# Patient Record
Sex: Female | Born: 1985 | Race: Black or African American | Hispanic: No | Marital: Single | Smoking: Current every day smoker
Health system: Southern US, Community
[De-identification: ages and names within clinical notes are randomized; demographics above are authoritative.]

## PROBLEM LIST (undated history)

## (undated) ENCOUNTER — Ambulatory Visit (HOSPITAL_COMMUNITY): Discharge status: Absent without leave | Payer: Medicaid Other

## (undated) ENCOUNTER — Emergency Department (HOSPITAL_COMMUNITY): Admission: EM | Payer: Medicaid Other | Source: Home / Self Care

## (undated) DIAGNOSIS — F32A Depression, unspecified: Secondary | ICD-10-CM

## (undated) DIAGNOSIS — C189 Malignant neoplasm of colon, unspecified: Secondary | ICD-10-CM

## (undated) DIAGNOSIS — K8020 Calculus of gallbladder without cholecystitis without obstruction: Secondary | ICD-10-CM

## (undated) DIAGNOSIS — Z1509 Genetic susceptibility to other malignant neoplasm: Secondary | ICD-10-CM

## (undated) DIAGNOSIS — J40 Bronchitis, not specified as acute or chronic: Secondary | ICD-10-CM

## (undated) DIAGNOSIS — Z800 Family history of malignant neoplasm of digestive organs: Secondary | ICD-10-CM

## (undated) DIAGNOSIS — Z801 Family history of malignant neoplasm of trachea, bronchus and lung: Secondary | ICD-10-CM

## (undated) DIAGNOSIS — F319 Bipolar disorder, unspecified: Secondary | ICD-10-CM

## (undated) DIAGNOSIS — I10 Essential (primary) hypertension: Secondary | ICD-10-CM

## (undated) DIAGNOSIS — F329 Major depressive disorder, single episode, unspecified: Secondary | ICD-10-CM

## (undated) DIAGNOSIS — Z808 Family history of malignant neoplasm of other organs or systems: Secondary | ICD-10-CM

## (undated) DIAGNOSIS — F1290 Cannabis use, unspecified, uncomplicated: Secondary | ICD-10-CM

## (undated) DIAGNOSIS — Z720 Tobacco use: Secondary | ICD-10-CM

## (undated) DIAGNOSIS — Z807 Family history of other malignant neoplasms of lymphoid, hematopoietic and related tissues: Secondary | ICD-10-CM

## (undated) HISTORY — PX: DENTAL SURGERY: SHX609

## (undated) HISTORY — DX: Genetic susceptibility to other malignant neoplasm: Z15.09

## (undated) HISTORY — DX: Family history of malignant neoplasm of digestive organs: Z80.0

## (undated) HISTORY — DX: Family history of malignant neoplasm of trachea, bronchus and lung: Z80.1

## (undated) HISTORY — DX: Family history of malignant neoplasm of other organs or systems: Z80.8

## (undated) HISTORY — DX: Family history of other malignant neoplasms of lymphoid, hematopoietic and related tissues: Z80.7

## (undated) HISTORY — DX: Malignant neoplasm of colon, unspecified: C18.9

## (undated) HISTORY — PX: DILATION AND CURETTAGE OF UTERUS: SHX78

---
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
## 2003-07-01 ENCOUNTER — Emergency Department (HOSPITAL_COMMUNITY): Admission: EM | Admit: 2003-07-01 | Discharge: 2003-07-02 | Payer: Self-pay | Admitting: Emergency Medicine

## 2003-07-07 ENCOUNTER — Encounter: Admission: RE | Admit: 2003-07-07 | Discharge: 2003-07-07 | Payer: Self-pay | Admitting: Internal Medicine

## 2003-10-07 ENCOUNTER — Emergency Department (HOSPITAL_COMMUNITY): Admission: EM | Admit: 2003-10-07 | Discharge: 2003-10-07 | Payer: Self-pay | Admitting: Emergency Medicine

## 2004-01-11 ENCOUNTER — Inpatient Hospital Stay (HOSPITAL_COMMUNITY): Admission: AD | Admit: 2004-01-11 | Discharge: 2004-01-11 | Payer: Self-pay | Admitting: *Deleted

## 2004-10-31 ENCOUNTER — Inpatient Hospital Stay (HOSPITAL_COMMUNITY): Admission: AD | Admit: 2004-10-31 | Discharge: 2004-10-31 | Payer: Self-pay | Admitting: *Deleted

## 2005-12-19 ENCOUNTER — Inpatient Hospital Stay (HOSPITAL_COMMUNITY): Admission: AD | Admit: 2005-12-19 | Discharge: 2005-12-19 | Payer: Self-pay | Admitting: Obstetrics and Gynecology

## 2005-12-26 ENCOUNTER — Inpatient Hospital Stay (HOSPITAL_COMMUNITY): Admission: AD | Admit: 2005-12-26 | Discharge: 2005-12-26 | Payer: Self-pay | Admitting: Obstetrics and Gynecology

## 2006-05-22 ENCOUNTER — Inpatient Hospital Stay (HOSPITAL_COMMUNITY): Admission: AD | Admit: 2006-05-22 | Discharge: 2006-05-22 | Payer: Self-pay | Admitting: Gynecology

## 2006-08-05 ENCOUNTER — Emergency Department (HOSPITAL_COMMUNITY): Admission: EM | Admit: 2006-08-05 | Discharge: 2006-08-05 | Payer: Self-pay | Admitting: Emergency Medicine

## 2007-06-03 ENCOUNTER — Emergency Department (HOSPITAL_COMMUNITY): Admission: EM | Admit: 2007-06-03 | Discharge: 2007-06-03 | Payer: Self-pay | Admitting: Emergency Medicine

## 2007-11-11 ENCOUNTER — Inpatient Hospital Stay (HOSPITAL_COMMUNITY): Admission: AD | Admit: 2007-11-11 | Discharge: 2007-11-11 | Payer: Self-pay | Admitting: Obstetrics & Gynecology

## 2008-06-04 ENCOUNTER — Emergency Department (HOSPITAL_COMMUNITY): Admission: EM | Admit: 2008-06-04 | Discharge: 2008-06-05 | Payer: Self-pay | Admitting: Emergency Medicine

## 2009-02-16 ENCOUNTER — Inpatient Hospital Stay (HOSPITAL_COMMUNITY): Admission: AD | Admit: 2009-02-16 | Discharge: 2009-02-16 | Payer: Self-pay | Admitting: Obstetrics & Gynecology

## 2009-02-18 ENCOUNTER — Inpatient Hospital Stay (HOSPITAL_COMMUNITY): Admission: AD | Admit: 2009-02-18 | Discharge: 2009-02-18 | Payer: Self-pay | Admitting: Family Medicine

## 2009-05-31 ENCOUNTER — Ambulatory Visit (HOSPITAL_COMMUNITY): Admission: RE | Admit: 2009-05-31 | Discharge: 2009-05-31 | Payer: Self-pay | Admitting: Obstetrics

## 2009-08-11 ENCOUNTER — Ambulatory Visit (HOSPITAL_COMMUNITY): Admission: RE | Admit: 2009-08-11 | Discharge: 2009-08-11 | Payer: Self-pay | Admitting: Obstetrics

## 2009-10-07 ENCOUNTER — Inpatient Hospital Stay (HOSPITAL_COMMUNITY): Admission: AD | Admit: 2009-10-07 | Discharge: 2009-10-07 | Payer: Self-pay | Admitting: Obstetrics & Gynecology

## 2009-10-13 ENCOUNTER — Inpatient Hospital Stay (HOSPITAL_COMMUNITY): Admission: AD | Admit: 2009-10-13 | Discharge: 2009-10-15 | Payer: Self-pay | Admitting: Obstetrics & Gynecology

## 2009-10-13 ENCOUNTER — Inpatient Hospital Stay (HOSPITAL_COMMUNITY): Admission: AD | Admit: 2009-10-13 | Discharge: 2009-10-13 | Payer: Self-pay | Admitting: Obstetrics

## 2010-07-10 ENCOUNTER — Emergency Department (HOSPITAL_COMMUNITY): Admission: EM | Admit: 2010-07-10 | Discharge: 2010-07-10 | Payer: Self-pay | Admitting: Family Medicine

## 2011-03-12 ENCOUNTER — Inpatient Hospital Stay (HOSPITAL_COMMUNITY): Admission: RE | Admit: 2011-03-12 | Payer: Medicaid Other | Source: Ambulatory Visit

## 2011-03-12 ENCOUNTER — Inpatient Hospital Stay (INDEPENDENT_AMBULATORY_CARE_PROVIDER_SITE_OTHER)
Admission: RE | Admit: 2011-03-12 | Discharge: 2011-03-12 | Disposition: A | Discharge status: Home / Self Care | Payer: Self-pay | Source: Ambulatory Visit | Attending: Emergency Medicine | Admitting: Emergency Medicine

## 2011-03-12 DIAGNOSIS — N390 Urinary tract infection, site not specified: Secondary | ICD-10-CM

## 2011-03-12 LAB — POCT URINALYSIS DIP (DEVICE)
Bilirubin Urine: NEGATIVE
Ketones, ur: NEGATIVE mg/dL
Specific Gravity, Urine: 1.025 (ref 1.005–1.030)
pH: 6 (ref 5.0–8.0)

## 2011-03-14 LAB — URINE CULTURE
Colony Count: >=100000
Culture  Setup Time: 201203262110

## 2011-03-22 LAB — CBC
HCT: 26 % — ABNORMAL LOW (ref 36.0–46.0)
HCT: 29.9 % — ABNORMAL LOW (ref 36.0–46.0)
Hemoglobin: 10.1 g/dL — ABNORMAL LOW (ref 12.0–15.0)
MCHC: 33.1 g/dL (ref 30.0–36.0)
MCHC: 33.8 g/dL (ref 30.0–36.0)
MCV: 84.9 fL (ref 78.0–100.0)
Platelets: 270 10*3/uL (ref 150–400)
Platelets: 389 10*3/uL (ref 150–400)
RBC: 3.9 MIL/uL (ref 3.87–5.11)
RDW: 12.7 % (ref 11.5–15.5)
RDW: 13.1 % (ref 11.5–15.5)
WBC: 12.5 10*3/uL — ABNORMAL HIGH (ref 4.0–10.5)

## 2011-03-22 LAB — URINALYSIS, ROUTINE W REFLEX MICROSCOPIC
Bilirubin Urine: NEGATIVE
Nitrite: NEGATIVE
Protein, ur: NEGATIVE mg/dL
Specific Gravity, Urine: 1.015 (ref 1.005–1.030)
Urobilinogen, UA: 0.2 mg/dL (ref 0.0–1.0)

## 2011-03-22 LAB — COMPREHENSIVE METABOLIC PANEL
Alkaline Phosphatase: 139 U/L — ABNORMAL HIGH (ref 39–117)
BUN: 2 mg/dL — ABNORMAL LOW (ref 6–23)
CO2: 20 mEq/L (ref 19–32)
Calcium: 8.9 mg/dL (ref 8.4–10.5)
GFR calc non Af Amer: >60 mL/min (ref >60)
Glucose, Bld: 81 mg/dL (ref 70–99)
Total Protein: 6.6 g/dL (ref 6.0–8.3)

## 2011-03-22 LAB — RPR: RPR Ser Ql: NONREACTIVE

## 2011-03-22 LAB — LACTATE DEHYDROGENASE: LDH: 86 U/L — ABNORMAL LOW (ref 94–250)

## 2011-03-22 LAB — URIC ACID: Uric Acid, Serum: 4.5 mg/dL (ref 2.4–7.0)

## 2011-03-29 LAB — URINALYSIS, ROUTINE W REFLEX MICROSCOPIC
Nitrite: NEGATIVE
Protein, ur: NEGATIVE mg/dL
Specific Gravity, Urine: >1.03 — ABNORMAL HIGH (ref 1.005–1.030)
Urobilinogen, UA: 0.2 mg/dL (ref 0.0–1.0)

## 2011-03-29 LAB — CBC
HCT: 39.7 % (ref 36.0–46.0)
Hemoglobin: 13.2 g/dL (ref 12.0–15.0)
MCHC: 33.2 g/dL (ref 30.0–36.0)
RBC: 4.55 MIL/uL (ref 3.87–5.11)
RDW: 12.6 % (ref 11.5–15.5)

## 2011-03-29 LAB — GC/CHLAMYDIA PROBE AMP, GENITAL
Chlamydia, DNA Probe: NEGATIVE
GC Probe Amp, Genital: NEGATIVE

## 2011-03-29 LAB — WET PREP, GENITAL: Clue Cells Wet Prep HPF POC: NONE SEEN

## 2011-03-29 LAB — POCT PREGNANCY, URINE: Preg Test, Ur: POSITIVE

## 2011-03-29 LAB — ABO/RH: ABO/RH(D): O POS

## 2011-08-06 ENCOUNTER — Inpatient Hospital Stay (HOSPITAL_COMMUNITY)
Admission: AD | Admit: 2011-08-06 | Discharge: 2011-08-06 | Disposition: A | Discharge status: Home / Self Care | Payer: Self-pay | Source: Ambulatory Visit | Attending: Obstetrics | Admitting: Obstetrics

## 2011-09-13 LAB — POCT PREGNANCY, URINE
Operator id: 272551
Preg Test, Ur: NEGATIVE

## 2011-09-25 LAB — URINALYSIS, ROUTINE W REFLEX MICROSCOPIC
Glucose, UA: NEGATIVE
Hgb urine dipstick: NEGATIVE
Protein, ur: NEGATIVE

## 2011-09-25 LAB — URINE MICROSCOPIC-ADD ON

## 2011-09-25 LAB — POCT PREGNANCY, URINE: Preg Test, Ur: NEGATIVE

## 2011-09-25 LAB — WET PREP, GENITAL

## 2011-09-25 LAB — GC/CHLAMYDIA PROBE AMP, GENITAL: Chlamydia, DNA Probe: NEGATIVE

## 2011-10-03 LAB — STREP A DNA PROBE: Group A Strep Probe: NEGATIVE

## 2011-10-03 LAB — RAPID STREP SCREEN (MED CTR MEBANE ONLY): Streptococcus, Group A Screen (Direct): NEGATIVE

## 2012-11-19 ENCOUNTER — Emergency Department (HOSPITAL_COMMUNITY)
Admission: EM | Admit: 2012-11-19 | Discharge: 2012-11-19 | Disposition: A | Discharge status: Home / Self Care | Payer: Self-pay | Attending: Emergency Medicine | Admitting: Emergency Medicine

## 2012-11-19 ENCOUNTER — Encounter (HOSPITAL_COMMUNITY): Payer: Self-pay

## 2012-11-19 DIAGNOSIS — R498 Other voice and resonance disorders: Secondary | ICD-10-CM | POA: Insufficient documentation

## 2012-11-19 DIAGNOSIS — F17200 Nicotine dependence, unspecified, uncomplicated: Secondary | ICD-10-CM | POA: Insufficient documentation

## 2012-11-19 DIAGNOSIS — H9209 Otalgia, unspecified ear: Secondary | ICD-10-CM | POA: Insufficient documentation

## 2012-11-19 DIAGNOSIS — J029 Acute pharyngitis, unspecified: Secondary | ICD-10-CM | POA: Insufficient documentation

## 2012-11-19 DIAGNOSIS — B9789 Other viral agents as the cause of diseases classified elsewhere: Secondary | ICD-10-CM

## 2012-11-19 DIAGNOSIS — R51 Headache: Secondary | ICD-10-CM | POA: Insufficient documentation

## 2012-11-19 MED ORDER — HYDROCODONE-ACETAMINOPHEN 7.5-500 MG/15ML PO SOLN: 15.0000 mL | Freq: Four times a day (QID) | ORAL | Status: DC | PRN

## 2012-11-19 NOTE — ED Provider Notes (Signed)
Medical screening examination/treatment/procedure(s) were performed by non-physician practitioner and as supervising physician I was immediately available for consultation/collaboration.  Brayden Brodhead, MD 11/19/12 1707 

## 2012-11-19 NOTE — ED Provider Notes (Signed)
History     CSN: [id number]  Arrival date & time 11/19/12  4098   First MD Initiated Contact with Patient 11/19/12 [phone]      Chief Complaint  Patient presents with  . Otalgia  . Sore Throat    (Consider location/radiation/quality/duration/timing/severity/associated sxs/prior treatment) HPI  26 year old female presents complaining of sore throat.  Patient describes gradual onset of sore throat for the past 2 weeks worse in the past week pain is to both side of throat radiates up to her ears describe pain as a achy throbbing sensation with swallowing.  Pain mildly improved with over-the-counter pain medication and with gargle salt water.  As fever, chills, runny nose, sneezing, cough, chest pain or shortness of breath, abdominal pain or rash.  No dental pain but endorse gum pain.    History reviewed. No pertinent past medical history.  History reviewed. No pertinent past surgical history.  Family History  Problem Relation Age of Onset  . Hypertension Mother     History  Substance Use Topics  . Smoking status: Current Every Day Smoker  . Smokeless tobacco: Never Used  . Alcohol Use: Yes     Comment: weekends only    OB History    Grav Para Term Preterm Abortions TAB SAB Ect Mult Living                  Review of Systems  Constitutional: Negative for fever, chills and appetite change.  HENT: Positive for ear pain, sore throat, trouble swallowing and voice change. Negative for hearing loss, facial swelling, sneezing, drooling, neck pain, neck stiffness, dental problem, sinus pressure, tinnitus and ear discharge.   Respiratory: Negative for chest tightness and shortness of breath.   Cardiovascular: Negative for chest pain.  Gastrointestinal: Negative for abdominal pain.  Skin: Negative for rash.  Neurological: Positive for headaches. Negative for light-headedness.    Allergies  Penicillins  Home Medications   Current Outpatient Rx  Name  Route  Sig  Dispense   Refill  . GABAPENTIN 100 MG PO CAPS   Oral   Take 200 mg by mouth daily.         . QUETIAPINE FUMARATE 400 MG PO TABS   Oral   Take 400 mg by mouth at bedtime.           BP 128/72  Pulse 88  Temp 98.1 F (36.7 C) (Oral)  Resp 18  SpO2 98%  LMP 10/29/2012  Physical Exam  Nursing note and vitals reviewed. Constitutional: She is oriented to person, place, and time. She appears well-developed and well-nourished. No distress.  HENT:  Head: Normocephalic and atraumatic.  Right Ear: External ear normal.  Left Ear: External ear normal.  Nose: Nose normal.  Mouth/Throat: Oropharynx is clear and moist.       tonsilar enlargement with exudates bilaterally.  Uvula midline, no evidence of PTA, ludwigs angina, or deep tissue infection.  No dental pain and gum tenderness.  Voice mildly hoarsed.   Eyes: Conjunctivae normal and EOM are normal. Pupils are equal, round, and reactive to light.  Neck: Normal range of motion. Neck supple.  Cardiovascular: Normal rate and regular rhythm.   Pulmonary/Chest: Effort normal and breath sounds normal. No stridor. No respiratory distress. She has no wheezes. She exhibits no tenderness.  Abdominal: Soft. There is no tenderness.  Musculoskeletal: Normal range of motion.  Lymphadenopathy:    She has no cervical adenopathy.  Neurological: She is alert and oriented to person, place, and time.  Skin: Skin is warm. No rash noted.  Psychiatric: She has a normal mood and affect.    ED Course  Procedures (including critical care time)  Labs Reviewed - No data to display No results found.   No diagnosis found.  Results for orders placed during the hospital encounter of 11/19/12  RAPID STREP SCREEN      Component Value Range   Streptococcus, Group A Screen (Direct) NEGATIVE  NEGATIVE   No results found.  1. Sore throat  MDM  2 weeks onset of sore throats and ear pain.  Throat mildly erythematous with bilateral tonsilar exudates.  No airway  compromise.  Will check rapid strep test.    10:08 AM Strep neg.  Will prescribe Lortab and encourage salt water gargle.  ENT referral given as needed.  Pt otherwise stable for discharge.  BP 128/72  Pulse 88  Temp 98.1 F (36.7 C) (Oral)  Resp 18  SpO2 98%  LMP 10/29/2012  I have reviewed nursing notes and vital signs.  I reviewed available ER/hospitalization records thought the EMR       Fayrene Helper, [place] 11/19/12 1009

## 2012-11-19 NOTE — ED Notes (Signed)
Patient reports that she has redness and c/o sore throat, earache bilaterally. Patient also c/o pain to her gums. Patient also reports that she went to an Urgent Care and was told to consult with a dentist a month ago.

## 2012-11-26 ENCOUNTER — Encounter (HOSPITAL_COMMUNITY): Payer: Self-pay | Admitting: *Deleted

## 2012-11-26 ENCOUNTER — Emergency Department (HOSPITAL_COMMUNITY)
Admission: EM | Admit: 2012-11-26 | Discharge: 2012-11-26 | Disposition: A | Discharge status: Home / Self Care | Payer: Self-pay | Attending: Emergency Medicine | Admitting: Emergency Medicine

## 2012-11-26 DIAGNOSIS — R51 Headache: Secondary | ICD-10-CM | POA: Insufficient documentation

## 2012-11-26 DIAGNOSIS — K089 Disorder of teeth and supporting structures, unspecified: Secondary | ICD-10-CM | POA: Insufficient documentation

## 2012-11-26 DIAGNOSIS — F17200 Nicotine dependence, unspecified, uncomplicated: Secondary | ICD-10-CM | POA: Insufficient documentation

## 2012-11-26 DIAGNOSIS — K0889 Other specified disorders of teeth and supporting structures: Secondary | ICD-10-CM

## 2012-11-26 MED ORDER — HYDROCODONE-ACETAMINOPHEN 5-325 MG PO TABS: 1.0000 | ORAL_TABLET | Freq: Four times a day (QID) | ORAL | Status: DC | PRN

## 2012-11-26 NOTE — ED Notes (Signed)
Pt was just told that she has to have all 4 wisdom tooth pulled but cannot afford it and has been having a headache for one month, but rest of body feels fine

## 2012-11-26 NOTE — ED Provider Notes (Signed)
History     CSN: [id number]  Arrival date & time 11/26/12  3875   First MD Initiated Contact with Patient 11/26/12 0848      Chief Complaint  Patient presents with  . Headache  . Dental Pain    (Consider location/radiation/quality/duration/timing/severity/associated sxs/prior treatment) HPI Comments: This is a 26 year old female, who presents emergency department with chief complaint of dental pain. Patient states that she was recently seen by a dentist and was told that she would need to have her wisdom teeth extracted. Patient states that she cannot afford to have this done, and that it has been causing her mouth pain and headache for one month. She states that the pain is moderate to severe, and is worse when she tries to sleep at night. She has tried taking pain medicine to alleviate her symptoms, with some relief. Nothing makes her symptoms worse or better.  The history is provided by the patient. No language interpreter was used.    History reviewed. No pertinent past medical history.  History reviewed. No pertinent past surgical history.  Family History  Problem Relation Age of Onset  . Hypertension Mother     History  Substance Use Topics  . Smoking status: Current Every Day Smoker  . Smokeless tobacco: Never Used  . Alcohol Use: Yes     Comment: weekends only    OB History    Grav Para Term Preterm Abortions TAB SAB Ect Mult Living                  Review of Systems  All other systems reviewed and are negative.    Allergies  Penicillins  Home Medications   Current Outpatient Rx  Name  Route  Sig  Dispense  Refill  . GABAPENTIN 100 MG PO CAPS   Oral   Take 200 mg by mouth daily.         Marland Kitchen HYDROCODONE-ACETAMINOPHEN 7.5-500 MG/15ML PO SOLN   Oral   Take 15 mLs by mouth every 6 (six) hours as needed for pain.   120 mL   0   . QUETIAPINE FUMARATE 400 MG PO TABS   Oral   Take 400 mg by mouth at bedtime.           BP 147/89  Pulse 85   Temp 98.2 F (36.8 C) (Oral)  Resp 20  SpO2 99%  LMP 10/29/2012  Physical Exam  Nursing note and vitals reviewed. Constitutional: She is oriented to person, place, and time. She appears well-developed and well-nourished.  HENT:  Head: Normocephalic and atraumatic.       Rear molars, are impacting against the neighboring molars, no signs of infection, no tonsillar or peritonsillar abscesses. No signs of Ludwig angina. Airway is intact.  Eyes: Conjunctivae normal and EOM are normal. Pupils are equal, round, and reactive to light.  Neck: Normal range of motion. Neck supple.  Cardiovascular: Normal rate and regular rhythm.  Exam reveals no gallop and no friction rub.   No murmur heard. Pulmonary/Chest: Effort normal and breath sounds normal. No respiratory distress. She has no wheezes. She has no rales. She exhibits no tenderness.  Abdominal: Soft. Bowel sounds are normal. She exhibits no distension and no mass. There is no tenderness. There is no rebound and no guarding.  Musculoskeletal: Normal range of motion. She exhibits no edema and no tenderness.  Neurological: She is alert and oriented to person, place, and time.  Skin: Skin is warm and dry.  Psychiatric: She  has a normal mood and affect. Her behavior is normal. Judgment and thought content normal.    ED Course  Procedures (including critical care time)  Labs Reviewed - No data to display No results found.   1. Pain, dental       MDM  26 year old female with dental pain. I told the patient that she is to followup dentists recommendations and have her wisdom teeth extracted. Have given her a referral to an oral surgeon, as well as the resource guide was doing several chief dental clinics. Advised her to call the dental clinics in any of them will extracted teeth, but told her that she would likely need to see an oral surgeon. I informed her that I would give her pain medicines today, but that she will not be able to  continually returns to the emergency department for pain medicine. She needs to have definitive care for her teeth. She understands and agrees with the plan. Patient is stable and ready for discharge.        Roxy Horseman, PA-C 11/26/12 1132

## 2012-11-26 NOTE — ED Provider Notes (Signed)
Medical screening examination/treatment/procedure(s) were performed by non-physician practitioner and as supervising physician I was immediately available for consultation/collaboration.   Carleene Cooper III, MD 11/26/12 2104

## 2013-01-05 ENCOUNTER — Encounter (HOSPITAL_COMMUNITY): Payer: Self-pay | Admitting: Emergency Medicine

## 2013-01-05 ENCOUNTER — Emergency Department (HOSPITAL_COMMUNITY)
Admission: EM | Admit: 2013-01-05 | Discharge: 2013-01-05 | Disposition: A | Discharge status: Home / Self Care | Payer: Self-pay | Attending: Emergency Medicine | Admitting: Emergency Medicine

## 2013-01-05 ENCOUNTER — Emergency Department (HOSPITAL_COMMUNITY): Payer: Self-pay

## 2013-01-05 DIAGNOSIS — F17200 Nicotine dependence, unspecified, uncomplicated: Secondary | ICD-10-CM | POA: Insufficient documentation

## 2013-01-05 DIAGNOSIS — R109 Unspecified abdominal pain: Secondary | ICD-10-CM | POA: Insufficient documentation

## 2013-01-05 DIAGNOSIS — J029 Acute pharyngitis, unspecified: Secondary | ICD-10-CM | POA: Insufficient documentation

## 2013-01-05 DIAGNOSIS — J069 Acute upper respiratory infection, unspecified: Secondary | ICD-10-CM | POA: Insufficient documentation

## 2013-01-05 DIAGNOSIS — R5383 Other fatigue: Secondary | ICD-10-CM | POA: Insufficient documentation

## 2013-01-05 DIAGNOSIS — R062 Wheezing: Secondary | ICD-10-CM | POA: Insufficient documentation

## 2013-01-05 DIAGNOSIS — Z79899 Other long term (current) drug therapy: Secondary | ICD-10-CM | POA: Insufficient documentation

## 2013-01-05 DIAGNOSIS — F3289 Other specified depressive episodes: Secondary | ICD-10-CM | POA: Insufficient documentation

## 2013-01-05 DIAGNOSIS — R059 Cough, unspecified: Secondary | ICD-10-CM | POA: Insufficient documentation

## 2013-01-05 DIAGNOSIS — R05 Cough: Secondary | ICD-10-CM | POA: Insufficient documentation

## 2013-01-05 DIAGNOSIS — Z3202 Encounter for pregnancy test, result negative: Secondary | ICD-10-CM | POA: Insufficient documentation

## 2013-01-05 DIAGNOSIS — R5381 Other malaise: Secondary | ICD-10-CM | POA: Insufficient documentation

## 2013-01-05 DIAGNOSIS — F329 Major depressive disorder, single episode, unspecified: Secondary | ICD-10-CM | POA: Insufficient documentation

## 2013-01-05 HISTORY — DX: Depression, unspecified: F32.A

## 2013-01-05 HISTORY — DX: Major depressive disorder, single episode, unspecified: F32.9

## 2013-01-05 LAB — URINALYSIS, MICROSCOPIC ONLY
Glucose, UA: NEGATIVE mg/dL
Hgb urine dipstick: NEGATIVE
Nitrite: NEGATIVE
Specific Gravity, Urine: 1.021 (ref 1.005–1.030)

## 2013-01-05 LAB — PREGNANCY, URINE: Preg Test, Ur: NEGATIVE

## 2013-01-05 LAB — CBC WITH DIFFERENTIAL/PLATELET
Basophils Absolute: 0 10*3/uL (ref 0.0–0.1)
Eosinophils Absolute: 0.1 10*3/uL (ref 0.0–0.7)
Eosinophils Relative: 2 % (ref 0–5)
MCH: 28.8 pg (ref 26.0–34.0)
MCHC: 34.1 g/dL (ref 30.0–36.0)
MCV: 84.6 fL (ref 78.0–100.0)
Platelets: 303 10*3/uL (ref 150–400)
RDW: 12.6 % (ref 11.5–15.5)
WBC: 5.3 10*3/uL (ref 4.0–10.5)

## 2013-01-05 LAB — COMPREHENSIVE METABOLIC PANEL
ALT: 17 U/L (ref 0–35)
AST: 20 U/L (ref 0–37)
Calcium: 9.1 mg/dL (ref 8.4–10.5)
GFR calc Af Amer: >90 mL/min (ref >90)
Sodium: 134 mEq/L — ABNORMAL LOW (ref 135–145)
Total Protein: 7.4 g/dL (ref 6.0–8.3)

## 2013-01-05 MED ORDER — BENZONATATE 100 MG PO CAPS: 100.0000 mg | ORAL_CAPSULE | Freq: Three times a day (TID) | ORAL | Status: DC

## 2013-01-05 MED ORDER — ONDANSETRON 8 MG PO TBDP: 8.0000 mg | ORAL_TABLET | Freq: Three times a day (TID) | ORAL | Status: DC | PRN

## 2013-01-05 MED ORDER — ALBUTEROL SULFATE HFA 108 (90 BASE) MCG/ACT IN AERS: 1.0000 | INHALATION_SPRAY | Freq: Four times a day (QID) | RESPIRATORY_TRACT | Status: DC | PRN

## 2013-01-05 MED ORDER — ALBUTEROL SULFATE HFA 108 (90 BASE) MCG/ACT IN AERS
INHALATION_SPRAY | RESPIRATORY_TRACT | Status: AC
  Filled 2013-01-05: qty 6.7

## 2013-01-05 NOTE — ED Notes (Addendum)
Pt presenting to ed with c/o abdominal pain with body aches. Pt denies nausea, vomiting and diarrhea at this time. Pt denies dysuria pt states positive back pain. Pt states positive cough and congestion

## 2013-01-05 NOTE — ED Notes (Signed)
Pt presents to ed with c/o cough and chest congestion as well as lower abdominal pain and vaginal discharge. Pt denies fever, n/v. Expiratory wheezing present bilateral upper lobes.

## 2013-01-05 NOTE — ED Provider Notes (Signed)
History     CSN: [id number]  Arrival date & time 01/05/13  1127   First MD Initiated Contact with Patient 01/05/13 1250      Chief Complaint  Patient presents with  . Generalized Body Aches  . Abdominal Pain    HPI Two days ago pt woke up with coughing and wheezing.  She has been having trouble with wheezing and sore throat.  Her chest is hurting.  It increases with cough.  She has had nasal congestion and rhinitis  A few days prior to that she had some abdominal cramping as well.  No vomiting or diarrhea.    Nothing makes it particularly better or worse. Symptoms are mild to moderate  Past Medical History  Diagnosis Date  . Depression     History reviewed. No pertinent past surgical history.  Family History  Problem Relation Age of Onset  . Hypertension Mother     History  Substance Use Topics  . Smoking status: Current Every Day Smoker -- 0.5 packs/day    Types: Cigarettes  . Smokeless tobacco: Never Used  . Alcohol Use: Yes     Comment: weekends only    OB History    Grav Para Term Preterm Abortions TAB SAB Ect Mult Living                  Review of Systems  Constitutional: Positive for fatigue.  HENT: Negative for ear pain.   Respiratory: Positive for cough.   Genitourinary: Negative for vaginal discharge (Patient denies any significant vaginal discharge, she states she checks for that).  All other systems reviewed and are negative.    Allergies  Penicillins  Home Medications   Current Outpatient Rx  Name  Route  Sig  Dispense  Refill  . GABAPENTIN 100 MG PO CAPS   Oral   Take 200 mg by mouth daily.         Marland Kitchen HYDROCODONE-ACETAMINOPHEN 5-325 MG PO TABS   Oral   Take 1 tablet by mouth every 6 (six) hours as needed for pain.   15 tablet   0   . QUETIAPINE FUMARATE 400 MG PO TABS   Oral   Take 400 mg by mouth at bedtime.           BP 140/67  Pulse 91  Temp 98.7 F (37.1 C) (Oral)  Resp 18  SpO2 100%  Physical Exam  Nursing  note and vitals reviewed. Constitutional: She appears well-developed and well-nourished. No distress.  HENT:  Head: Normocephalic and atraumatic.  Right Ear: External ear normal.  Left Ear: External ear normal.  Mouth/Throat: Posterior oropharyngeal erythema present. No oropharyngeal exudate or posterior oropharyngeal edema.  Eyes: Conjunctivae normal are normal. Right eye exhibits no discharge. Left eye exhibits no discharge. No scleral icterus.  Neck: Neck supple. No tracheal deviation present.  Cardiovascular: Normal rate, regular rhythm and intact distal pulses.   Pulmonary/Chest: Effort normal and breath sounds normal. No stridor. No respiratory distress. She has no wheezes. She has no rales. She exhibits no tenderness.  Abdominal: Soft. Bowel sounds are normal. She exhibits no distension. There is no tenderness. There is no rebound and no guarding.  Musculoskeletal: She exhibits no edema and no tenderness.  Neurological: She is alert. She has normal strength. No sensory deficit. Cranial nerve deficit:  no gross defecits noted. She exhibits normal muscle tone. She displays no seizure activity. Coordination normal.  Skin: Skin is warm and dry. No rash noted.  Psychiatric: She  has a normal mood and affect.    ED Course  Procedures (including critical care time)  Labs Reviewed  CBC WITH DIFFERENTIAL - Abnormal; Notable for the following:    Neutrophils Relative 39 (*)     Monocytes Relative 15 (*)     All other components within normal limits  COMPREHENSIVE METABOLIC PANEL - Abnormal; Notable for the following:    Sodium 134 (*)     Total Bilirubin 0.2 (*)     All other components within normal limits  URINALYSIS, MICROSCOPIC ONLY - Abnormal; Notable for the following:    APPearance CLOUDY (*)     Leukocytes, UA SMALL (*)     Bacteria, UA MANY (*)     Squamous Epithelial / LPF MANY (*)     All other components within normal limits  LIPASE, BLOOD  PREGNANCY, URINE   Dg Chest 2  View  01/05/2013  *RADIOLOGY REPORT*  Clinical Data: Cough  CHEST - 2 VIEW  Comparison: 06/04/2008  Findings: Cardiomediastinal silhouette is stable.  No acute infiltrate or pleural effusion.  No pulmonary edema.  Central mild bronchitic changes.  IMPRESSION: No acute infiltrate or pulmonary edema.  Central mild bronchitic changes.   Original Report Authenticated By: Natasha Mead, M.D.      1. URI, acute       MDM  Patient most likely has a viral illness. She has no evidence of pneumonia on chest x-ray. During my exam I did not auscultate any wheezing nursing had noted earlier.  Patient had been complaining of some abdominal cramping. she has no tenderness on exam. This is most likely related to the viral illness that she is having. I suggested she followup with a primary care Dr. if her symptoms do not resolve.          Celene Kras, MD 01/05/13 [phone]

## 2013-01-05 NOTE — ED Notes (Signed)
Called lab to check on delay, they said UA should be done within next 5 min.

## 2013-01-07 LAB — URINE CULTURE: Colony Count: 30000

## 2013-02-26 ENCOUNTER — Encounter (HOSPITAL_COMMUNITY): Payer: Self-pay | Admitting: *Deleted

## 2013-02-26 ENCOUNTER — Inpatient Hospital Stay (HOSPITAL_COMMUNITY)
Admission: AD | Admit: 2013-02-26 | Discharge: 2013-02-27 | Disposition: A | Discharge status: Home / Self Care | Payer: Self-pay | Source: Ambulatory Visit | Attending: Obstetrics and Gynecology | Admitting: Obstetrics and Gynecology

## 2013-02-26 DIAGNOSIS — M545 Low back pain, unspecified: Secondary | ICD-10-CM | POA: Insufficient documentation

## 2013-02-26 DIAGNOSIS — N72 Inflammatory disease of cervix uteri: Secondary | ICD-10-CM | POA: Insufficient documentation

## 2013-02-26 DIAGNOSIS — R109 Unspecified abdominal pain: Secondary | ICD-10-CM | POA: Insufficient documentation

## 2013-02-26 DIAGNOSIS — N949 Unspecified condition associated with female genital organs and menstrual cycle: Secondary | ICD-10-CM | POA: Insufficient documentation

## 2013-02-26 LAB — URINALYSIS, ROUTINE W REFLEX MICROSCOPIC
Bilirubin Urine: NEGATIVE
Hgb urine dipstick: NEGATIVE
Protein, ur: NEGATIVE mg/dL
Urobilinogen, UA: 0.2 mg/dL (ref 0.0–1.0)

## 2013-02-26 MED ORDER — KETOROLAC TROMETHAMINE 60 MG/2ML IM SOLN
60.0000 mg | Freq: Once | INTRAMUSCULAR | Status: AC
  Administered 2013-02-27: 60 mg via INTRAMUSCULAR
  Filled 2013-02-26: qty 2

## 2013-02-26 NOTE — MAU Note (Signed)
Pt states she has pressure and discomfort abover her pubic bone and discomfort with urinating and vaginal odor and also wants to be checked for STDS

## 2013-02-26 NOTE — MAU Provider Note (Signed)
History     CSN: [id number]  Arrival date and time: 02/26/13 3086   First Provider Initiated Contact with Patient 02/26/13 2351      Chief Complaint  Patient presents with  . Abdominal Pain   HPI  Pt is not pregnant and presents with abdominal  Pain and pressure with lower back pain along with feeling frequency of urination every 30-27min for 3 days, voiding qs. The pressure has been about 3 weeks.  She has RCM. Pt states she has a vaginal discharge with odor- no itching or burning; pt denies fever or chills, constipation or diarrhea. Pt is concerned about STDs  Past Medical History  Diagnosis Date  . Depression     Past Surgical History  Procedure Laterality Date  . Dilation and curettage of uterus      Family History  Problem Relation Age of Onset  . Hypertension Mother     History  Substance Use Topics  . Smoking status: Current Every Day Smoker -- 0.50 packs/day    Types: Cigarettes  . Smokeless tobacco: Never Used  . Alcohol Use: Yes     Comment: weekends only    Allergies:  Allergies  Allergen Reactions  . Penicillins Other (See Comments)    Boils     Prescriptions prior to admission  Medication Sig Dispense Refill  . albuterol (PROVENTIL HFA;VENTOLIN HFA) 108 (90 BASE) MCG/ACT inhaler Inhale 1-2 puffs into the lungs every 6 (six) hours as needed for wheezing.  1 Inhaler  0  . gabapentin (NEURONTIN) 100 MG capsule Take 200 mg by mouth daily.      . QUEtiapine (SEROQUEL) 400 MG tablet Take 400 mg by mouth at bedtime.      . benzonatate (TESSALON) 100 MG capsule Take 1 capsule (100 mg total) by mouth every 8 (eight) hours.  21 capsule  0  . HYDROcodone-acetaminophen (NORCO/VICODIN) 5-325 MG per tablet Take 1 tablet by mouth every 6 (six) hours as needed for pain.  15 tablet  0  . ondansetron (ZOFRAN ODT) 8 MG disintegrating tablet Take 1 tablet (8 mg total) by mouth every 8 (eight) hours as needed for nausea.  20 tablet  0    Review of Systems   Constitutional: Negative for fever and chills.  Gastrointestinal: Positive for abdominal pain. Negative for diarrhea and constipation.  Genitourinary: Positive for frequency. Negative for dysuria and urgency.   Physical Exam   Blood pressure 146/94, pulse 82, temperature 98.1 F (36.7 C), temperature source Oral, resp. rate 20, height 5\' 3"  (1.6 m), weight 213 lb 6 oz (96.786 kg), last menstrual period 02/13/2013, SpO2 100.00%.  Physical Exam  Nursing note and vitals reviewed. Constitutional: She is oriented to person, place, and time. She appears well-developed and well-nourished.  HENT:  Head: Normocephalic.  Neck: Normal range of motion. Neck supple.  Cardiovascular: Normal rate.   Respiratory: Effort normal.  GI: Soft. She exhibits no distension. There is no tenderness. There is no rebound and no guarding.  Genitourinary:  Mod amount of frothy white discharge in vault; cervix tender +CMT; bimanual diffusely tender without palpable enlargement  Musculoskeletal: Normal range of motion.  Neurological: She is alert and oriented to person, place, and time.  Skin: Skin is warm and dry.  Psychiatric: She has a normal mood and affect.    MAU Course  Procedures  Results for orders placed during the hospital encounter of 02/26/13 (from the past 24 hour(s))  URINALYSIS, ROUTINE W REFLEX MICROSCOPIC     Status:  Abnormal   Collection Time    02/26/13  8:30 PM      Result Value Range   Color, Urine YELLOW  YELLOW   APPearance CLEAR  CLEAR   Specific Gravity, Urine >1.030 (*) 1.005 - 1.030   pH 5.5  5.0 - 8.0   Glucose, UA NEGATIVE  NEGATIVE mg/dL   Hgb urine dipstick NEGATIVE  NEGATIVE   Bilirubin Urine NEGATIVE  NEGATIVE   Ketones, ur NEGATIVE  NEGATIVE mg/dL   Protein, ur NEGATIVE  NEGATIVE mg/dL   Urobilinogen, UA 0.2  0.0 - 1.0 mg/dL   Nitrite NEGATIVE  NEGATIVE   Leukocytes, UA NEGATIVE  NEGATIVE  POCT PREGNANCY, URINE     Status: None   Collection Time    02/26/13  8:49  PM      Result Value Range   Preg Test, Ur NEGATIVE  NEGATIVE   Pt left before her labs available- pt to call back for results of wet prep Lab unable to process wet prep Pt treated for cervicitis while in MAU GC/Chlamydia pending- pt to be notified if positive Assessment and Plan  Cervicitis- treated in MAU with Rocephin and Zithromax   Amy Yoder 02/26/2013, 11:51 PM

## 2013-02-27 ENCOUNTER — Telehealth (HOSPITAL_COMMUNITY): Payer: Self-pay | Admitting: *Deleted

## 2013-02-27 MED ORDER — AZITHROMYCIN 250 MG PO TABS
1000.0000 mg | ORAL_TABLET | Freq: Once | ORAL | Status: AC
  Administered 2013-02-27: 1000 mg via ORAL
  Filled 2013-02-27: qty 4

## 2013-02-27 MED ORDER — CEFTRIAXONE SODIUM 250 MG IJ SOLR
250.0000 mg | Freq: Once | INTRAMUSCULAR | Status: AC
  Administered 2013-02-27: 250 mg via INTRAMUSCULAR
  Filled 2013-02-27: qty 250

## 2013-05-21 ENCOUNTER — Encounter (HOSPITAL_COMMUNITY): Payer: Self-pay | Admitting: Emergency Medicine

## 2013-05-21 ENCOUNTER — Emergency Department (HOSPITAL_COMMUNITY): Payer: Self-pay

## 2013-05-21 ENCOUNTER — Emergency Department (HOSPITAL_COMMUNITY)
Admission: EM | Admit: 2013-05-21 | Discharge: 2013-05-21 | Disposition: A | Discharge status: Home / Self Care | Payer: Self-pay | Attending: Emergency Medicine | Admitting: Emergency Medicine

## 2013-05-21 DIAGNOSIS — Z79899 Other long term (current) drug therapy: Secondary | ICD-10-CM | POA: Insufficient documentation

## 2013-05-21 DIAGNOSIS — R109 Unspecified abdominal pain: Secondary | ICD-10-CM | POA: Insufficient documentation

## 2013-05-21 DIAGNOSIS — Z3202 Encounter for pregnancy test, result negative: Secondary | ICD-10-CM | POA: Insufficient documentation

## 2013-05-21 DIAGNOSIS — Z8709 Personal history of other diseases of the respiratory system: Secondary | ICD-10-CM | POA: Insufficient documentation

## 2013-05-21 DIAGNOSIS — R0602 Shortness of breath: Secondary | ICD-10-CM | POA: Insufficient documentation

## 2013-05-21 DIAGNOSIS — F329 Major depressive disorder, single episode, unspecified: Secondary | ICD-10-CM | POA: Insufficient documentation

## 2013-05-21 DIAGNOSIS — F17200 Nicotine dependence, unspecified, uncomplicated: Secondary | ICD-10-CM | POA: Insufficient documentation

## 2013-05-21 DIAGNOSIS — F3289 Other specified depressive episodes: Secondary | ICD-10-CM | POA: Insufficient documentation

## 2013-05-21 HISTORY — DX: Bronchitis, not specified as acute or chronic: J40

## 2013-05-21 LAB — CBC WITH DIFFERENTIAL/PLATELET
Eosinophils Relative: 3 % (ref 0–5)
HCT: 39 % (ref 36.0–46.0)
Lymphocytes Relative: 39 % (ref 12–46)
Lymphs Abs: 2.3 10*3/uL (ref 0.7–4.0)
MCV: 82.1 fL (ref 78.0–100.0)
Monocytes Absolute: 0.8 10*3/uL (ref 0.1–1.0)
RBC: 4.75 MIL/uL (ref 3.87–5.11)
WBC: 5.7 10*3/uL (ref 4.0–10.5)

## 2013-05-21 LAB — URINALYSIS, ROUTINE W REFLEX MICROSCOPIC
Glucose, UA: NEGATIVE mg/dL
Hgb urine dipstick: NEGATIVE
Specific Gravity, Urine: 1.018 (ref 1.005–1.030)
pH: 5 (ref 5.0–8.0)

## 2013-05-21 LAB — COMPREHENSIVE METABOLIC PANEL
ALT: 13 U/L (ref 0–35)
CO2: 21 mEq/L (ref 19–32)
Calcium: 9.1 mg/dL (ref 8.4–10.5)
Chloride: 103 mEq/L (ref 96–112)
GFR calc Af Amer: >90 mL/min (ref >90)
GFR calc non Af Amer: >90 mL/min (ref >90)
Glucose, Bld: 94 mg/dL (ref 70–99)
Sodium: 136 mEq/L (ref 135–145)
Total Bilirubin: 0.4 mg/dL (ref 0.3–1.2)

## 2013-05-21 LAB — URINE MICROSCOPIC-ADD ON

## 2013-05-21 LAB — POCT I-STAT TROPONIN I: Troponin i, poc: 0 ng/mL (ref 0.00–0.08)

## 2013-05-21 MED ORDER — METHOCARBAMOL 500 MG PO TABS: 500.0000 mg | ORAL_TABLET | Freq: Two times a day (BID) | ORAL | Status: DC

## 2013-05-21 MED ORDER — IBUPROFEN 600 MG PO TABS: 600.0000 mg | ORAL_TABLET | Freq: Four times a day (QID) | ORAL | Status: DC | PRN

## 2013-05-21 MED ORDER — IBUPROFEN 800 MG PO TABS
800.0000 mg | ORAL_TABLET | Freq: Once | ORAL | Status: AC
  Administered 2013-05-21: 800 mg via ORAL
  Filled 2013-05-21: qty 1

## 2013-05-21 NOTE — ED Provider Notes (Signed)
History     CSN: [id number]  Arrival date & time 05/21/13  4098   First MD Initiated Contact with Patient 05/21/13 [phone]      Chief Complaint  Patient presents with  . Abdominal Pain  . Shortness of Breath    (Consider location/radiation/quality/duration/timing/severity/associated sxs/prior treatment) Patient is a 27 y.o. female presenting with abdominal pain and shortness of breath. The history is provided by the patient.  Abdominal Pain Associated symptoms include abdominal pain and shortness of breath.  Shortness of Breath Associated symptoms: abdominal pain   pt here with right sided flank pain x 2-3 months--sx have been colicky and are worse with movement and sometimes with eating--no fever, chills, vomiting, diarrhea--no dysuria or hematuria--no tx used pta--pt not seen by provider for same --no rashes  Past Medical History  Diagnosis Date  . Depression   . Bronchitis     Past Surgical History  Procedure Laterality Date  . Dilation and curettage of uterus    . Dental surgery      Family History  Problem Relation Age of Onset  . Hypertension Mother     History  Substance Use Topics  . Smoking status: Current Every Day Smoker -- 0.50 packs/day    Types: Cigarettes  . Smokeless tobacco: Never Used  . Alcohol Use: Yes     Comment: 2x/week    OB History   Grav Para Term Preterm Abortions TAB SAB Ect Mult Living   4 1   3 2 1   1       Review of Systems  Respiratory: Positive for shortness of breath.   Gastrointestinal: Positive for abdominal pain.  All other systems reviewed and are negative.    Allergies  Penicillins  Home Medications   Current Outpatient Rx  Name  Route  Sig  Dispense  Refill  . albuterol (PROVENTIL HFA;VENTOLIN HFA) 108 (90 BASE) MCG/ACT inhaler   Inhalation   Inhale 1-2 puffs into the lungs every 6 (six) hours as needed for wheezing.   1 Inhaler   0   . gabapentin (NEURONTIN) 100 MG capsule   Oral   Take 200 mg by mouth  daily.         . QUEtiapine (SEROQUEL) 400 MG tablet   Oral   Take 400 mg by mouth at bedtime.           BP 144/72  Pulse 106  Temp(Src) 98.8 F (37.1 C) (Oral)  Resp 17  SpO2 97%  LMP 05/02/2013  Physical Exam  Nursing note and vitals reviewed. Constitutional: She is oriented to person, place, and time. She appears well-developed and well-nourished.  Non-toxic appearance. No distress.  HENT:  Head: Normocephalic and atraumatic.  Eyes: Conjunctivae, EOM and lids are normal. Pupils are equal, round, and reactive to light.  Neck: Normal range of motion. Neck supple. No tracheal deviation present. No mass present.  Cardiovascular: Normal rate, regular rhythm and normal heart sounds.  Exam reveals no gallop.   No murmur heard. Pulmonary/Chest: Effort normal and breath sounds normal. No stridor. No respiratory distress. She has no decreased breath sounds. She has no wheezes. She has no rhonchi. She has no rales.  Abdominal: Soft. Normal appearance and bowel sounds are normal. She exhibits no distension. There is no tenderness. There is no rebound and no CVA tenderness.  Musculoskeletal: Normal range of motion. She exhibits no edema and no tenderness.       Arms: Neurological: She is alert and oriented to person, place,  and time. She has normal strength. No cranial nerve deficit or sensory deficit. GCS eye subscore is 4. GCS verbal subscore is 5. GCS motor subscore is 6.  Skin: Skin is warm and dry. No abrasion and no rash noted.  Psychiatric: She has a normal mood and affect. Her speech is normal and behavior is normal.    ED Course  Procedures (including critical care time)  Labs Reviewed  URINALYSIS, ROUTINE W REFLEX MICROSCOPIC  CBC WITH DIFFERENTIAL  COMPREHENSIVE METABOLIC PANEL  LIPASE, BLOOD  POCT PREGNANCY, URINE   No results found.   No diagnosis found.    MDM   Date: 05/21/2013  Rate: 88  Rhythm: normal sinus rhythm  QRS Axis: normal  Intervals:  normal  ST/T Wave abnormalities: normal  Conduction Disutrbances:none  Narrative Interpretation:   Old EKG Reviewed: none available  Patient's work appears been negative. She is stable for discharge.        Toy Baker, MD 05/21/13 [phone]

## 2013-05-21 NOTE — ED Notes (Signed)
Pt states that she has abd pain that has been going on for couple of months and yesterday when she woke up the pain was really bad.  Pt also c/o Shob times one week when she "gets excited or talks". Pt states that she was seen couple months ago and had bronchitis.

## 2013-05-21 NOTE — ED Notes (Signed)
Ultrasound at bedside

## 2013-05-21 NOTE — Progress Notes (Signed)
[facility] has seen patient and provided her with with a oc application and a list of primary care resources.

## 2013-05-21 NOTE — ED Notes (Signed)
Attempted times 2 to start IV and obtain blood specimens, but unsuccessful.

## 2013-07-13 ENCOUNTER — Inpatient Hospital Stay (HOSPITAL_COMMUNITY)
Admission: AD | Admit: 2013-07-13 | Discharge: 2013-07-13 | Disposition: A | Discharge status: Home / Self Care | Payer: Self-pay | Source: Ambulatory Visit | Attending: Family Medicine | Admitting: Family Medicine

## 2013-07-13 DIAGNOSIS — B9689 Other specified bacterial agents as the cause of diseases classified elsewhere: Secondary | ICD-10-CM | POA: Insufficient documentation

## 2013-07-13 DIAGNOSIS — N760 Acute vaginitis: Secondary | ICD-10-CM | POA: Insufficient documentation

## 2013-07-13 DIAGNOSIS — R109 Unspecified abdominal pain: Secondary | ICD-10-CM | POA: Insufficient documentation

## 2013-07-13 DIAGNOSIS — K219 Gastro-esophageal reflux disease without esophagitis: Secondary | ICD-10-CM | POA: Insufficient documentation

## 2013-07-13 DIAGNOSIS — A499 Bacterial infection, unspecified: Secondary | ICD-10-CM

## 2013-07-13 DIAGNOSIS — N949 Unspecified condition associated with female genital organs and menstrual cycle: Secondary | ICD-10-CM | POA: Insufficient documentation

## 2013-07-13 LAB — URINALYSIS, ROUTINE W REFLEX MICROSCOPIC
Glucose, UA: NEGATIVE mg/dL
Leukocytes, UA: NEGATIVE
Nitrite: NEGATIVE
Specific Gravity, Urine: >1.03 — ABNORMAL HIGH (ref 1.005–1.030)
pH: 5.5 (ref 5.0–8.0)

## 2013-07-13 LAB — WET PREP, GENITAL
Trich, Wet Prep: NONE SEEN
Yeast Wet Prep HPF POC: NONE SEEN

## 2013-07-13 MED ORDER — FAMOTIDINE 20 MG PO TABS
40.0000 mg | ORAL_TABLET | Freq: Once | ORAL | Status: AC
  Administered 2013-07-13: 40 mg via ORAL
  Filled 2013-07-13: qty 2

## 2013-07-13 MED ORDER — OMEPRAZOLE 20 MG PO CPDR: 20.0000 mg | DELAYED_RELEASE_CAPSULE | Freq: Every day | ORAL | Status: DC

## 2013-07-13 MED ORDER — METRONIDAZOLE 500 MG PO TABS: 500.0000 mg | ORAL_TABLET | Freq: Two times a day (BID) | ORAL | Status: DC

## 2013-07-13 NOTE — MAU Provider Note (Signed)
History     CSN: [id number]  Arrival date and time: 07/13/13 1249   First Provider Initiated Contact with Patient 07/13/13 1420      Chief Complaint  Patient presents with  . Abdominal Pain  . Vaginal Discharge   HPI [name] is a 27 y.o. [phone] who presents to MAU for upper abdominal pain and vaginal discharge. The patient states that she has had upper abdominal pain x 3 months. She rates her pain at 9/10 at worst. Pain is worse after eating and relieved by Ibuprofen. She denies N/V/D, chest pain, palpitation, SOB or fever. She does have occasional constipation. She has a history of GERD and is not on medications. She has been having a clear vaginal discharge with fishy odor x months. She denies vaginal bleeding or irritation today. She denies UTI symptoms, but states a "funny feeling" at the end of her stream occasionally.   OB History   Grav Para Term Preterm Abortions TAB SAB Ect Mult Living   4 1   3 2 1   1       Past Medical History  Diagnosis Date  . Depression   . Bronchitis     Past Surgical History  Procedure Laterality Date  . Dilation and curettage of uterus    . Dental surgery      Family History  Problem Relation Age of Onset  . Hypertension Mother     History  Substance Use Topics  . Smoking status: Current Every Day Smoker -- 0.50 packs/day    Types: Cigarettes  . Smokeless tobacco: Never Used  . Alcohol Use: Yes     Comment: 2x/week    Allergies:  Allergies  Allergen Reactions  . Penicillins Other (See Comments)    Boils     Prescriptions prior to admission  Medication Sig Dispense Refill  . gabapentin (NEURONTIN) 100 MG capsule Take 200 mg by mouth daily.      Marland Kitchen ibuprofen (ADVIL,MOTRIN) 600 MG tablet Take 1 tablet (600 mg total) by mouth every 6 (six) hours as needed for pain.  30 tablet  0  . QUEtiapine (SEROQUEL) 400 MG tablet Take 400 mg by mouth at bedtime.        Review of Systems  Constitutional: Negative for fever  and malaise/fatigue.  Cardiovascular: Negative for chest pain and palpitations.  Gastrointestinal: Positive for heartburn, abdominal pain and constipation. Negative for nausea, vomiting and diarrhea.  Genitourinary: Negative for dysuria, urgency and frequency.       Neg - vaginal bleeding + vaginal discharge   Physical Exam   Blood pressure 103/81, pulse 86, temperature 98.8 F (37.1 C), temperature source Oral, resp. rate 18, height 5\' 3"  (1.6 m), weight 212 lb (96.163 kg), last menstrual period 07/02/2013, SpO2 97.00%.  Physical Exam  Constitutional: She is oriented to person, place, and time. She appears well-developed and well-nourished. No distress.  HENT:  Head: Normocephalic and atraumatic.  Cardiovascular: Normal rate, regular rhythm and normal heart sounds.   Respiratory: Effort normal and breath sounds normal. No respiratory distress.  GI: Soft. Bowel sounds are normal. She exhibits no distension and no mass. There is tenderness (mild lower abdominal tenderness to palpation). There is no rebound and no guarding.  Genitourinary: Uterus is not enlarged and not tender. Cervix exhibits friability. Cervix exhibits no motion tenderness and no discharge. Right adnexum displays tenderness (mild). Right adnexum displays no mass. Left adnexum displays tenderness. Left adnexum displays no mass. No bleeding around the vagina. Vaginal  discharge (moderate amount of thick, white discharge noted) found.  Neurological: She is alert and oriented to person, place, and time.  Skin: Skin is warm and dry. No erythema.  Psychiatric: She has a normal mood and affect.   Results for orders placed during the hospital encounter of 07/13/13 (from the past 24 hour(s))  URINALYSIS, ROUTINE W REFLEX MICROSCOPIC     Status: Abnormal   Collection Time    07/13/13  1:05 PM      Result Value Range   Color, Urine YELLOW  YELLOW   APPearance HAZY (*) CLEAR   Specific Gravity, Urine >1.030 (*) 1.005 - 1.030   pH  5.5  5.0 - 8.0   Glucose, UA NEGATIVE  NEGATIVE mg/dL   Hgb urine dipstick NEGATIVE  NEGATIVE   Bilirubin Urine NEGATIVE  NEGATIVE   Ketones, ur NEGATIVE  NEGATIVE mg/dL   Protein, ur NEGATIVE  NEGATIVE mg/dL   Urobilinogen, UA 0.2  0.0 - 1.0 mg/dL   Nitrite NEGATIVE  NEGATIVE   Leukocytes, UA NEGATIVE  NEGATIVE  POCT PREGNANCY, URINE     Status: None   Collection Time    07/13/13  1:28 PM      Result Value Range   Preg Test, Ur NEGATIVE  NEGATIVE    MAU Course  Procedures None  MDM UPT - negative UA, Wet prep, GC/Chlamydia today Pepcid given in MAU today Assessment and Plan  A: Bacterial vaginosis GERD  P: Discharge home Rx for Flagyl and Prilosec sent to patient's pharmacy AVS contains information about GERD diet Patient advised to follow-up with PCP for GERD, may need GI referral Patient may return to MAU as needed  Freddi Starr, PA-C  07/13/2013, 2:20 PM

## 2013-07-13 NOTE — MAU Provider Note (Signed)
Chart reviewed and agree with management and plan.  

## 2013-07-13 NOTE — MAU Note (Signed)
Patient is in with c/o upper abdominal sharp pains and clear, odorous vaginal discharge for months now. She denies vaginal bleeding. She states that the abdominal pain gets worse after eating at times. Denies fever, nausea or vomiting. lmp 07/02/13

## 2013-10-10 ENCOUNTER — Encounter (HOSPITAL_COMMUNITY): Payer: Self-pay | Admitting: Emergency Medicine

## 2013-10-10 ENCOUNTER — Emergency Department (HOSPITAL_COMMUNITY)
Admission: EM | Admit: 2013-10-10 | Discharge: 2013-10-10 | Disposition: A | Discharge status: Home / Self Care | Payer: Self-pay | Attending: Emergency Medicine | Admitting: Emergency Medicine

## 2013-10-10 DIAGNOSIS — F17200 Nicotine dependence, unspecified, uncomplicated: Secondary | ICD-10-CM | POA: Insufficient documentation

## 2013-10-10 DIAGNOSIS — Z79899 Other long term (current) drug therapy: Secondary | ICD-10-CM | POA: Insufficient documentation

## 2013-10-10 DIAGNOSIS — Z8709 Personal history of other diseases of the respiratory system: Secondary | ICD-10-CM | POA: Insufficient documentation

## 2013-10-10 DIAGNOSIS — F329 Major depressive disorder, single episode, unspecified: Secondary | ICD-10-CM | POA: Insufficient documentation

## 2013-10-10 DIAGNOSIS — K5900 Constipation, unspecified: Secondary | ICD-10-CM | POA: Insufficient documentation

## 2013-10-10 DIAGNOSIS — K279 Peptic ulcer, site unspecified, unspecified as acute or chronic, without hemorrhage or perforation: Secondary | ICD-10-CM | POA: Insufficient documentation

## 2013-10-10 DIAGNOSIS — F3289 Other specified depressive episodes: Secondary | ICD-10-CM | POA: Insufficient documentation

## 2013-10-10 DIAGNOSIS — Z880 Allergy status to penicillin: Secondary | ICD-10-CM | POA: Insufficient documentation

## 2013-10-10 DIAGNOSIS — K2970 Gastritis, unspecified, without bleeding: Secondary | ICD-10-CM | POA: Insufficient documentation

## 2013-10-10 LAB — CBC WITH DIFFERENTIAL/PLATELET
Eosinophils Absolute: 0.2 10*3/uL (ref 0.0–0.7)
Eosinophils Relative: 2 % (ref 0–5)
HCT: 36.9 % (ref 36.0–46.0)
Hemoglobin: 13 g/dL (ref 12.0–15.0)
Lymphs Abs: 4.8 10*3/uL — ABNORMAL HIGH (ref 0.7–4.0)
MCH: 29.5 pg (ref 26.0–34.0)
MCV: 83.9 fL (ref 78.0–100.0)
Monocytes Absolute: 0.7 10*3/uL (ref 0.1–1.0)
Monocytes Relative: 8 % (ref 3–12)
RBC: 4.4 MIL/uL (ref 3.87–5.11)

## 2013-10-10 LAB — COMPREHENSIVE METABOLIC PANEL
Alkaline Phosphatase: 74 U/L (ref 39–117)
BUN: 11 mg/dL (ref 6–23)
GFR calc Af Amer: >90 mL/min (ref >90)
Glucose, Bld: 86 mg/dL (ref 70–99)
Potassium: 3.4 mEq/L — ABNORMAL LOW (ref 3.5–5.1)
Total Protein: 7.7 g/dL (ref 6.0–8.3)

## 2013-10-10 LAB — HCG, SERUM, QUALITATIVE: Preg, Serum: NEGATIVE

## 2013-10-10 MED ORDER — GI COCKTAIL ~~LOC~~
30.0000 mL | Freq: Once | ORAL | Status: AC
  Administered 2013-10-10: 30 mL via ORAL
  Filled 2013-10-10: qty 30

## 2013-10-10 MED ORDER — HYDROCODONE-ACETAMINOPHEN 5-325 MG PO TABS: 1.0000 | ORAL_TABLET | ORAL | Status: DC | PRN

## 2013-10-10 MED ORDER — OMEPRAZOLE 20 MG PO CPDR: 20.0000 mg | DELAYED_RELEASE_CAPSULE | Freq: Two times a day (BID) | ORAL | Status: DC

## 2013-10-10 MED ORDER — PANTOPRAZOLE SODIUM 40 MG IV SOLR
40.0000 mg | Freq: Once | INTRAVENOUS | Status: AC
  Administered 2013-10-10: 40 mg via INTRAVENOUS
  Filled 2013-10-10: qty 40

## 2013-10-10 MED ORDER — SUCRALFATE 1 G PO TABS: 1.0000 g | ORAL_TABLET | Freq: Four times a day (QID) | ORAL | Status: DC

## 2013-10-10 MED ORDER — ONDANSETRON HCL 4 MG PO TABS: 4.0000 mg | ORAL_TABLET | Freq: Four times a day (QID) | ORAL | Status: DC

## 2013-10-10 MED ORDER — ONDANSETRON HCL 4 MG/2ML IJ SOLN
4.0000 mg | Freq: Once | INTRAMUSCULAR | Status: AC
  Administered 2013-10-10: 4 mg via INTRAVENOUS
  Filled 2013-10-10: qty 2

## 2013-10-10 NOTE — ED Notes (Signed)
Pt states she is having abdominal pain, was diagnosed with stomach ulcers but did not make it back to the doctor to get any pain medication. Also reports no normal BM's in a month, states she has been giving herself enemas to have a BM, two yesterday with the second having a small BM afterwards. C/O nausea no vomiting.

## 2013-10-10 NOTE — ED Provider Notes (Signed)
CSN: [id number]     Arrival date & time 10/10/13  1905 History   First MD Initiated Contact with Patient 10/10/13 1909     Chief Complaint  Patient presents with  . Abdominal Pain    HPI  Patient presents with a few days history of epigastric abdominal pain. She states in the past he says the discomfort that she may have had ulcers. Never been formally evaluate her seen by a physician. Since the big meals and greasy foods or spicy foods will cause your symptoms. The last 2 days have been or worse symptoms, however she has had symptoms really for about 4-5 weeks. About a week into her symptoms she started taking Motrin. She normally drinks coffee "most of the day". She works at [employer]. She does not use alcohol. She does smoke.  No black or bloody stools. Nausea no vomiting.  Past Medical History  Diagnosis Date  . Depression   . Bronchitis    Past Surgical History  Procedure Laterality Date  . Dilation and curettage of uterus    . Dental surgery     Family History  Problem Relation Age of Onset  . Hypertension Mother    History  Substance Use Topics  . Smoking status: Current Every Day Smoker -- 0.50 packs/day    Types: Cigarettes  . Smokeless tobacco: Never Used  . Alcohol Use: Yes     Comment: 2x/week   OB History   Grav Para Term Preterm Abortions TAB SAB Ect Mult Living   4 1   3 2 1   1      Review of Systems  Constitutional: Negative for fever, chills, diaphoresis, appetite change and fatigue.  HENT: Negative for mouth sores, sore throat and trouble swallowing.   Eyes: Negative for visual disturbance.  Respiratory: Negative for cough, chest tightness, shortness of breath and wheezing.   Cardiovascular: Negative for chest pain.  Gastrointestinal: Positive for nausea, vomiting, abdominal pain and constipation. Negative for diarrhea, blood in stool, abdominal distention and anal bleeding.  Endocrine: Negative for polydipsia, polyphagia and polyuria.  Genitourinary:  Negative for dysuria, frequency and hematuria.  Musculoskeletal: Negative for gait problem.  Skin: Negative for color change, pallor and rash.  Neurological: Negative for dizziness, syncope, light-headedness and headaches.  Hematological: Does not bruise/bleed easily.  Psychiatric/Behavioral: Negative for behavioral problems and confusion.    Allergies  Penicillins  Home Medications   Current Outpatient Rx  Name  Route  Sig  Dispense  Refill  . gabapentin (NEURONTIN) 100 MG capsule   Oral   Take 200 mg by mouth daily.         . QUEtiapine (SEROQUEL) 400 MG tablet   Oral   Take 400 mg by mouth at bedtime.         Marland Kitchen HYDROcodone-acetaminophen (NORCO/VICODIN) 5-325 MG per tablet   Oral   Take 1 tablet by mouth every 4 (four) hours as needed for pain.   10 tablet   0   . omeprazole (PRILOSEC) 20 MG capsule   Oral   Take 1 capsule (20 mg total) by mouth 2 (two) times daily.   60 capsule   0   . ondansetron (ZOFRAN) 4 MG tablet   Oral   Take 1 tablet (4 mg total) by mouth every 6 (six) hours.   12 tablet   0   . sucralfate (CARAFATE) 1 G tablet   Oral   Take 1 tablet (1 g total) by mouth 4 (four) times daily.  60 tablet   0    BP 138/76  Pulse 83  Temp(Src) 99.2 F (37.3 C) (Oral)  SpO2 99%  LMP 09/17/2013 Physical Exam  Constitutional: She is oriented to person, place, and time. She appears well-developed and well-nourished. No distress.  HENT:  Head: Normocephalic.  Eyes: Conjunctivae are normal. Pupils are equal, round, and reactive to light. No scleral icterus.  Neck: Normal range of motion. Neck supple. No thyromegaly present.  Cardiovascular: Normal rate and regular rhythm.  Exam reveals no gallop and no friction rub.   No murmur heard. Pulmonary/Chest: Effort normal and breath sounds normal. No respiratory distress. She has no wheezes. She has no rales.  Abdominal: Soft. Bowel sounds are normal. She exhibits no distension. There is no tenderness.  There is no rebound.  Mild epigastric tenderness. No guarding no rebound no peritoneal irritation.  No right upper quadrant tenderness negative Murphy's sign. No lower abdominal tenderness.   Musculoskeletal: Normal range of motion.  Neurological: She is alert and oriented to person, place, and time.  Skin: Skin is warm and dry. No rash noted.  Psychiatric: She has a normal mood and affect. Her behavior is normal.    ED Course  Procedures (including critical care time) Labs Review Labs Reviewed  CBC WITH DIFFERENTIAL - Abnormal; Notable for the following:    Neutrophils Relative % 34 (*)    Lymphocytes Relative 55 (*)    Lymphs Abs 4.8 (*)    All other components within normal limits  COMPREHENSIVE METABOLIC PANEL - Abnormal; Notable for the following:    Potassium 3.4 (*)    Total Bilirubin 0.1 (*)    All other components within normal limits  LIPASE, BLOOD  HCG, SERUM, QUALITATIVE   Imaging Review No results found.  EKG Interpretation   None       MDM   1. Gastritis   2. Peptic ulcer disease    Hepatobiliary or pancreatic enzymes are normal. Has multiple risk factors for gastritis exam and history consistent with gastritis or peptic ulcer disease I discussed with her at length the treatment. Discussed avoiding alcohol tobacco caffeine anti-inflammatories.    Roney Marion, MD 10/10/13 2214

## 2013-10-10 NOTE — ED Notes (Signed)
Pt reports she has been having back and abd pain for a couple of weeks. Reports that she has been diagnosed with stomach ulcers. Reports some SOB related to the pain that started this afternoon. Denies any nausea or vomiting. Reports some constipation.

## 2013-10-26 ENCOUNTER — Emergency Department (HOSPITAL_COMMUNITY): Payer: Self-pay

## 2013-10-26 ENCOUNTER — Emergency Department (HOSPITAL_COMMUNITY)
Admission: EM | Admit: 2013-10-26 | Discharge: 2013-10-26 | Disposition: A | Discharge status: Home / Self Care | Payer: Self-pay | Attending: Emergency Medicine | Admitting: Emergency Medicine

## 2013-10-26 ENCOUNTER — Encounter (HOSPITAL_COMMUNITY): Payer: Self-pay | Admitting: Emergency Medicine

## 2013-10-26 DIAGNOSIS — R195 Other fecal abnormalities: Secondary | ICD-10-CM | POA: Insufficient documentation

## 2013-10-26 DIAGNOSIS — R05 Cough: Secondary | ICD-10-CM | POA: Insufficient documentation

## 2013-10-26 DIAGNOSIS — R0602 Shortness of breath: Secondary | ICD-10-CM | POA: Insufficient documentation

## 2013-10-26 DIAGNOSIS — R110 Nausea: Secondary | ICD-10-CM | POA: Insufficient documentation

## 2013-10-26 DIAGNOSIS — R1013 Epigastric pain: Secondary | ICD-10-CM | POA: Insufficient documentation

## 2013-10-26 DIAGNOSIS — Z8709 Personal history of other diseases of the respiratory system: Secondary | ICD-10-CM | POA: Insufficient documentation

## 2013-10-26 DIAGNOSIS — Z79899 Other long term (current) drug therapy: Secondary | ICD-10-CM | POA: Insufficient documentation

## 2013-10-26 DIAGNOSIS — F17200 Nicotine dependence, unspecified, uncomplicated: Secondary | ICD-10-CM | POA: Insufficient documentation

## 2013-10-26 DIAGNOSIS — F3289 Other specified depressive episodes: Secondary | ICD-10-CM | POA: Insufficient documentation

## 2013-10-26 DIAGNOSIS — Z3202 Encounter for pregnancy test, result negative: Secondary | ICD-10-CM | POA: Insufficient documentation

## 2013-10-26 DIAGNOSIS — R059 Cough, unspecified: Secondary | ICD-10-CM | POA: Insufficient documentation

## 2013-10-26 DIAGNOSIS — F329 Major depressive disorder, single episode, unspecified: Secondary | ICD-10-CM | POA: Insufficient documentation

## 2013-10-26 DIAGNOSIS — Z880 Allergy status to penicillin: Secondary | ICD-10-CM | POA: Insufficient documentation

## 2013-10-26 LAB — URINALYSIS, ROUTINE W REFLEX MICROSCOPIC
Bilirubin Urine: NEGATIVE
Glucose, UA: NEGATIVE mg/dL
Ketones, ur: NEGATIVE mg/dL
Leukocytes, UA: NEGATIVE
Protein, ur: NEGATIVE mg/dL
Urobilinogen, UA: 0.2 mg/dL (ref 0.0–1.0)

## 2013-10-26 LAB — CBC WITH DIFFERENTIAL/PLATELET
Basophils Absolute: 0 10*3/uL (ref 0.0–0.1)
Basophils Relative: 1 % (ref 0–1)
Hemoglobin: 12.7 g/dL (ref 12.0–15.0)
MCHC: 34.1 g/dL (ref 30.0–36.0)
Monocytes Relative: 6 % (ref 3–12)
Neutro Abs: 2.4 10*3/uL (ref 1.7–7.7)
Neutrophils Relative %: 39 % — ABNORMAL LOW (ref 43–77)
RDW: 12.1 % (ref 11.5–15.5)

## 2013-10-26 LAB — COMPREHENSIVE METABOLIC PANEL
ALT: 12 U/L (ref 0–35)
AST: 14 U/L (ref 0–37)
Albumin: 3.6 g/dL (ref 3.5–5.2)
Alkaline Phosphatase: 68 U/L (ref 39–117)
Chloride: 105 mEq/L (ref 96–112)
Potassium: 3.8 mEq/L (ref 3.5–5.1)
Sodium: 136 mEq/L (ref 135–145)
Total Bilirubin: 0.3 mg/dL (ref 0.3–1.2)

## 2013-10-26 LAB — POCT I-STAT TROPONIN I: Troponin i, poc: 0 ng/mL (ref 0.00–0.08)

## 2013-10-26 MED ORDER — FAMOTIDINE 20 MG PO TABS: 20.0000 mg | ORAL_TABLET | Freq: Two times a day (BID) | ORAL | Status: DC

## 2013-10-26 MED ORDER — GI COCKTAIL ~~LOC~~
30.0000 mL | Freq: Once | ORAL | Status: AC
  Administered 2013-10-26: 30 mL via ORAL
  Filled 2013-10-26: qty 30

## 2013-10-26 MED ORDER — OMEPRAZOLE 20 MG PO CPDR: 20.0000 mg | DELAYED_RELEASE_CAPSULE | Freq: Every day | ORAL | Status: DC

## 2013-10-26 NOTE — ED Provider Notes (Signed)
CSN: [id number]     Arrival date & time 10/26/13  1223 History   First MD Initiated Contact with Patient 10/26/13 1314     Chief Complaint  Patient presents with  . Abdominal Pain  . Shortness of Breath   (Consider location/radiation/quality/duration/timing/severity/associated sxs/prior Treatment) The history is provided by the patient.   Pt presents with epigastric pain that has been ongoing for one month and is unchanged.  States she was diagnosed with an ulcer and filled the prescription for hydrocodone but none of the other prescriptions.  States the pain is aching, worse after meals and at the end of the day.  She continues to take ibuprofen 2 pills PO TID as well as fiber pills.  Is having formed green stools - no black or bloody stools.  Occasional nausea without vomiting.  Has tingling sensation after emptying her bladder x 1 week.  Denies urinary frequency or urgency, denies fevers, chills, CP, SOB, denies abnormal vaginal discharge or bleeding or menstural irregularity. Has had dry cough x 2 weeks, is concerned she has bronchitis.   Past Medical History  Diagnosis Date  . Depression   . Bronchitis    Past Surgical History  Procedure Laterality Date  . Dilation and curettage of uterus    . Dental surgery     Family History  Problem Relation Age of Onset  . Hypertension Mother    History  Substance Use Topics  . Smoking status: Current Every Day Smoker -- 0.50 packs/day    Types: Cigarettes  . Smokeless tobacco: Never Used  . Alcohol Use: Yes     Comment: 2x/week   OB History   Grav Para Term Preterm Abortions TAB SAB Ect Mult Living   4 1   3 2 1   1      Review of Systems  Constitutional: Negative for fever and chills.  HENT: Negative for sore throat.   Respiratory: Positive for cough. Negative for shortness of breath.   Cardiovascular: Negative for chest pain.  Gastrointestinal: Positive for nausea and abdominal pain. Negative for vomiting, diarrhea,  constipation and blood in stool.  Genitourinary: Negative for dysuria, urgency, frequency, vaginal bleeding and vaginal discharge.    Allergies  Penicillins  Home Medications   Current Outpatient Rx  Name  Route  Sig  Dispense  Refill  . HYDROcodone-acetaminophen (NORCO/VICODIN) 5-325 MG per tablet   Oral   Take 1 tablet by mouth every 4 (four) hours as needed for pain.   10 tablet   0   . ibuprofen (ADVIL,MOTRIN) 200 MG tablet   Oral   Take 400 mg by mouth every 6 (six) hours as needed.         Marland Kitchen QUEtiapine (SEROQUEL) 400 MG tablet   Oral   Take 400 mg by mouth at bedtime.         . famotidine (PEPCID) 20 MG tablet   Oral   Take 1 tablet (20 mg total) by mouth 2 (two) times daily.   60 tablet   0   . omeprazole (PRILOSEC) 20 MG capsule   Oral   Take 1 capsule (20 mg total) by mouth daily.   30 capsule   0    BP 126/62  Pulse 71  Temp(Src) 98.3 F (36.8 C) (Oral)  Resp 20  SpO2 100%  LMP 10/17/2013 Physical Exam  Nursing note and vitals reviewed. Constitutional: She appears well-developed and well-nourished. No distress.  HENT:  Head: Normocephalic and atraumatic.  Neck: Neck supple.  Cardiovascular: Normal rate and regular rhythm.   Pulmonary/Chest: Effort normal and breath sounds normal. No respiratory distress. She has no wheezes. She has no rales.  Abdominal: Soft. She exhibits no distension. There is no tenderness. There is no rebound and no guarding.  Neurological: She is alert.  Skin: She is not diaphoretic.    ED Course  Procedures (including critical care time) Labs Review Labs Reviewed  CBC WITH DIFFERENTIAL - Abnormal; Notable for the following:    Neutrophils Relative % 39 (*)    Lymphocytes Relative 52 (*)    All other components within normal limits  COMPREHENSIVE METABOLIC PANEL - Abnormal; Notable for the following:    Glucose, Bld 124 (*)    All other components within normal limits  URINALYSIS, ROUTINE W REFLEX MICROSCOPIC -  Abnormal; Notable for the following:    APPearance CLOUDY (*)    All other components within normal limits  LIPASE, BLOOD  POCT I-STAT TROPONIN I  POCT PREGNANCY, URINE   Imaging Review Dg Chest 2 View  10/26/2013   CLINICAL DATA:  Dyspnea with history of tobacco use and bronchitis  EXAM: CHEST  2 VIEW  COMPARISON:  May 21, 2013.  FINDINGS: The lungs are adequately inflated. There is no focal infiltrate. The cardiac silhouette is normal in size. The pulmonary vascularity is not engorged. There is no pleural effusion or pneumothorax. The mediastinum is normal in width. The observed portions of the bony thorax are normal.  IMPRESSION: There is no evidence of acute pneumonia. I cannot exclude acute bronchitis in the appropriate clinical setting.   Electronically Signed   By: David  [place]   On: 10/26/2013 15:17    EKG Interpretation   None       MDM   1. Epigastric pain   2. Cough    Pt with persistent epigastric pain that is unchanged x 1 month.  Worse after eating.  Likely reflux vs PUD.  Pt encouraged to stop chronic NSAID use, stop smoking.  Pt also advised to take PPI vs H2 blocker, she was unable to afford the last prescription and could not find OTC possibility, I have given her prescription for two different options and have encouraged her to take them, discussed how to take them.  Pt also c/o cough without SOB or CP, cough has been persistent x 2 weeks.  CXR negative.  Lungs CTAB.  Labs, UA unremarkable. D/C home with PCP, GI follow up. Discussed results, findings, treatment, and follow up  with patient.  Pt given return precautions.  Pt verbalizes understanding and agrees with plan.      I doubt any other EMC precluding discharge at this time including, but not necessarily limited to the following: pneumonia, ruptured viscus, cholecystitis.     Trixie Dredge, PA-C 10/27/13 [phone]

## 2013-10-26 NOTE — ED Notes (Signed)
Pt also c/o nausea and states she vomited last night. Pt states she has constipation.

## 2013-10-26 NOTE — Progress Notes (Signed)
[facility] provided pt with a list of primary care resources and a [facility] [employer] [facility] application.  °

## 2013-10-26 NOTE — ED Notes (Signed)
Pt states she has been having abd pain for months and was told she has ulcers.  Pt states that she also has shob and chest pain for two weeks.

## 2013-10-26 NOTE — ED Notes (Signed)
Patient is aware that we need urine, PA or MD have been in room when we have tried to get a specimen

## 2013-10-27 NOTE — ED Provider Notes (Signed)
Medical screening examination/treatment/procedure(s) were performed by non-physician practitioner and as supervising physician I was immediately available for consultation/collaboration.  EKG Interpretation   None         William Alda Gaultney, MD 10/27/13 1521 

## 2014-03-11 ENCOUNTER — Observation Stay (HOSPITAL_COMMUNITY)
Admission: EM | Admit: 2014-03-11 | Discharge: 2014-03-13 | Disposition: A | Discharge status: Home / Self Care | Payer: Self-pay | Attending: General Surgery | Admitting: General Surgery

## 2014-03-11 ENCOUNTER — Encounter (HOSPITAL_COMMUNITY): Payer: Self-pay | Admitting: Emergency Medicine

## 2014-03-11 ENCOUNTER — Emergency Department (HOSPITAL_COMMUNITY): Payer: Self-pay

## 2014-03-11 DIAGNOSIS — Z79899 Other long term (current) drug therapy: Secondary | ICD-10-CM | POA: Insufficient documentation

## 2014-03-11 DIAGNOSIS — F1210 Cannabis abuse, uncomplicated: Secondary | ICD-10-CM | POA: Insufficient documentation

## 2014-03-11 DIAGNOSIS — K8010 Calculus of gallbladder with chronic cholecystitis without obstruction: Secondary | ICD-10-CM

## 2014-03-11 DIAGNOSIS — F17200 Nicotine dependence, unspecified, uncomplicated: Secondary | ICD-10-CM | POA: Insufficient documentation

## 2014-03-11 DIAGNOSIS — R1110 Vomiting, unspecified: Secondary | ICD-10-CM

## 2014-03-11 DIAGNOSIS — E669 Obesity, unspecified: Secondary | ICD-10-CM | POA: Insufficient documentation

## 2014-03-11 DIAGNOSIS — K8020 Calculus of gallbladder without cholecystitis without obstruction: Secondary | ICD-10-CM

## 2014-03-11 DIAGNOSIS — F3130 Bipolar disorder, current episode depressed, mild or moderate severity, unspecified: Secondary | ICD-10-CM | POA: Insufficient documentation

## 2014-03-11 HISTORY — DX: Tobacco use: Z72.0

## 2014-03-11 HISTORY — DX: Cannabis use, unspecified, uncomplicated: F12.90

## 2014-03-11 HISTORY — DX: Calculus of gallbladder without cholecystitis without obstruction: K80.20

## 2014-03-11 HISTORY — DX: Bipolar disorder, unspecified: F31.9

## 2014-03-11 LAB — CBC WITH DIFFERENTIAL/PLATELET
BASOS PCT: 0 % (ref 0–1)
Basophils Absolute: 0 10*3/uL (ref 0.0–0.1)
Eosinophils Absolute: 0.1 10*3/uL (ref 0.0–0.7)
Eosinophils Relative: 2 % (ref 0–5)
HCT: 38.2 % (ref 36.0–46.0)
HEMOGLOBIN: 13.2 g/dL (ref 12.0–15.0)
Lymphocytes Relative: 53 % — ABNORMAL HIGH (ref 12–46)
Lymphs Abs: 3.6 10*3/uL (ref 0.7–4.0)
MCH: 28.8 pg (ref 26.0–34.0)
MCHC: 34.6 g/dL (ref 30.0–36.0)
MCV: 83.2 fL (ref 78.0–100.0)
MONOS PCT: 11 % (ref 3–12)
Monocytes Absolute: 0.7 10*3/uL (ref 0.1–1.0)
NEUTROS PCT: 35 % — AB (ref 43–77)
Neutro Abs: 2.3 10*3/uL (ref 1.7–7.7)
Platelets: 310 10*3/uL (ref 150–400)
RBC: 4.59 MIL/uL (ref 3.87–5.11)
RDW: 12.5 % (ref 11.5–15.5)
WBC: 6.8 10*3/uL (ref 4.0–10.5)

## 2014-03-11 LAB — URINALYSIS, ROUTINE W REFLEX MICROSCOPIC
Bilirubin Urine: NEGATIVE
Glucose, UA: NEGATIVE mg/dL
HGB URINE DIPSTICK: NEGATIVE
Ketones, ur: NEGATIVE mg/dL
Nitrite: NEGATIVE
PH: 5.5 (ref 5.0–8.0)
Protein, ur: NEGATIVE mg/dL
SPECIFIC GRAVITY, URINE: 1.017 (ref 1.005–1.030)
Urobilinogen, UA: 0.2 mg/dL (ref 0.0–1.0)

## 2014-03-11 LAB — COMPREHENSIVE METABOLIC PANEL
ALBUMIN: 3.9 g/dL (ref 3.5–5.2)
ALK PHOS: 72 U/L (ref 39–117)
ALT: 14 U/L (ref 0–35)
AST: 14 U/L (ref 0–37)
BUN: 9 mg/dL (ref 6–23)
CHLORIDE: 107 meq/L (ref 96–112)
CO2: 18 mEq/L — ABNORMAL LOW (ref 19–32)
Calcium: 9.3 mg/dL (ref 8.4–10.5)
Creatinine, Ser: 0.74 mg/dL (ref 0.50–1.10)
GFR calc Af Amer: >90 mL/min (ref >90)
GFR calc non Af Amer: >90 mL/min (ref >90)
Glucose, Bld: 108 mg/dL — ABNORMAL HIGH (ref 70–99)
Potassium: 4.3 mEq/L (ref 3.7–5.3)
SODIUM: 138 meq/L (ref 137–147)
Total Protein: 7.7 g/dL (ref 6.0–8.3)

## 2014-03-11 LAB — URINE MICROSCOPIC-ADD ON

## 2014-03-11 LAB — SURGICAL PCR SCREEN
MRSA, PCR: NEGATIVE
Staphylococcus aureus: NEGATIVE

## 2014-03-11 LAB — POC URINE PREG, ED: Preg Test, Ur: NEGATIVE

## 2014-03-11 LAB — LIPASE, BLOOD: LIPASE: 20 U/L (ref 11–59)

## 2014-03-11 MED ORDER — QUETIAPINE FUMARATE 400 MG PO TABS
400.0000 mg | ORAL_TABLET | Freq: Every day | ORAL | Status: DC
  Administered 2014-03-11 – 2014-03-12 (×2): 400 mg via ORAL
  Filled 2014-03-11 (×4): qty 1

## 2014-03-11 MED ORDER — ONDANSETRON HCL 4 MG/2ML IJ SOLN: 4.0000 mg | Freq: Four times a day (QID) | INTRAMUSCULAR | Status: DC | PRN

## 2014-03-11 MED ORDER — KCL IN DEXTROSE-NACL 20-5-0.9 MEQ/L-%-% IV SOLN
INTRAVENOUS | Status: DC
  Administered 2014-03-11 – 2014-03-13 (×5): via INTRAVENOUS
  Filled 2014-03-11 (×5): qty 1000

## 2014-03-11 MED ORDER — NICOTINE 14 MG/24HR TD PT24
14.0000 mg | MEDICATED_PATCH | Freq: Every day | TRANSDERMAL | Status: DC
  Administered 2014-03-11 – 2014-03-12 (×2): 14 mg via TRANSDERMAL
  Filled 2014-03-11 (×3): qty 1

## 2014-03-11 MED ORDER — CIPROFLOXACIN IN D5W 400 MG/200ML IV SOLN
400.0000 mg | Freq: Two times a day (BID) | INTRAVENOUS | Status: DC
  Administered 2014-03-11 – 2014-03-13 (×4): 400 mg via INTRAVENOUS
  Filled 2014-03-11 (×4): qty 200

## 2014-03-11 MED ORDER — SODIUM CHLORIDE 0.9 % IV BOLUS (SEPSIS)
1000.0000 mL | Freq: Once | INTRAVENOUS | Status: AC
  Administered 2014-03-11: 1000 mL via INTRAVENOUS

## 2014-03-11 MED ORDER — MORPHINE SULFATE 4 MG/ML IJ SOLN
4.0000 mg | Freq: Once | INTRAMUSCULAR | Status: AC
  Administered 2014-03-11: 4 mg via INTRAVENOUS
  Filled 2014-03-11: qty 1

## 2014-03-11 MED ORDER — HYDROCODONE-ACETAMINOPHEN 5-325 MG PO TABS
1.0000 | ORAL_TABLET | ORAL | Status: DC | PRN
  Administered 2014-03-11 – 2014-03-13 (×6): 2 via ORAL
  Filled 2014-03-11: qty 2
  Filled 2014-03-11: qty 1
  Filled 2014-03-11 (×4): qty 2
  Filled 2014-03-11: qty 1

## 2014-03-11 MED ORDER — HEPARIN SODIUM (PORCINE) 5000 UNIT/ML IJ SOLN
5000.0000 [IU] | Freq: Three times a day (TID) | INTRAMUSCULAR | Status: AC
  Administered 2014-03-11: 5000 [IU] via SUBCUTANEOUS
  Filled 2014-03-11: qty 1

## 2014-03-11 MED ORDER — ACETAMINOPHEN 650 MG RE SUPP
650.0000 mg | Freq: Four times a day (QID) | RECTAL | Status: DC | PRN
  Administered 2014-03-12: 650 mg via RECTAL
  Filled 2014-03-11: qty 1

## 2014-03-11 MED ORDER — ONDANSETRON HCL 4 MG/2ML IJ SOLN
4.0000 mg | Freq: Once | INTRAMUSCULAR | Status: AC
  Administered 2014-03-11: 4 mg via INTRAVENOUS
  Filled 2014-03-11: qty 2

## 2014-03-11 MED ORDER — DIPHENHYDRAMINE HCL 50 MG/ML IJ SOLN: 12.5000 mg | Freq: Four times a day (QID) | INTRAMUSCULAR | Status: DC | PRN

## 2014-03-11 MED ORDER — MORPHINE SULFATE 2 MG/ML IJ SOLN
1.0000 mg | INTRAMUSCULAR | Status: DC | PRN
  Administered 2014-03-12: 2 mg via INTRAVENOUS
  Administered 2014-03-12 (×2): 3 mg via INTRAVENOUS
  Administered 2014-03-12: 2 mg via INTRAVENOUS
  Filled 2014-03-11 (×2): qty 1
  Filled 2014-03-11 (×2): qty 2

## 2014-03-11 MED ORDER — DIPHENHYDRAMINE HCL 12.5 MG/5ML PO ELIX: 12.5000 mg | ORAL_SOLUTION | Freq: Four times a day (QID) | ORAL | Status: DC | PRN

## 2014-03-11 MED ORDER — GABAPENTIN 300 MG PO CAPS
600.0000 mg | ORAL_CAPSULE | Freq: Every morning | ORAL | Status: DC
  Administered 2014-03-12: 600 mg via ORAL
  Filled 2014-03-11 (×2): qty 2

## 2014-03-11 MED ORDER — ACETAMINOPHEN 325 MG PO TABS: 650.0000 mg | ORAL_TABLET | Freq: Four times a day (QID) | ORAL | Status: DC | PRN

## 2014-03-11 MED ORDER — FAMOTIDINE 20 MG PO TABS
20.0000 mg | ORAL_TABLET | Freq: Two times a day (BID) | ORAL | Status: DC
  Administered 2014-03-11 – 2014-03-12 (×3): 20 mg via ORAL
  Filled 2014-03-11 (×5): qty 1

## 2014-03-11 NOTE — ED Notes (Signed)
Bedside ultrasound in progress.

## 2014-03-11 NOTE — H&P (Signed)
Amy Yoder is an 28 y.o. female.   Chief Complaint: abdominal pain, nausea and some vomiting. HPI: Pt has a history of pain, intermittent nausea and vomiting with meals on and off for 6 months.  She says it was usually worst with eating.  She say for the last week she has not been able to eat or drink much.  She has ongoing pain RUQ. She currently does not complain of pain here, but she did with US exam.  She presented to the ER today because of recurrent pain nausea and some vomiting.  US shows:  The gallbladder is contracted and demonstrates distal shadowing.  There may be stones present. Of note is the fact that the patient ingested imaged a.m. was approximately 2 hr ago. Which could have resulted in some emptying of the gallbladder. However, there is a  positive sonographic Murphy's sign.  2. The remainder the examination is within the limits of normal.  She does report some cereal and M&M's this AM.  Labs are normal.  She complains of ongoing pain.  She has had multiple prior evaluations in the ED's with abdominal pain.  We were ask to see.   Past Medical History  Diagnosis Date  . Depression   . Bronchitis   . Bipolar I disorder   . Tobacco use   . Marijuana use     Past Surgical History  Procedure Laterality Date  . Dilation and curettage of uterus    . Dental surgery      Family History  Problem Relation Age of Onset  . Hypertension Mother    Social History:  reports that she has been smoking Cigarettes.  She has been smoking about 0.50 packs per day. She has never used smokeless tobacco. She reports that she drinks alcohol. She reports that she uses illicit drugs. She lives with boyfriend, has 1 daughter, works at [employer], Tobacco:1/2 PPD ETOH:  Occasional Drugs:  none  Allergies:  Allergies  Allergen Reactions  . Penicillins Other (See Comments)    Boils     Prior to Admission medications   Medication Sig Start Date End Date Taking? Authorizing Provider   famotidine (PEPCID) 20 MG tablet Take 1 tablet (20 mg total) by mouth 2 (two) times daily. 10/26/13  Yes Clayton Bibles, PA-C  gabapentin (NEURONTIN) 300 MG capsule Take 600 mg by mouth every morning.   Yes Historical Provider, MD  omeprazole (PRILOSEC) 20 MG capsule Take 1 capsule (20 mg total) by mouth daily. 10/26/13  Yes Clayton Bibles, PA-C  QUEtiapine (SEROQUEL) 400 MG tablet Take 400 mg by mouth at bedtime.   Yes Historical Provider, MD  SERTRALINE HCL PO Take 1 tablet by mouth daily.   Yes Historical Provider, MD     Results for orders placed during the hospital encounter of 03/11/14 (from the past 48 hour(s))  CBC WITH DIFFERENTIAL     Status: Abnormal   Collection Time    03/11/14  1:39 PM      Result Value Ref Range   WBC 6.8  4.0 - 10.5 K/uL   RBC 4.59  3.87 - 5.11 MIL/uL   Hemoglobin 13.2  12.0 - 15.0 g/dL   HCT 38.2  36.0 - 46.0 %   MCV 83.2  78.0 - 100.0 fL   MCH 28.8  26.0 - 34.0 pg   MCHC 34.6  30.0 - 36.0 g/dL   RDW 12.5  11.5 - 15.5 %   Platelets 310  150 - 400 K/uL  Neutrophils Relative % 35 (*) 43 - 77 %   Neutro Abs 2.3  1.7 - 7.7 K/uL   Lymphocytes Relative 53 (*) 12 - 46 %   Lymphs Abs 3.6  0.7 - 4.0 K/uL   Monocytes Relative 11  3 - 12 %   Monocytes Absolute 0.7  0.1 - 1.0 K/uL   Eosinophils Relative 2  0 - 5 %   Eosinophils Absolute 0.1  0.0 - 0.7 K/uL   Basophils Relative 0  0 - 1 %   Basophils Absolute 0.0  0.0 - 0.1 K/uL  COMPREHENSIVE METABOLIC PANEL     Status: Abnormal   Collection Time    03/11/14  1:39 PM      Result Value Ref Range   Sodium 138  137 - 147 mEq/L   Potassium 4.3  3.7 - 5.3 mEq/L   Chloride 107  96 - 112 mEq/L   CO2 18 (*) 19 - 32 mEq/L   Glucose, Bld 108 (*) 70 - 99 mg/dL   BUN 9  6 - 23 mg/dL   Creatinine, Ser 0.74  0.50 - 1.10 mg/dL   Calcium 9.3  8.4 - 10.5 mg/dL   Total Protein 7.7  6.0 - 8.3 g/dL   Albumin 3.9  3.5 - 5.2 g/dL   AST 14  0 - 37 U/L   ALT 14  0 - 35 U/L   Alkaline Phosphatase 72  39 - 117 U/L   Total  Bilirubin <0.2 (*) 0.3 - 1.2 mg/dL   GFR calc non Af Amer >90  >90 mL/min   GFR calc Af Amer >90  >90 mL/min   Comment: (NOTE)     The eGFR has been calculated using the CKD EPI equation.     This calculation has not been validated in all clinical situations.     eGFR's persistently <90 mL/min signify possible Chronic Kidney     Disease.  LIPASE, BLOOD     Status: None   Collection Time    03/11/14  1:39 PM      Result Value Ref Range   Lipase 20  11 - 59 U/L  URINALYSIS, ROUTINE W REFLEX MICROSCOPIC     Status: Abnormal   Collection Time    03/11/14  1:52 PM      Result Value Ref Range   Color, Urine YELLOW  YELLOW   APPearance CLOUDY (*) CLEAR   Specific Gravity, Urine 1.017  1.005 - 1.030   pH 5.5  5.0 - 8.0   Glucose, UA NEGATIVE  NEGATIVE mg/dL   Hgb urine dipstick NEGATIVE  NEGATIVE   Bilirubin Urine NEGATIVE  NEGATIVE   Ketones, ur NEGATIVE  NEGATIVE mg/dL   Protein, ur NEGATIVE  NEGATIVE mg/dL   Urobilinogen, UA 0.2  0.0 - 1.0 mg/dL   Nitrite NEGATIVE  NEGATIVE   Leukocytes, UA MODERATE (*) NEGATIVE  URINE MICROSCOPIC-ADD ON     Status: Abnormal   Collection Time    03/11/14  1:52 PM      Result Value Ref Range   Squamous Epithelial / LPF MANY (*) RARE   WBC, UA 3-6  <3 WBC/hpf   RBC / HPF 3-6  <3 RBC/hpf   Bacteria, UA MANY (*) RARE   Urine-Other MUCOUS PRESENT    POC URINE PREG, ED     Status: None   Collection Time    03/11/14  2:03 PM      Result Value Ref Range   Preg Test, Ur  NEGATIVE  NEGATIVE   Comment:            THE SENSITIVITY OF THIS     METHODOLOGY IS >24 mIU/mL   US Abdomen Complete  03/11/2014   CLINICAL DATA:  Right upper quadrant discomfort  EXAM: ULTRASOUND ABDOMEN COMPLETE  COMPARISON:  None.  FINDINGS: Gallbladder:  The gallbladder appears contracted and contains stones creating a wall echo shadow type contour. There is a positive sonographic Murphy's sign.  Common bile duct:  Diameter: 5 mm  Liver:  No focal lesion identified. Within normal  limits in parenchymal echogenicity.  IVC:  No abnormality visualized.  Pancreas:  The visualized portions of the pancreas exhibit no acute abnormalities. The pancreatic duct is visible in the midbody and measures 3 mm in diameter.  Spleen:  Size and appearance within normal limits.  Right Kidney:  Length: 11.3 cm. Echogenicity within normal limits. No mass or hydronephrosis visualized.  Left Kidney:  Length: 11.6 cm. Echogenicity within normal limits. No mass or hydronephrosis visualized.  Abdominal aorta:  No abdominal aortic aneurysm is demonstrated.  Other findings:  None.  IMPRESSION: 1. The gallbladder is contracted and demonstrates distal shadowing. There may be stones present. Of note is the fact that the patient ingested imaged a.m. was approximately 2 hr ago. Which could have resulted in some emptying of the gallbladder. However, there is a positive sonographic Murphy's sign. 2. The remainder the examination is within the limits of normal.   Electronically Signed   By: David  Martinique   On: 03/11/2014 15:18    Review of Systems  Constitutional: Positive for chills and weight loss (weight is up, followed by [place] [facility].). Negative for fever, malaise/fatigue and diaphoresis.  HENT: Negative.   Eyes: Negative.   Respiratory: Negative.        She says she wake up at times SOB, and her boyfriend says she does, "all kinds of weird stuff when sleeping."  Cardiovascular: Negative.   Gastrointestinal: Positive for heartburn, nausea, vomiting, abdominal pain (she currently complains more about pain on the left than the right  ), constipation (chronic take Miralax daily for this) and blood in stool. Negative for diarrhea and melena.  Genitourinary: Negative.   Musculoskeletal: Negative.   Skin: Positive for rash (she thinks she has a rash on her hands.).  Neurological: Negative.  Negative for weakness.  Endo/Heme/Allergies: Negative.   Psychiatric/Behavioral:       She has a history of  depression and Bipolar syndrome.    Blood pressure 139/78, pulse 72, temperature 98.5 F (36.9 C), temperature source Oral, resp. rate 20, SpO2 97.00%. Physical Exam  Constitutional: She is oriented to person, place, and time. She appears well-developed and well-nourished. No distress.  She is very over weight.  BP 133/74  Pulse 71  Temp(Src) 98.5 F (36.9 C) (Oral)  Resp 14  SpO2 100%   HENT:  Head: Normocephalic and atraumatic.  Nose: Nose normal.  Eyes: Conjunctivae and EOM are normal. Pupils are equal, round, and reactive to light. Right eye exhibits no discharge. Left eye exhibits no discharge. No scleral icterus.  Neck: Normal range of motion. Neck supple. No JVD present. No tracheal deviation present. No thyromegaly present.  Cardiovascular: Normal rate, regular rhythm, normal heart sounds and intact distal pulses.  Exam reveals no gallop.   No murmur heard. Respiratory: Effort normal and breath sounds normal. No respiratory distress. She has no wheezes. She has no rales. She exhibits no tenderness.  GI: Soft. Bowel  sounds are normal. She exhibits no distension and no mass. There is tenderness (she has some tenderness, more LUQ than the RUQ currently.). There is no rebound and no guarding.  Musculoskeletal: She exhibits no edema and no tenderness.  Lymphadenopathy:    She has no cervical adenopathy.  Neurological: She is alert and oriented to person, place, and time. No cranial nerve deficit.  Skin: Skin is warm and dry. No rash noted. She is not diaphoretic. No erythema. No pallor.  Psychiatric: She has a normal mood and affect. Her behavior is normal. Judgment and thought content normal.     Assessment/Plan 1.  Symptomatic cholelithiasis 2.  Bipolar/depression 3.  Tobacco use  Plan:  Clears tonight, cipro, home meds, and probable cholecystectomy in the AM.  Fatma Rutten 03/11/2014, 5:01 PM

## 2014-03-11 NOTE — ED Notes (Signed)
Attempted to call report at this time. Rn to call back

## 2014-03-11 NOTE — ED Provider Notes (Signed)
CSN: [id number]     Arrival date & time 03/11/14  1207 History   First MD Initiated Contact with Patient 03/11/14 1401     Chief Complaint  Patient presents with  . Abdominal Pain     (Consider location/radiation/quality/duration/timing/severity/associated sxs/prior Treatment) HPI Comments: Deon Ivey is a 28 y.o. female with a past medical history of depression presenting the Emergency Department with a chief complaint of worsening abdominal pain for 6 months.  Reports upper abdominal pain aggravated food and fluids. Reports vomiting, almost every other day after eating due to "real bad heartburn".  She denies dysuria. Denies history of gallstones. No history of abdominal surgeries. The patient reports last oral intake PTA, m&m's. LNMP: 02/16/2014   Patient is a 28 y.o. female presenting with abdominal pain. The history is provided by the patient and medical records. No language interpreter was used.  Abdominal Pain Associated symptoms: nausea and vomiting   Associated symptoms: no chills, no diarrhea, no dysuria, no fever, no hematuria, no vaginal bleeding and no vaginal discharge     Past Medical History  Diagnosis Date  . Depression   . Bronchitis    Past Surgical History  Procedure Laterality Date  . Dilation and curettage of uterus    . Dental surgery     Family History  Problem Relation Age of Onset  . Hypertension Mother    History  Substance Use Topics  . Smoking status: Current Every Day Smoker -- 0.50 packs/day    Types: Cigarettes  . Smokeless tobacco: Never Used  . Alcohol Use: Yes     Comment: 2x/week   OB History   Grav Para Term Preterm Abortions TAB SAB Ect Mult Living   4 1   3 2 1   1      Review of Systems  Constitutional: Negative for fever and chills.  Gastrointestinal: Positive for nausea, vomiting, abdominal pain, blood in stool and abdominal distention. Negative for diarrhea.  Genitourinary: Negative for dysuria, urgency, hematuria,  flank pain, vaginal bleeding and vaginal discharge.  Musculoskeletal: Positive for back pain.      Allergies  Penicillins  Home Medications   Current Outpatient Rx  Name  Route  Sig  Dispense  Refill  . famotidine (PEPCID) 20 MG tablet   Oral   Take 1 tablet (20 mg total) by mouth 2 (two) times daily.   60 tablet   0   . HYDROcodone-acetaminophen (NORCO/VICODIN) 5-325 MG per tablet   Oral   Take 1 tablet by mouth every 4 (four) hours as needed for pain.   10 tablet   0   . ibuprofen (ADVIL,MOTRIN) 200 MG tablet   Oral   Take 400 mg by mouth every 6 (six) hours as needed.         Marland Kitchen omeprazole (PRILOSEC) 20 MG capsule   Oral   Take 1 capsule (20 mg total) by mouth daily.   30 capsule   0   . QUEtiapine (SEROQUEL) 400 MG tablet   Oral   Take 400 mg by mouth at bedtime.          BP 114/62  Pulse 75  Temp(Src) 98.5 F (36.9 C) (Oral)  Resp 18  SpO2 98% Physical Exam  Nursing note and vitals reviewed. Constitutional: She is oriented to person, place, and time. She appears well-developed and well-nourished. No distress.  Exam limited by patient's body habitus.    HENT:  Head: Normocephalic and atraumatic.  Eyes: EOM are normal. Pupils are  equal, round, and reactive to light. No scleral icterus.  Neck: Neck supple.  Cardiovascular: Normal rate, regular rhythm and normal heart sounds.   No murmur heard. Pulmonary/Chest: Effort normal and breath sounds normal. She has no wheezes. She has no rales. She exhibits no tenderness.  Abdominal: Soft. Bowel sounds are normal. There is tenderness in the right upper quadrant. There is no rebound, no guarding and no CVA tenderness.  Musculoskeletal: Normal range of motion. She exhibits no edema.  Neurological: She is alert and oriented to person, place, and time.  Skin: Skin is warm and dry. No rash noted.  Psychiatric: She has a normal mood and affect. Her behavior is normal.    ED Course  Procedures (including  critical care time) Labs Review Labs Reviewed  CBC WITH DIFFERENTIAL - Abnormal; Notable for the following:    Neutrophils Relative % 35 (*)    Lymphocytes Relative 53 (*)    All other components within normal limits  COMPREHENSIVE METABOLIC PANEL  LIPASE, BLOOD  URINALYSIS, ROUTINE W REFLEX MICROSCOPIC  POC URINE PREG, ED   Imaging Review US Abdomen Complete  03/11/2014   CLINICAL DATA:  Right upper quadrant discomfort  EXAM: ULTRASOUND ABDOMEN COMPLETE  COMPARISON:  None.  FINDINGS: Gallbladder:  The gallbladder appears contracted and contains stones creating a wall echo shadow type contour. There is a positive sonographic Murphy's sign.  Common bile duct:  Diameter: 5 mm  Liver:  No focal lesion identified. Within normal limits in parenchymal echogenicity.  IVC:  No abnormality visualized.  Pancreas:  The visualized portions of the pancreas exhibit no acute abnormalities. The pancreatic duct is visible in the midbody and measures 3 mm in diameter.  Spleen:  Size and appearance within normal limits.  Right Kidney:  Length: 11.3 cm. Echogenicity within normal limits. No mass or hydronephrosis visualized.  Left Kidney:  Length: 11.6 cm. Echogenicity within normal limits. No mass or hydronephrosis visualized.  Abdominal aorta:  No abdominal aortic aneurysm is demonstrated.  Other findings:  None.  IMPRESSION: 1. The gallbladder is contracted and demonstrates distal shadowing. There may be stones present. Of note is the fact that the patient ingested imaged a.m. was approximately 2 hr ago. Which could have resulted in some emptying of the gallbladder. However, there is a positive sonographic Murphy's sign. 2. The remainder the examination is within the limits of normal.   Electronically Signed   By: David  Martinique   On: 03/11/2014 15:18     EKG Interpretation None      MDM   Final diagnoses:  Cholelithiasis  Vomiting   Pt with 6 month history of abdominal pain, has not followed up with GI  specalist.  Reports vomiting.  RUQ discomfort with palpation.  [place] ordered to evaluate for cholelithiasis. US shows stones, positive sonographic Murphy's. 1550 re-eval pt reports persistant RUQ discomfort.  RUQ tender to palpation, ? Positive murphy's, Pt body habitus limits PE. 1610 Discussed with Dr.  Creig Hines, who agrees to evaluate her in the ED.  Pt will be admitted for surgery. Meds given in ED:  Medications  ondansetron (ZOFRAN) injection 4 mg (4 mg Intravenous Given 03/11/14 1445)  morphine 4 MG/ML injection 4 mg (4 mg Intravenous Given 03/11/14 1445)  sodium chloride 0.9 % bolus 1,000 mL (0 mLs Intravenous Stopped 03/11/14 1530)    New Prescriptions   No medications on file        Lorrine Kin, PA-C 03/13/14 1235

## 2014-03-11 NOTE — Progress Notes (Signed)
[facility] provided pt with a list of primary care resources. Patient stated that she was pending insurance on 4/1.

## 2014-03-11 NOTE — ED Notes (Signed)
Pt alert, arrives from home, c/o abd pain, onset 6 months ago, hx of gallstones, resp even unlabored, skin pwd

## 2014-03-12 ENCOUNTER — Encounter (HOSPITAL_COMMUNITY): Payer: MEDICAID | Admitting: Anesthesiology

## 2014-03-12 ENCOUNTER — Observation Stay (HOSPITAL_COMMUNITY): Payer: Self-pay | Admitting: Anesthesiology

## 2014-03-12 ENCOUNTER — Encounter (HOSPITAL_COMMUNITY)
Admission: EM | Disposition: A | Discharge status: Home / Self Care | Payer: Self-pay | Source: Home / Self Care | Attending: Emergency Medicine

## 2014-03-12 ENCOUNTER — Observation Stay (HOSPITAL_COMMUNITY): Payer: Self-pay

## 2014-03-12 ENCOUNTER — Encounter (HOSPITAL_COMMUNITY): Payer: Self-pay | Admitting: Registered Nurse

## 2014-03-12 HISTORY — PX: CHOLECYSTECTOMY: SHX55

## 2014-03-12 LAB — COMPREHENSIVE METABOLIC PANEL
ALT: 12 U/L (ref 0–35)
AST: 14 U/L (ref 0–37)
Albumin: 3.2 g/dL — ABNORMAL LOW (ref 3.5–5.2)
Alkaline Phosphatase: 54 U/L (ref 39–117)
BILIRUBIN TOTAL: 0.3 mg/dL (ref 0.3–1.2)
BUN: 7 mg/dL (ref 6–23)
CHLORIDE: 105 meq/L (ref 96–112)
CO2: 23 meq/L (ref 19–32)
Calcium: 8.6 mg/dL (ref 8.4–10.5)
Creatinine, Ser: 0.85 mg/dL (ref 0.50–1.10)
GFR calc Af Amer: >90 mL/min (ref >90)
Glucose, Bld: 96 mg/dL (ref 70–99)
Potassium: 4.3 mEq/L (ref 3.7–5.3)
Sodium: 139 mEq/L (ref 137–147)
Total Protein: 6.2 g/dL (ref 6.0–8.3)

## 2014-03-12 LAB — CBC
HCT: 34.5 % — ABNORMAL LOW (ref 36.0–46.0)
Hemoglobin: 11.6 g/dL — ABNORMAL LOW (ref 12.0–15.0)
MCH: 28.2 pg (ref 26.0–34.0)
MCHC: 33.6 g/dL (ref 30.0–36.0)
MCV: 83.7 fL (ref 78.0–100.0)
PLATELETS: 295 10*3/uL (ref 150–400)
RBC: 4.12 MIL/uL (ref 3.87–5.11)
RDW: 12.5 % (ref 11.5–15.5)
WBC: 7.7 10*3/uL (ref 4.0–10.5)

## 2014-03-12 LAB — LIPASE, BLOOD: LIPASE: 18 U/L (ref 11–59)

## 2014-03-12 SURGERY — LAPAROSCOPIC CHOLECYSTECTOMY WITH INTRAOPERATIVE CHOLANGIOGRAM
Anesthesia: General

## 2014-03-12 MED ORDER — FENTANYL CITRATE 0.05 MG/ML IJ SOLN
INTRAMUSCULAR | Status: AC
  Filled 2014-03-12: qty 5

## 2014-03-12 MED ORDER — DEXAMETHASONE SODIUM PHOSPHATE 10 MG/ML IJ SOLN
INTRAMUSCULAR | Status: AC
  Filled 2014-03-12: qty 1

## 2014-03-12 MED ORDER — SUCCINYLCHOLINE CHLORIDE 20 MG/ML IJ SOLN
INTRAMUSCULAR | Status: DC | PRN
  Administered 2014-03-12: 100 mg via INTRAVENOUS

## 2014-03-12 MED ORDER — HYDROMORPHONE HCL PF 1 MG/ML IJ SOLN
INTRAMUSCULAR | Status: AC
  Filled 2014-03-12: qty 1

## 2014-03-12 MED ORDER — LACTATED RINGERS IV SOLN
INTRAVENOUS | Status: DC
  Administered 2014-03-12 (×2): 1000 mL via INTRAVENOUS

## 2014-03-12 MED ORDER — SIMETHICONE 80 MG PO CHEW
80.0000 mg | CHEWABLE_TABLET | Freq: Once | ORAL | Status: AC
  Administered 2014-03-12: 80 mg via ORAL
  Filled 2014-03-12: qty 1

## 2014-03-12 MED ORDER — GLYCOPYRROLATE 0.2 MG/ML IJ SOLN
INTRAMUSCULAR | Status: DC | PRN
  Administered 2014-03-12: .8 mg via INTRAVENOUS

## 2014-03-12 MED ORDER — PROMETHAZINE HCL 25 MG/ML IJ SOLN: 6.2500 mg | INTRAMUSCULAR | Status: DC | PRN

## 2014-03-12 MED ORDER — PROPOFOL 10 MG/ML IV BOLUS
INTRAVENOUS | Status: AC
  Filled 2014-03-12: qty 20

## 2014-03-12 MED ORDER — LIDOCAINE HCL (CARDIAC) 20 MG/ML IV SOLN
INTRAVENOUS | Status: AC
  Filled 2014-03-12: qty 5

## 2014-03-12 MED ORDER — LABETALOL HCL 5 MG/ML IV SOLN
INTRAVENOUS | Status: DC | PRN
  Administered 2014-03-12: 5 mg via INTRAVENOUS
  Administered 2014-03-12: 2.5 mg via INTRAVENOUS

## 2014-03-12 MED ORDER — LACTATED RINGERS IV SOLN: INTRAVENOUS | Status: DC

## 2014-03-12 MED ORDER — PANTOPRAZOLE SODIUM 40 MG PO TBEC
40.0000 mg | DELAYED_RELEASE_TABLET | Freq: Every day | ORAL | Status: DC
  Administered 2014-03-12: 40 mg via ORAL
  Filled 2014-03-12 (×2): qty 1

## 2014-03-12 MED ORDER — DEXAMETHASONE SODIUM PHOSPHATE 10 MG/ML IJ SOLN
INTRAMUSCULAR | Status: DC | PRN
  Administered 2014-03-12: 10 mg via INTRAVENOUS

## 2014-03-12 MED ORDER — LACTATED RINGERS IR SOLN
Status: DC | PRN
  Administered 2014-03-12: 250 mL

## 2014-03-12 MED ORDER — GLYCOPYRROLATE 0.2 MG/ML IJ SOLN
INTRAMUSCULAR | Status: AC
  Filled 2014-03-12: qty 4

## 2014-03-12 MED ORDER — NEOSTIGMINE METHYLSULFATE 1 MG/ML IJ SOLN
INTRAMUSCULAR | Status: DC | PRN
  Administered 2014-03-12: 5 mg via INTRAVENOUS

## 2014-03-12 MED ORDER — MIDAZOLAM HCL 5 MG/5ML IJ SOLN
INTRAMUSCULAR | Status: DC | PRN
  Administered 2014-03-12: 2 mg via INTRAVENOUS

## 2014-03-12 MED ORDER — SERTRALINE HCL 25 MG PO TABS
25.0000 mg | ORAL_TABLET | Freq: Every day | ORAL | Status: DC
  Administered 2014-03-12: 25 mg via ORAL
  Filled 2014-03-12 (×2): qty 1

## 2014-03-12 MED ORDER — BUPIVACAINE-EPINEPHRINE 0.25% -1:200000 IJ SOLN
INTRAMUSCULAR | Status: DC | PRN
  Administered 2014-03-12: 20 mL

## 2014-03-12 MED ORDER — PROPOFOL 10 MG/ML IV BOLUS
INTRAVENOUS | Status: DC | PRN
  Administered 2014-03-12: 200 mg via INTRAVENOUS

## 2014-03-12 MED ORDER — FENTANYL CITRATE 0.05 MG/ML IJ SOLN
INTRAMUSCULAR | Status: DC | PRN
  Administered 2014-03-12 (×4): 50 ug via INTRAVENOUS
  Administered 2014-03-12: 100 ug via INTRAVENOUS
  Administered 2014-03-12 (×2): 50 ug via INTRAVENOUS
  Administered 2014-03-12: 100 ug via INTRAVENOUS

## 2014-03-12 MED ORDER — BUPIVACAINE-EPINEPHRINE 0.25% -1:200000 IJ SOLN
INTRAMUSCULAR | Status: AC
  Filled 2014-03-12: qty 1

## 2014-03-12 MED ORDER — ROCURONIUM BROMIDE 100 MG/10ML IV SOLN
INTRAVENOUS | Status: DC | PRN
  Administered 2014-03-12: 5 mg via INTRAVENOUS
  Administered 2014-03-12: 30 mg via INTRAVENOUS

## 2014-03-12 MED ORDER — HYDROMORPHONE HCL PF 1 MG/ML IJ SOLN
0.2500 mg | INTRAMUSCULAR | Status: DC | PRN
Start: 2014-03-12 — End: 2014-03-12
  Administered 2014-03-12 (×2): 0.5 mg via INTRAVENOUS

## 2014-03-12 MED ORDER — IOHEXOL 300 MG/ML  SOLN
INTRAMUSCULAR | Status: DC | PRN
  Administered 2014-03-12: 10 mL

## 2014-03-12 MED ORDER — LIDOCAINE HCL (CARDIAC) 20 MG/ML IV SOLN
INTRAVENOUS | Status: DC | PRN
  Administered 2014-03-12: 80 mg via INTRAVENOUS

## 2014-03-12 MED ORDER — MIDAZOLAM HCL 2 MG/2ML IJ SOLN
INTRAMUSCULAR | Status: AC
  Filled 2014-03-12: qty 2

## 2014-03-12 MED ORDER — SIMETHICONE 80 MG PO CHEW
80.0000 mg | CHEWABLE_TABLET | Freq: Four times a day (QID) | ORAL | Status: DC | PRN
  Filled 2014-03-12: qty 1

## 2014-03-12 MED ORDER — ONDANSETRON HCL 4 MG/2ML IJ SOLN
INTRAMUSCULAR | Status: DC | PRN
  Administered 2014-03-12: 4 mg via INTRAVENOUS

## 2014-03-12 MED ORDER — ONDANSETRON HCL 4 MG/2ML IJ SOLN
INTRAMUSCULAR | Status: AC
Start: 2014-03-12 — End: 2014-03-12
  Filled 2014-03-12: qty 2

## 2014-03-12 SURGICAL SUPPLY — 32 items
APPLIER CLIP ROT 10 11.4 M/L (STAPLE) ×3
CANISTER SUCTION 2500CC (MISCELLANEOUS) ×3 IMPLANT
CATH REDDICK CHOLANGI 4FR 50CM (CATHETERS) ×3 IMPLANT
CHLORAPREP W/TINT 26ML (MISCELLANEOUS) ×3 IMPLANT
CLIP APPLIE ROT 10 11.4 M/L (STAPLE) ×1 IMPLANT
COVER MAYO STAND STRL (DRAPES) ×3 IMPLANT
DECANTER SPIKE VIAL GLASS SM (MISCELLANEOUS) ×3 IMPLANT
DERMABOND ADVANCED (GAUZE/BANDAGES/DRESSINGS) ×2
DERMABOND ADVANCED .7 DNX12 (GAUZE/BANDAGES/DRESSINGS) ×1 IMPLANT
DRAPE C-ARM 42X120 X-RAY (DRAPES) ×3 IMPLANT
DRAPE LAPAROSCOPIC ABDOMINAL (DRAPES) ×3 IMPLANT
ELECT REM PT RETURN 9FT ADLT (ELECTROSURGICAL) ×3
ELECTRODE REM PT RTRN 9FT ADLT (ELECTROSURGICAL) ×1 IMPLANT
GLOVE BIO SURGEON STRL SZ7.5 (GLOVE) ×6 IMPLANT
GOWN STRL REUS W/ TWL XL LVL3 (GOWN DISPOSABLE) IMPLANT
GOWN STRL REUS W/TWL LRG LVL3 (GOWN DISPOSABLE) ×3 IMPLANT
GOWN STRL REUS W/TWL XL LVL3 (GOWN DISPOSABLE) ×3 IMPLANT
HEMOSTAT SURGICEL 4X8 (HEMOSTASIS) ×3 IMPLANT
IV CATH 14GX2 1/4 (CATHETERS) ×3 IMPLANT
KIT BASIN OR (CUSTOM PROCEDURE TRAY) ×3 IMPLANT
POUCH SPECIMEN RETRIEVAL 10MM (ENDOMECHANICALS) IMPLANT
SET IRRIG TUBING LAPAROSCOPIC (IRRIGATION / IRRIGATOR) ×3 IMPLANT
SOLUTION ANTI FOG 6CC (MISCELLANEOUS) ×3 IMPLANT
SUT MNCRL AB 4-0 PS2 18 (SUTURE) ×3 IMPLANT
TOWEL OR 17X26 10 PK STRL BLUE (TOWEL DISPOSABLE) ×3 IMPLANT
TOWEL OR NON WOVEN STRL DISP B (DISPOSABLE) ×3 IMPLANT
TRAY LAP CHOLE (CUSTOM PROCEDURE TRAY) ×3 IMPLANT
TROCAR BLADELESS OPT 5 75 (ENDOMECHANICALS) ×3 IMPLANT
TROCAR SLEEVE XCEL 5X75 (ENDOMECHANICALS) ×3 IMPLANT
TROCAR XCEL BLUNT TIP 100MML (ENDOMECHANICALS) ×3 IMPLANT
TROCAR XCEL NON-BLD 11X100MML (ENDOMECHANICALS) ×3 IMPLANT
TUBING INSUFFLATION 10FT LAP (TUBING) ×3 IMPLANT

## 2014-03-12 NOTE — Transfer of Care (Signed)
Immediate Anesthesia Transfer of Care Note  Patient: Amy Yoder  Procedure(s) Performed: Procedure(s): LAPAROSCOPIC CHOLECYSTECTOMY WITH INTRAOPERATIVE CHOLANGIOGRAM (N/A)  Patient Location: PACU  Anesthesia Type:General  Level of Consciousness: awake, alert , oriented and patient cooperative  Airway & Oxygen Therapy: Patient Spontanous Breathing and Patient connected to face mask oxygen  Post-op Assessment: Report given to PACU RN, Post -op Vital signs reviewed and stable and Patient moving all extremities  Post vital signs: Reviewed and stable  Complications: No apparent anesthesia complications

## 2014-03-12 NOTE — Interval H&P Note (Signed)
History and Physical Interval Note:  03/12/2014 10:14 AM  Amy Yoder  has presented today for surgery, with the diagnosis of gallstones  The various methods of treatment have been discussed with the patient and family. After consideration of risks, benefits and other options for treatment, the patient has consented to  Procedure(s): LAPAROSCOPIC CHOLECYSTECTOMY WITH INTRAOPERATIVE CHOLANGIOGRAM (N/A) as a surgical intervention .  The patient's history has been reviewed, patient examined, no change in status, stable for surgery.  I have reviewed the patient's chart and labs.  Questions were answered to the patient's satisfaction.     TOTH III,PAUL S

## 2014-03-12 NOTE — Anesthesia Preprocedure Evaluation (Addendum)
Anesthesia Evaluation  Patient identified by MRN, date of birth, ID band Patient awake    Reviewed: Allergy & Precautions, H&P , NPO status , Patient's Chart, lab work & pertinent test results  Airway Mallampati: III TM Distance: >3 FB Neck ROM: Full    Dental  (+) Teeth Intact, Dental Advisory Given   Pulmonary neg pulmonary ROS, Current Smoker,  breath sounds clear to auscultation  Pulmonary exam normal       Cardiovascular negative cardio ROS  Rhythm:Regular Rate:Normal     Neuro/Psych Depression Bipolar Disorder negative neurological ROS  negative psych ROS   GI/Hepatic negative GI ROS, Neg liver ROS, (+)     substance abuse  marijuana use,   Endo/Other  negative endocrine ROS  Renal/GU negative Renal ROS  negative genitourinary   Musculoskeletal negative musculoskeletal ROS (+)   Abdominal (+) + obese,   Peds negative pediatric ROS (+)  Hematology negative hematology ROS (+)   Anesthesia Other Findings   Reproductive/Obstetrics negative OB ROS                        Anesthesia Physical Anesthesia Plan  ASA: II  Anesthesia Plan: General   Post-op Pain Management:    Induction: Intravenous  Airway Management Planned: Oral ETT  Additional Equipment:   Intra-op Plan:   Post-operative Plan: Extubation in OR  Informed Consent: I have reviewed the patients History and Physical, chart, labs and discussed the procedure including the risks, benefits and alternatives for the proposed anesthesia with the patient or authorized representative who has indicated his/her understanding and acceptance.   Dental advisory given  Plan Discussed with: CRNA  Anesthesia Plan Comments:         Anesthesia Quick Evaluation

## 2014-03-12 NOTE — Op Note (Signed)
03/11/2014 - 03/12/2014  12:09 PM  PATIENT:  Amy Yoder  28 y.o. female  PRE-OPERATIVE DIAGNOSIS:  gallstones  POST-OPERATIVE DIAGNOSIS:  gallstones  PROCEDURE:  Procedure(s): LAPAROSCOPIC CHOLECYSTECTOMY WITH INTRAOPERATIVE CHOLANGIOGRAM (N/A)  SURGEON:  Surgeon(s) and Role:    * Merrie Roof, MD - Primary  PHYSICIAN ASSISTANT:   ASSISTANTS: none   ANESTHESIA:   general  EBL:  Total I/O In: 1000 [I.V.:1000] Out: 1020 [Urine:1000; Blood:20]  BLOOD ADMINISTERED:none  DRAINS: none   LOCAL MEDICATIONS USED:  MARCAINE     SPECIMEN:  Source of Specimen:  gallbladder  DISPOSITION OF SPECIMEN:  PATHOLOGY  COUNTS:  YES  TOURNIQUET:  * No tourniquets in log *  DICTATION: .Dragon Dictation  Procedure: After informed consent was obtained the patient was brought to the operating room and placed in the supine position on the operating room table. After adequate induction of general anesthesia the patient's abdomen was prepped with ChloraPrep allowed to dry and draped in usual sterile manner. The area below the umbilicus was infiltrated with quarter percent  Marcaine. A small incision was made with a 15 blade knife. The incision was carried down through the subcutaneous tissue bluntly with a hemostat and [employer] retractors. The linea alba was identified. The linea alba was incised with a 15 blade knife and each side was grasped with Coker clamps. The preperitoneal space was then probed with a hemostat until the peritoneum was opened and access was gained to the abdominal cavity. A 0 Vicryl pursestring stitch was placed in the fascia surrounding the opening. A Hassan cannula was then placed through the opening and anchored in place with the previously placed Vicryl purse string stitch. The abdomen was insufflated with carbon dioxide without difficulty. A laparoscope was inserted through the [facility] cannula in the right upper quadrant was inspected. Next the epigastric region  was infiltrated with % Marcaine. A small incision was made with a 15 blade knife. A 10 mm port was placed bluntly through this incision into the abdominal cavity under direct vision. Next 2 sites were chosen laterally on the right side of the abdomen for placement of 5 mm ports. Each of these areas was infiltrated with quarter percent Marcaine. Small stab incisions were made with a 15 blade knife. 5 mm ports were then placed bluntly through these incisions into the abdominal cavity under direct vision without difficulty. A blunt grasper was placed through the lateralmost 5 mm port and used to grasp the dome of the gallbladder and elevated anteriorly and superiorly. Another blunt grasper was placed through the other 5 mm port and used to retract the body and neck of the gallbladder. A dissector was placed through the epigastric port and using the electrocautery the peritoneal reflection at the gallbladder neck was opened. Blunt dissection was then carried out in this area until the gallbladder neck-cystic duct junction was readily identified and a good window was created. A single clip was placed on the gallbladder neck. A small  ductotomy was made just below the clip with laparoscopic scissors. A 14-gauge Angiocath was then placed through the anterior abdominal wall under direct vision. A Reddick cholangiogram catheter was then placed through the Angiocath and flushed. The catheter was then placed in the cystic duct and anchored in place with a clip. A cholangiogram was obtained that showed no filling defects good emptying into the duodenum an adequate length on the cystic duct. The anchoring clip and catheters were then removed from the patient. 3 clips were  placed proximally on the cystic duct and the duct was divided between the 2 sets of clips. Posterior to this the cystic artery was identified and again dissected bluntly in a circumferential manner until a good window  was created. 2 clips were placed  proximally and one distally on the artery and the artery was divided between the 2 sets of clips. Next a laparoscopic hook cautery device was used to separate the gallbladder from the liver bed. Prior to completely detaching the gallbladder from the liver bed the liver bed was inspected and several small bleeding points were coagulated with the electrocautery until the area was completely hemostatic. The gallbladder was then detached the rest of it from the liver bed without difficulty. A laparoscopic bag was inserted through the epigastric port. The gallbladder was placed within the bag and the bag was sealed. A laparoscope was then moved to the epigastric port. The gallbladder grasper was placed through the [facility] cannula and used to grasp the opening of the bag. The bag with the gallbladder was then removed with the [facility] cannula through the infraumbilical port without difficulty. The fascial defect was then closed with the previously placed Vicryl pursestring stitch as well as with another figure-of-eight 0 Vicryl stitch. The liver bed was inspected again and found to be hemostatic. The abdomen was irrigated with copious amounts of saline until the effluent was clear. The ports were then removed under direct vision without difficulty and were found to be hemostatic. The gas was allowed to escape. The skin incisions were all closed with interrupted 4-0 Monocryl subcuticular stitches. Dermabond dressings were applied. The patient tolerated the procedure well. At the end of the case all needle sponge and instrument counts were correct. The patient was then awakened and taken to recovery in stable condition  PLAN OF CARE: Admit to inpatient   PATIENT DISPOSITION:  PACU - hemodynamically stable.   Delay start of Pharmacological VTE agent (>24hrs) due to surgical blood loss or risk of bleeding: no

## 2014-03-12 NOTE — Preoperative (Signed)
Beta Blockers   Reason not to administer Beta Blockers:Not Applicable 

## 2014-03-13 MED ORDER — OXYCODONE-ACETAMINOPHEN 5-325 MG PO TABS: 1.0000 | ORAL_TABLET | ORAL | Status: DC | PRN

## 2014-03-13 NOTE — Plan of Care (Signed)
Problem: Phase III Progression Outcomes Goal: IV/normal saline lock discontinued Outcome: Not Applicable Date Met:  03/13/14 IV discontinued. Pt discharged to home     

## 2014-03-13 NOTE — Progress Notes (Signed)
Patient ID: Amy Yoder, female   DOB: [birth year], 28 y.o.   MRN: [id number]  Doing well  abd soft  Plan:  discharge

## 2014-03-13 NOTE — Discharge Summary (Signed)
Physician Discharge Summary  Patient ID: Amy Yoder MRN: [id number] DOB/AGE: 1986-11-14 28 y.o.  Admit date: 03/11/2014 Discharge date: 03/13/2014  Admission Diagnoses:  Discharge Diagnoses:  Active Problems:   Symptomatic cholelithiasis s/p lap chole 03/12/2014   Discharged Condition: good  Hospital Course: UNEVENTFUL POST OP RECOVERY.  DISCHARGED HOME POD#1  Consults: None  Significant Diagnostic Studies:   Treatments: surgery: lap chole  Discharge Exam: Blood pressure 128/65, pulse 85, temperature 98.7 F (37.1 C), temperature source Oral, resp. rate 18, height 5\' 3"  (1.6 m), weight 209 lb 3.5 oz (94.9 kg), SpO2 100.00%. General appearance: alert, cooperative and no distress Incision/Wound: abdomen soft, dressings dry  Disposition: 01-Home or Self Care     Medication List         famotidine 20 MG tablet  Commonly known as:  PEPCID  Take 1 tablet (20 mg total) by mouth 2 (two) times daily.     gabapentin 300 MG capsule  Commonly known as:  NEURONTIN  Take 600 mg by mouth every morning.     omeprazole 20 MG capsule  Commonly known as:  PRILOSEC  Take 1 capsule (20 mg total) by mouth daily.     oxyCODONE-acetaminophen 5-325 MG per tablet  Commonly known as:  ROXICET  Take 1-2 tablets by mouth every 4 (four) hours as needed for severe pain.     QUEtiapine 400 MG tablet  Commonly known as:  SEROQUEL  Take 400 mg by mouth at bedtime.     SERTRALINE HCL PO  Take 1 tablet by mouth daily.           Follow-up Information   Follow up with Merrie Roof, MD. Schedule an appointment as soon as possible for a visit in 2 weeks. (make a follow up appointment in 2-3 weeks)    Specialty:  General Surgery   Contact information:   [address] [place] [address] [phone]       Follow up with Merrie Roof, MD. Call in 2 weeks.   Specialty:  General Surgery   Contact information:   [address] [place]  [address] [phone]       Signed: Harl Bowie 03/13/2014, 8:35 AM

## 2014-03-13 NOTE — ED Provider Notes (Signed)
Medical screening examination/treatment/procedure(s) were performed by non-physician practitioner and as supervising physician I was immediately available for consultation/collaboration.   EKG Interpretation None        Ephraim Hamburger, MD 03/13/14 1524

## 2014-03-13 NOTE — Discharge Instructions (Signed)
Laparoscopic Cholecystectomy, Care After Refer to this sheet in the next few weeks. These instructions provide you with information on caring for yourself after your procedure. Your health care provider may also give you more specific instructions. Your treatment has been planned according to current medical practices, but problems sometimes occur. Call your health care provider if you have any problems or questions after your procedure. WHAT TO EXPECT AFTER THE PROCEDURE After your procedure, it is typical to have the following:  Pain at your incision sites. You will be given pain medicines to control the pain.  Mild nausea or vomiting. This should improve after the first 24 hours.  Bloating and possibly shoulder pain from the gas used during the procedure. This will improve after the first 24 hours. HOME CARE INSTRUCTIONS   Change bandages (dressings) as directed by your health care provider.  Keep the wound dry and clean. You may wash the wound gently with soap and water. Gently blot or dab the area dry.  Do not take baths or use swimming pools or hot tubs for 2 weeks or until your health care provider approves.  Only take over-the-counter or prescription medicines as directed by your health care provider.  Continue your normal diet as directed by your health care provider.  Do not lift anything heavier than 10 pounds (4.5 kg) until your health care provider approves.  Do not play contact sports for 1 week or until your health care provider approves. SEEK MEDICAL CARE IF:   You have redness, swelling, or increasing pain in the wound.  You notice yellowish-white fluid (pus) coming from the wound.  You have drainage from the wound that lasts longer than 1 day.  You notice a bad smell coming from the wound or dressing.  Your surgical cuts (incisions) break open. SEEK IMMEDIATE MEDICAL CARE IF:   You develop a rash.  You have difficulty breathing.  You have chest pain.  You  have a fever.  You have increasing pain in the shoulders (shoulder strap areas).  You have dizzy episodes or faint while standing.  You have severe abdominal pain.  You feel sick to your stomach (nauseous) or throw up (vomit) and this lasts for more than 1 day. Document Released: 12/03/2005 Document Revised: 09/23/2013 Document Reviewed: 07/15/2013 [facility] Patient Information 2014 [place].  CCS ______[facility] LAPAROSCOPIC SURGERY: POST OP INSTRUCTIONS Always review your discharge instruction sheet given to you by the facility where your surgery was performed. IF YOU HAVE DISABILITY OR FAMILY LEAVE FORMS, YOU MUST BRING THEM TO THE OFFICE FOR PROCESSING.   DO NOT GIVE THEM TO YOUR DOCTOR.  1. A prescription for pain medication may be given to you upon discharge.  Take your pain medication as prescribed, if needed.  If narcotic pain medicine is not needed, then you may take acetaminophen (Tylenol) or ibuprofen (Advil) as needed. 2. Take your usually prescribed medications unless otherwise directed. 3. If you need a refill on your pain medication, please contact your pharmacy.  They will contact our office to request authorization. Prescriptions will not be filled after 5pm or on week-ends. 4. You should follow a light diet the first few days after arrival home, such as soup and crackers, etc.  Be sure to include lots of fluids daily. 5. Most patients will experience some swelling and bruising in the area of the incisions.  Ice packs will help.  Swelling and bruising can take several days to resolve.  6. It is common  to experience some constipation if taking pain medication after surgery.  Increasing fluid intake and taking a stool softener (such as Colace) will usually help or prevent this problem from occurring.  A mild laxative (Milk of Magnesia or Miralax) should be taken according to package instructions if there are no bowel movements after 48  hours. 7. Unless discharge instructions indicate otherwise, you may remove your bandages 24-48 hours after surgery, and you may shower at that time.  You may have steri-strips (small skin tapes) in place directly over the incision.  These strips should be left on the skin for 7-10 days.  If your surgeon used skin glue on the incision, you may shower in 24 hours.  The glue will flake off over the next 2-3 weeks.  Any sutures or staples will be removed at the office during your follow-up visit. 8. ACTIVITIES:  You may resume regular (light) daily activities beginning the next day--such as daily self-care, walking, climbing stairs--gradually increasing activities as tolerated.  You may have sexual intercourse when it is comfortable.  Refrain from any heavy lifting or straining until approved by your doctor. a. You may drive when you are no longer taking prescription pain medication, you can comfortably wear a seatbelt, and you can safely maneuver your car and apply brakes. b. RETURN TO WORK:  ___MAY RETURN TO WORK ON April 1, 2015_______________________________________________________ 9. You should see your doctor in the office for a follow-up appointment approximately 2-3 weeks after your surgery.  Make sure that you call for this appointment within a day or two after you arrive home to insure a convenient appointment time. 10. OTHER INSTRUCTIONS: ___ICE PACK AND IBUPROFEN ALSO FOR PAIN. 11. MIRALAX FOR CONSTIPATION_______________________________________________________________________________________________________________________ __________________________________________________________________________________________________________________________ WHEN TO CALL YOUR DOCTOR: 1. Fever over 101.0 2. Inability to urinate 3. Continued bleeding from incision. 4. Increased pain, redness, or drainage from the incision. 5. Increasing abdominal pain  The clinic staff is available to answer your questions  during regular business hours.  Please dont hesitate to call and ask to speak to one of the nurses for clinical concerns.  If you have a medical emergency, go to the nearest emergency room or call 911.  A surgeon from [facility] is always on call at the hospital. [address] [place], [place], [place]  [address] ? [address], [place], [place]   [address] [phone] ? [phone] ? FAX [phone] Web site: [email or web address]

## 2014-03-13 NOTE — Progress Notes (Signed)
Pt discharged to home. Discharge instructions given. Prescriptions x 1 also given for pain med. No concerns voiced. Return to work note also given. 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 reports relief from pain med given. Vwilliams, rn.

## 2014-03-15 ENCOUNTER — Encounter (HOSPITAL_COMMUNITY): Payer: Self-pay | Admitting: General Surgery

## 2014-03-15 NOTE — Anesthesia Postprocedure Evaluation (Signed)
Anesthesia Post Note  Patient: Amy Yoder  Procedure(s) Performed: Procedure(s) (LRB): LAPAROSCOPIC CHOLECYSTECTOMY WITH INTRAOPERATIVE CHOLANGIOGRAM (N/A)  Anesthesia type: General  Patient location: PACU  Post pain: Pain level controlled  Post assessment: Post-op Vital signs reviewed  Last Vitals:  Filed Vitals:   03/13/14 0657  BP: 128/65  Pulse: 85  Temp: 37.1 C  Resp: 18    Post vital signs: Reviewed  Level of consciousness: sedated  Complications: No apparent anesthesia complications

## 2014-10-18 ENCOUNTER — Encounter (HOSPITAL_COMMUNITY): Payer: Self-pay | Admitting: General Surgery

## 2015-07-09 ENCOUNTER — Emergency Department (HOSPITAL_COMMUNITY)
Admission: EM | Admit: 2015-07-09 | Discharge: 2015-07-09 | Disposition: A | Discharge status: Home / Self Care | Payer: No Typology Code available for payment source | Attending: Emergency Medicine | Admitting: Emergency Medicine

## 2015-07-09 ENCOUNTER — Encounter (HOSPITAL_COMMUNITY): Payer: Self-pay | Admitting: Emergency Medicine

## 2015-07-09 ENCOUNTER — Emergency Department (HOSPITAL_COMMUNITY): Payer: No Typology Code available for payment source

## 2015-07-09 DIAGNOSIS — J40 Bronchitis, not specified as acute or chronic: Secondary | ICD-10-CM

## 2015-07-09 DIAGNOSIS — R05 Cough: Secondary | ICD-10-CM

## 2015-07-09 DIAGNOSIS — Z79899 Other long term (current) drug therapy: Secondary | ICD-10-CM | POA: Insufficient documentation

## 2015-07-09 DIAGNOSIS — Z3202 Encounter for pregnancy test, result negative: Secondary | ICD-10-CM | POA: Diagnosis not present

## 2015-07-09 DIAGNOSIS — J209 Acute bronchitis, unspecified: Secondary | ICD-10-CM | POA: Insufficient documentation

## 2015-07-09 DIAGNOSIS — Z720 Tobacco use: Secondary | ICD-10-CM | POA: Diagnosis not present

## 2015-07-09 DIAGNOSIS — R059 Cough, unspecified: Secondary | ICD-10-CM

## 2015-07-09 DIAGNOSIS — Z880 Allergy status to penicillin: Secondary | ICD-10-CM | POA: Diagnosis not present

## 2015-07-09 DIAGNOSIS — F319 Bipolar disorder, unspecified: Secondary | ICD-10-CM | POA: Insufficient documentation

## 2015-07-09 DIAGNOSIS — R0602 Shortness of breath: Secondary | ICD-10-CM | POA: Diagnosis present

## 2015-07-09 LAB — I-STAT TROPONIN, ED: Troponin i, poc: 0 ng/mL (ref 0.00–0.08)

## 2015-07-09 LAB — URINALYSIS, ROUTINE W REFLEX MICROSCOPIC
BILIRUBIN URINE: NEGATIVE
Glucose, UA: NEGATIVE mg/dL
Ketones, ur: NEGATIVE mg/dL
NITRITE: NEGATIVE
PH: 6 (ref 5.0–8.0)
Protein, ur: NEGATIVE mg/dL
SPECIFIC GRAVITY, URINE: 1.02 (ref 1.005–1.030)
Urobilinogen, UA: 0.2 mg/dL (ref 0.0–1.0)

## 2015-07-09 LAB — COMPREHENSIVE METABOLIC PANEL
ALT: 42 U/L (ref 14–54)
AST: 28 U/L (ref 15–41)
Albumin: 3.6 g/dL (ref 3.5–5.0)
Alkaline Phosphatase: 89 U/L (ref 38–126)
Anion gap: 9 (ref 5–15)
BILIRUBIN TOTAL: 0.4 mg/dL (ref 0.3–1.2)
BUN: 6 mg/dL (ref 6–20)
CO2: 25 mmol/L (ref 22–32)
Calcium: 9.3 mg/dL (ref 8.9–10.3)
Chloride: 106 mmol/L (ref 101–111)
Creatinine, Ser: 0.72 mg/dL (ref 0.44–1.00)
GFR calc Af Amer: >60 mL/min (ref >60)
GFR calc non Af Amer: >60 mL/min (ref >60)
GLUCOSE: 149 mg/dL — AB (ref 65–99)
POTASSIUM: 3.4 mmol/L — AB (ref 3.5–5.1)
Sodium: 140 mmol/L (ref 135–145)
TOTAL PROTEIN: 8.1 g/dL (ref 6.5–8.1)

## 2015-07-09 LAB — CBC
HEMATOCRIT: 35.7 % — AB (ref 36.0–46.0)
HEMOGLOBIN: 11.8 g/dL — AB (ref 12.0–15.0)
MCH: 27.2 pg (ref 26.0–34.0)
MCHC: 33.1 g/dL (ref 30.0–36.0)
MCV: 82.3 fL (ref 78.0–100.0)
Platelets: 407 10*3/uL — ABNORMAL HIGH (ref 150–400)
RBC: 4.34 MIL/uL (ref 3.87–5.11)
RDW: 12.5 % (ref 11.5–15.5)
WBC: 10.8 10*3/uL — ABNORMAL HIGH (ref 4.0–10.5)

## 2015-07-09 LAB — URINE MICROSCOPIC-ADD ON

## 2015-07-09 LAB — PREGNANCY, URINE: Preg Test, Ur: NEGATIVE

## 2015-07-09 MED ORDER — KETOROLAC TROMETHAMINE 30 MG/ML IJ SOLN
30.0000 mg | Freq: Once | INTRAMUSCULAR | Status: AC
  Administered 2015-07-09: 30 mg via INTRAVENOUS
  Filled 2015-07-09: qty 1

## 2015-07-09 MED ORDER — ALBUTEROL SULFATE HFA 108 (90 BASE) MCG/ACT IN AERS
2.0000 | INHALATION_SPRAY | RESPIRATORY_TRACT | Status: DC | PRN
  Administered 2015-07-09: 2 via RESPIRATORY_TRACT
  Filled 2015-07-09 (×2): qty 6.7

## 2015-07-09 MED ORDER — BENZONATATE 100 MG PO CAPS: 100.0000 mg | ORAL_CAPSULE | Freq: Three times a day (TID) | ORAL | Status: DC

## 2015-07-09 MED ORDER — PREDNISONE 20 MG PO TABS: ORAL_TABLET | ORAL | Status: DC

## 2015-07-09 MED ORDER — PREDNISONE 20 MG PO TABS
60.0000 mg | ORAL_TABLET | Freq: Once | ORAL | Status: AC
  Administered 2015-07-09: 60 mg via ORAL
  Filled 2015-07-09: qty 3

## 2015-07-09 MED ORDER — CIPROFLOXACIN HCL 500 MG PO TABS: 500.0000 mg | ORAL_TABLET | Freq: Two times a day (BID) | ORAL | Status: DC

## 2015-07-09 MED ORDER — IPRATROPIUM-ALBUTEROL 0.5-2.5 (3) MG/3ML IN SOLN
3.0000 mL | Freq: Once | RESPIRATORY_TRACT | Status: AC
  Administered 2015-07-09: 3 mL via RESPIRATORY_TRACT
  Filled 2015-07-09: qty 3

## 2015-07-09 NOTE — ED Provider Notes (Addendum)
CSN: [id number]     Arrival date & time 07/09/15  1225 History   First MD Initiated Contact with Patient 07/09/15 1253     Chief Complaint  Patient presents with  . Shortness of Breath  . Chest Pain  . Headache     (Consider location/radiation/quality/duration/timing/severity/associated sxs/prior Treatment) HPI Comments: Patient with history of bipolar disorder and smoking history presents with cough and congestion. She states her symptoms when going on for about a week. She's had some fevers up to 102. She has nasal congestion with profound coughing. She denies any vomiting other than she's had some posttussive emesis. She has some mild shortness of breath which is mostly associated with coughing. She denies any history of asthma but she has been treated for bronchitis before with inhalers. Over-the-counter medicines without relief. She also has rhinorrhea with bifrontal headache and myalgias.  Patient is a 29 y.o. female presenting with shortness of breath, chest pain, and headaches.  Shortness of Breath Associated symptoms: cough, fever and headaches   Associated symptoms: no abdominal pain, no chest pain, no diaphoresis, no rash and no vomiting   Chest Pain Associated symptoms: cough, fatigue, fever, headache and shortness of breath   Associated symptoms: no abdominal pain, no back pain, no diaphoresis, no dizziness, no nausea, no numbness, not vomiting and no weakness   Headache Associated symptoms: congestion, cough, drainage, fatigue, fever and myalgias   Associated symptoms: no abdominal pain, no back pain, no diarrhea, no dizziness, no nausea, no numbness, no vomiting and no weakness     Past Medical History  Diagnosis Date  . Depression   . Bronchitis   . Bipolar I disorder   . Tobacco use   . Marijuana use    Past Surgical History  Procedure Laterality Date  . Dilation and curettage of uterus    . Dental surgery    . Cholecystectomy N/A 03/12/2014    Procedure:  LAPAROSCOPIC CHOLECYSTECTOMY WITH INTRAOPERATIVE CHOLANGIOGRAM;  Surgeon: Merrie Roof, MD;  Location: [facility];  Service: General;  Laterality: N/A;   Family History  Problem Relation Age of Onset  . Hypertension Mother    History  Substance Use Topics  . Smoking status: Current Every Day Smoker -- 0.50 packs/day    Types: Cigarettes  . Smokeless tobacco: Never Used  . Alcohol Use: Yes     Comment: 2x/week   OB History    Gravida Para Term Preterm AB TAB SAB Ectopic Multiple Living   4 1   3 2 1   1      Review of Systems  Constitutional: Positive for fever and fatigue. Negative for chills and diaphoresis.  HENT: Positive for congestion, postnasal drip and rhinorrhea. Negative for sneezing.   Eyes: Negative.   Respiratory: Positive for cough and shortness of breath. Negative for chest tightness.   Cardiovascular: Negative for chest pain and leg swelling.  Gastrointestinal: Negative for nausea, vomiting, abdominal pain, diarrhea and blood in stool.  Genitourinary: Negative for frequency, hematuria, flank pain and difficulty urinating.  Musculoskeletal: Positive for myalgias. Negative for back pain and arthralgias.  Skin: Negative for rash.  Neurological: Positive for headaches. Negative for dizziness, speech difficulty, weakness and numbness.      Allergies  Penicillins  Home Medications   Prior to Admission medications   Medication Sig Start Date End Date Taking? Authorizing Provider  famotidine (PEPCID) 20 MG tablet Take 1 tablet (20 mg total) by mouth 2 (two) times daily. Patient taking differently: Take 20  mg by mouth 2 (two) times daily as needed for heartburn.  10/26/13  Yes Clayton Bibles, PA-C  gabapentin (NEURONTIN) 300 MG capsule Take 900 mg by mouth every morning.    Yes Historical Provider, MD  guaiFENesin (MUCINEX) 600 MG 12 hr tablet Take 600 mg by mouth 2 (two) times daily as needed for cough or to loosen phlegm.   Yes Historical Provider, MD  omeprazole  (PRILOSEC) 20 MG capsule Take 1 capsule (20 mg total) by mouth daily. 10/26/13  Yes Clayton Bibles, PA-C  Phenylephrine-DM-GG-APAP (TYLENOL COLD/FLU SEVERE PO) Take 2 tablets by mouth daily as needed (cold).   Yes Historical Provider, MD  QUEtiapine (SEROQUEL) 400 MG tablet Take 400 mg by mouth at bedtime.   Yes Historical Provider, MD  sertraline (ZOLOFT) 100 MG tablet Take 200 mg by mouth daily.   Yes Historical Provider, MD  Simethicone (GAS RELIEF PO) Take 1 tablet by mouth 2 (two) times daily as needed (gas).   Yes Historical Provider, MD  benzonatate (TESSALON) 100 MG capsule Take 1 capsule (100 mg total) by mouth every 8 (eight) hours. 07/09/15   Malvin Johns, MD  oxyCODONE-acetaminophen (ROXICET) 5-325 MG per tablet Take 1-2 tablets by mouth every 4 (four) hours as needed for severe pain. Patient not taking: Reported on 07/09/2015 03/13/14   Coralie Keens, MD  predniSONE (DELTASONE) 20 MG tablet 2 tabs po daily x 3 days 07/09/15   Malvin Johns, MD   BP 165/75 mmHg  Pulse 91  Temp(Src) 98.5 F (36.9 C) (Oral)  Resp 18  SpO2 98%  LMP 06/30/2015 Physical Exam  Constitutional: She is oriented to person, place, and time. She appears well-developed and well-nourished.  HENT:  Head: Normocephalic and atraumatic.  Right Ear: External ear normal.  Left Ear: External ear normal.  Mouth/Throat: No oropharyngeal exudate.  Mild erythema posterior pharynx without exudates. Uvula is midline, no trismus  Eyes: Pupils are equal, round, and reactive to light.  Neck: Normal range of motion. Neck supple.  No meningismus  Cardiovascular: Normal rate, regular rhythm and normal heart sounds.   Pulmonary/Chest: Effort normal. No respiratory distress. She has wheezes. She has no rales. She exhibits no tenderness.  Positive rhonchi bilaterally with some expiratory wheezes bilaterally, no increased work of breathing  Abdominal: Soft. Bowel sounds are normal. There is no tenderness. There is no rebound and  no guarding.  Musculoskeletal: Normal range of motion. She exhibits no edema.  Lymphadenopathy:    She has no cervical adenopathy.  Neurological: She is alert and oriented to person, place, and time.  Skin: Skin is warm and dry. No rash noted.  Psychiatric: She has a normal mood and affect.    ED Course  Procedures (including critical care time) Labs Review Results for orders placed or performed during the hospital encounter of 07/09/15  Comprehensive metabolic panel  Result Value Ref Range   Sodium 140 135 - 145 mmol/L   Potassium 3.4 (L) 3.5 - 5.1 mmol/L   Chloride 106 101 - 111 mmol/L   CO2 25 22 - 32 mmol/L   Glucose, Bld 149 (H) 65 - 99 mg/dL   BUN 6 6 - 20 mg/dL   Creatinine, Ser 0.72 0.44 - 1.00 mg/dL   Calcium 9.3 8.9 - 10.3 mg/dL   Total Protein 8.1 6.5 - 8.1 g/dL   Albumin 3.6 3.5 - 5.0 g/dL   AST 28 15 - 41 U/L   ALT 42 14 - 54 U/L   Alkaline Phosphatase 89 38 -  126 U/L   Total Bilirubin 0.4 0.3 - 1.2 mg/dL   GFR calc non Af Amer >60 >60 mL/min   GFR calc Af Amer >60 >60 mL/min   Anion gap 9 5 - 15  CBC  Result Value Ref Range   WBC 10.8 (H) 4.0 - 10.5 K/uL   RBC 4.34 3.87 - 5.11 MIL/uL   Hemoglobin 11.8 (L) 12.0 - 15.0 g/dL   HCT 35.7 (L) 36.0 - 46.0 %   MCV 82.3 78.0 - 100.0 fL   MCH 27.2 26.0 - 34.0 pg   MCHC 33.1 30.0 - 36.0 g/dL   RDW 12.5 11.5 - 15.5 %   Platelets 407 (H) 150 - 400 K/uL  Urinalysis, Routine w reflex microscopic (not at [facility])  Result Value Ref Range   Color, Urine YELLOW YELLOW   APPearance CLOUDY (A) CLEAR   Specific Gravity, Urine 1.020 1.005 - 1.030   pH 6.0 5.0 - 8.0   Glucose, UA NEGATIVE NEGATIVE mg/dL   Hgb urine dipstick MODERATE (A) NEGATIVE   Bilirubin Urine NEGATIVE NEGATIVE   Ketones, ur NEGATIVE NEGATIVE mg/dL   Protein, ur NEGATIVE NEGATIVE mg/dL   Urobilinogen, UA 0.2 0.0 - 1.0 mg/dL   Nitrite NEGATIVE NEGATIVE   Leukocytes, UA MODERATE (A) NEGATIVE  Pregnancy, urine  Result Value Ref Range   Preg Test, Ur  NEGATIVE NEGATIVE  Urine microscopic-add on  Result Value Ref Range   Squamous Epithelial / LPF FEW (A) RARE   WBC, UA 0-2 <3 WBC/hpf   RBC / HPF 0-2 <3 RBC/hpf  I-Stat Troponin, ED (not at [facility])  Result Value Ref Range   Troponin i, poc 0.00 0.00 - 0.08 ng/mL   Comment 3           Dg Chest 2 View  07/09/2015   CLINICAL DATA:  Shortness of breath and cough for 1 week  EXAM: CHEST  2 VIEW  COMPARISON:  October 26, 2013  FINDINGS: Lungs are clear. Heart size and pulmonary vascularity are normal. No adenopathy. No bone lesions.  IMPRESSION: No edema or consolidation.   Electronically Signed   By: Lowella Grip III M.D.   On: 07/09/2015 14:08      Imaging Review Dg Chest 2 View  07/09/2015   CLINICAL DATA:  Shortness of breath and cough for 1 week  EXAM: CHEST  2 VIEW  COMPARISON:  October 26, 2013  FINDINGS: Lungs are clear. Heart size and pulmonary vascularity are normal. No adenopathy. No bone lesions.  IMPRESSION: No edema or consolidation.   Electronically Signed   By: Lowella Grip III M.D.   On: 07/09/2015 14:08     EKG Interpretation   Date/Time:  Saturday July 09 2015 12:34:23 EDT Ventricular Rate:  98 PR Interval:  130 QRS Duration: 98 QT Interval:  348 QTC Calculation: 444 R Axis:   57 Text Interpretation:  Sinus rhythm Borderline T wave abnormalities  Baseline wander in lead(s) III aVL aVF since last tracing no significant  change Confirmed by Athziri Freundlich  MD, Doylene Splinter ([id number]) on 07/09/2015 12:54:35 PM      MDM   Final diagnoses:  Cough  Bronchitis    Patient presents with cough and cold symptoms. She has no evidence of pneumonia. Her oxygen saturation saturations are normal. Her symptoms improved after nebulizer treatment in the ED. She has no suggestions of pulmonary embolus. She was discharged home in good condition with a prescription for a prednisone burst as well as an albuterol inhaler. She  was given [employer] I also advised her that her blood sugar  was elevated at 149 and she needs to have this followed up by primary care physician. She does have some leukocytes in her urine but no symptoms consistent with the UTI. It was sent for culture.  She does have some blood in her urine, but says that she is just coming off of her period.    Malvin Johns, MD 07/09/15 [place], MD 07/09/15 [phone]

## 2015-07-09 NOTE — Discharge Instructions (Signed)
Upper Respiratory Infection, Adult An upper respiratory infection (URI) is also sometimes known as the common cold. The upper respiratory tract includes the nose, sinuses, throat, trachea, and bronchi. Bronchi are the airways leading to the lungs. Most people improve within 1 week, but symptoms can last up to 2 weeks. A residual cough may last even longer.  CAUSES Many different viruses can infect the tissues lining the upper respiratory tract. The tissues become irritated and inflamed and often become very moist. Mucus production is also common. A cold is contagious. You can easily spread the virus to others by oral contact. This includes kissing, sharing a glass, coughing, or sneezing. Touching your mouth or nose and then touching a surface, which is then touched by another person, can also spread the virus. SYMPTOMS  Symptoms typically develop 1 to 3 days after you come in contact with a cold virus. Symptoms vary from person to person. They may include:  Runny nose.  Sneezing.  Nasal congestion.  Sinus irritation.  Sore throat.  Loss of voice (laryngitis).  Cough.  Fatigue.  Muscle aches.  Loss of appetite.  Headache.  Low-grade fever. DIAGNOSIS  You might diagnose your own cold based on familiar symptoms, since most people get a cold 2 to 3 times a year. Your caregiver can confirm this based on your exam. Most importantly, your caregiver can check that your symptoms are not due to another disease such as strep throat, sinusitis, pneumonia, asthma, or epiglottitis. Blood tests, throat tests, and X-rays are not necessary to diagnose a common cold, but they may sometimes be helpful in excluding other more serious diseases. Your caregiver will decide if any further tests are required. RISKS AND COMPLICATIONS  You may be at risk for a more severe case of the common cold if you smoke cigarettes, have chronic heart disease (such as heart failure) or lung disease (such as asthma), or if  you have a weakened immune system. The very young and very old are also at risk for more serious infections. Bacterial sinusitis, middle ear infections, and bacterial pneumonia can complicate the common cold. The common cold can worsen asthma and chronic obstructive pulmonary disease (COPD). Sometimes, these complications can require emergency medical care and may be life-threatening. PREVENTION  The best way to protect against getting a cold is to practice good hygiene. Avoid oral or hand contact with people with cold symptoms. Wash your hands often if contact occurs. There is no clear evidence that vitamin C, vitamin E, echinacea, or exercise reduces the chance of developing a cold. However, it is always recommended to get plenty of rest and practice good nutrition. TREATMENT  Treatment is directed at relieving symptoms. There is no cure. Antibiotics are not effective, because the infection is caused by a virus, not by bacteria. Treatment may include:  Increased fluid intake. Sports drinks offer valuable electrolytes, sugars, and fluids.  Breathing heated mist or steam (vaporizer or shower).  Eating chicken soup or other clear broths, and maintaining good nutrition.  Getting plenty of rest.  Using gargles or lozenges for comfort.  Controlling fevers with ibuprofen or acetaminophen as directed by your caregiver.  Increasing usage of your inhaler if you have asthma. Zinc gel and zinc lozenges, taken in the first 24 hours of the common cold, can shorten the duration and lessen the severity of symptoms. Pain medicines may help with fever, muscle aches, and throat pain. A variety of non-prescription medicines are available to treat congestion and runny nose. Your caregiver   can make recommendations and may suggest nasal or lung inhalers for other symptoms.  HOME CARE INSTRUCTIONS   Only take over-the-counter or prescription medicines for pain, discomfort, or fever as directed by your  caregiver.  Use a warm mist humidifier or inhale steam from a shower to increase air moisture. This may keep secretions moist and make it easier to breathe.  Drink enough water and fluids to keep your urine clear or pale yellow.  Rest as needed.  Return to work when your temperature has returned to normal or as your caregiver advises. You may need to stay home longer to avoid infecting others. You can also use a face mask and careful hand washing to prevent spread of the virus. SEEK MEDICAL CARE IF:   After the first few days, you feel you are getting worse rather than better.  You need your caregiver's advice about medicines to control symptoms.  You develop chills, worsening shortness of breath, or brown or red sputum. These may be signs of pneumonia.  You develop yellow or brown nasal discharge or pain in the face, especially when you bend forward. These may be signs of sinusitis.  You develop a fever, swollen neck glands, pain with swallowing, or white areas in the back of your throat. These may be signs of strep throat. SEEK IMMEDIATE MEDICAL CARE IF:   You have a fever.  You develop severe or persistent headache, ear pain, sinus pain, or chest pain.  You develop wheezing, a prolonged cough, cough up blood, or have a change in your usual mucus (if you have chronic lung disease).  You develop sore muscles or a stiff neck. Document Released: 05/29/2001 Document Revised: 02/25/2012 Document Reviewed: 03/10/2014 Exit[employer] Patient Information 2015 [employer]. This information is not intended to replace advice given to you by your health care provider. Make sure you discuss any questions you have with your health care provider.  

## 2015-07-09 NOTE — ED Notes (Signed)
Pt c/o CP, SOB, cough, headache, vomiting x 1 wk.  NAD.

## 2015-07-11 LAB — URINE CULTURE
Culture: >=100000
Special Requests: NORMAL

## 2015-07-12 ENCOUNTER — Telehealth (HOSPITAL_COMMUNITY): Payer: Self-pay

## 2015-07-12 NOTE — ED Notes (Signed)
Post ED Visit - Positive Culture Follow-up  Culture report reviewed by antimicrobial stewardship pharmacist: []  Wes Dulaney, Pharm.D., BCPS []  Heide Guile, Pharm.D., BCPS []  Alycia Rossetti, Pharm.D., BCPS []  [place], [place].D., BCPS, AAHIVP []  Legrand Como, Pharm.D., BCPS, AAHIVP []  Isac Sarna, Pharm.D., BCPS X  [facility]   Positive urine culture Treated with cipro, organism sensitive to the same and no further patient follow-up is required at this time.  Ileene Musa 07/12/2015, 11:44 AM

## 2016-07-18 ENCOUNTER — Emergency Department (HOSPITAL_COMMUNITY)
Admission: EM | Admit: 2016-07-18 | Discharge: 2016-07-18 | Disposition: A | Discharge status: Home / Self Care | Payer: No Typology Code available for payment source | Attending: Emergency Medicine | Admitting: Emergency Medicine

## 2016-07-18 ENCOUNTER — Encounter (HOSPITAL_COMMUNITY): Payer: Self-pay | Admitting: Emergency Medicine

## 2016-07-18 DIAGNOSIS — Z792 Long term (current) use of antibiotics: Secondary | ICD-10-CM | POA: Insufficient documentation

## 2016-07-18 DIAGNOSIS — F1290 Cannabis use, unspecified, uncomplicated: Secondary | ICD-10-CM | POA: Insufficient documentation

## 2016-07-18 DIAGNOSIS — F17210 Nicotine dependence, cigarettes, uncomplicated: Secondary | ICD-10-CM | POA: Insufficient documentation

## 2016-07-18 DIAGNOSIS — L259 Unspecified contact dermatitis, unspecified cause: Secondary | ICD-10-CM | POA: Insufficient documentation

## 2016-07-18 DIAGNOSIS — R21 Rash and other nonspecific skin eruption: Secondary | ICD-10-CM | POA: Insufficient documentation

## 2016-07-18 DIAGNOSIS — Z79899 Other long term (current) drug therapy: Secondary | ICD-10-CM | POA: Insufficient documentation

## 2016-07-18 MED ORDER — METHYLPREDNISOLONE SODIUM SUCC 125 MG IJ SOLR
80.0000 mg | Freq: Once | INTRAMUSCULAR | Status: AC
  Administered 2016-07-18: 80 mg via INTRAMUSCULAR
  Filled 2016-07-18: qty 2

## 2016-07-18 MED ORDER — DIPHENHYDRAMINE HCL 25 MG PO CAPS
25.0000 mg | ORAL_CAPSULE | Freq: Once | ORAL | Status: AC
Start: 2016-07-18 — End: 2016-07-18
  Administered 2016-07-18: 25 mg via ORAL
  Filled 2016-07-18: qty 1

## 2016-07-18 MED ORDER — FAMOTIDINE 20 MG PO TABS
20.0000 mg | ORAL_TABLET | Freq: Once | ORAL | Status: AC
Start: 2016-07-18 — End: 2016-07-18
  Administered 2016-07-18: 20 mg via ORAL
  Filled 2016-07-18: qty 1

## 2016-07-18 NOTE — Discharge Instructions (Signed)
Benadryl and/or Pepcid as needed for itching. Continue hydrocortisone cream as needed. Follow-up with your primary care provider at the next available appointment to discuss today's diagnosis. Return to ER for shortness of breath, difficulty swallowing, new or worsening symptoms, any additional concerns.

## 2016-07-18 NOTE — ED Provider Notes (Signed)
[facility] DEPT Provider Note   CSN: [id number] Arrival date & time: 07/18/16  1616  First Provider Contact:  First MD Initiated Contact with Patient 07/18/16 1708     By signing my name below, I, Reola Mosher, attest that this documentation has been prepared under the direction and in the presence of [facility], PA-C.  Electronically Signed: Reola Mosher, ED Scribe. 07/18/16. 5:16 PM.  History   Chief Complaint Chief Complaint  Patient presents with  . Urticaria   The history is provided by the patient. No language interpreter was used.   HPI Comments: Amy Yoder is a 30 y.o. female who presents to the Emergency Department complaining of sudden onset, gradually spreading pruritic rash across her bilateral upper extremities x ~5 days. Pt reports that she recently switched to a new ADHD medication ~2 weeks prior to the onset of her symptoms. Otherwise, no new soaps, lotions, detergents, foods, animals, or plants. She has been applying Hydrocortisone cream w/ minimal improvement of her symptoms. Pt notes that she had a similar rx to Penicillin "years ago". No contact w/ similar sx. Pt has had varicella zoster in the past. Denies SOB, trouble swallowing, tongue swelling, fever, chills, or any other associated symptoms.   Past Medical History:  Diagnosis Date  . Bipolar I disorder ([place])   . Bronchitis   . Depression   . Marijuana use   . Tobacco use    Patient Active Problem List   Diagnosis Date Noted  . Symptomatic cholelithiasis s/p lap chole 03/12/2014 03/11/2014   Past Surgical History:  Procedure Laterality Date  . CHOLECYSTECTOMY N/A 03/12/2014   Procedure: LAPAROSCOPIC CHOLECYSTECTOMY WITH INTRAOPERATIVE CHOLANGIOGRAM;  Surgeon: Merrie Roof, MD;  Location: [facility];  Service: General;  Laterality: N/A;  . DENTAL SURGERY    . DILATION AND CURETTAGE OF UTERUS     OB History    Gravida Para Term Preterm AB Living   4 1     3 1    SAB TAB  Ectopic Multiple Live Births   1 2           Home Medications    Prior to Admission medications   Medication Sig Start Date End Date Taking? Authorizing Provider  benzonatate (TESSALON) 100 MG capsule Take 1 capsule (100 mg total) by mouth every 8 (eight) hours. 07/09/15   Malvin Johns, MD  ciprofloxacin (CIPRO) 500 MG tablet Take 1 tablet (500 mg total) by mouth 2 (two) times daily. One po bid x 7 days 07/09/15   Malvin Johns, MD  famotidine (PEPCID) 20 MG tablet Take 1 tablet (20 mg total) by mouth 2 (two) times daily. Patient taking differently: Take 20 mg by mouth 2 (two) times daily as needed for heartburn.  10/26/13   Clayton Bibles, PA-C  gabapentin (NEURONTIN) 300 MG capsule Take 900 mg by mouth every morning.     Historical Provider, MD  guaiFENesin (MUCINEX) 600 MG 12 hr tablet Take 600 mg by mouth 2 (two) times daily as needed for cough or to loosen phlegm.    Historical Provider, MD  omeprazole (PRILOSEC) 20 MG capsule Take 1 capsule (20 mg total) by mouth daily. 10/26/13   Clayton Bibles, PA-C  oxyCODONE-acetaminophen (ROXICET) 5-325 MG per tablet Take 1-2 tablets by mouth every 4 (four) hours as needed for severe pain. Patient not taking: Reported on 07/09/2015 03/13/14   Coralie Keens, MD  Phenylephrine-DM-GG-APAP (TYLENOL COLD/FLU SEVERE PO) Take 2 tablets by mouth daily as needed (  cold).    Historical Provider, MD  predniSONE (DELTASONE) 20 MG tablet 2 tabs po daily x 3 days 07/09/15   Malvin Johns, MD  QUEtiapine (SEROQUEL) 400 MG tablet Take 400 mg by mouth at bedtime.    Historical Provider, MD  sertraline (ZOLOFT) 100 MG tablet Take 200 mg by mouth daily.    Historical Provider, MD  Simethicone (GAS RELIEF PO) Take 1 tablet by mouth 2 (two) times daily as needed (gas).    Historical Provider, MD   Family History Family History  Problem Relation Age of Onset  . Hypertension Mother    Social History Social History  Substance Use Topics  . Smoking status: Current Every Day  Smoker    Packs/day: 0.50    Types: Cigarettes  . Smokeless tobacco: Never Used  . Alcohol use Yes     Comment: 2x/week   Allergies   Penicillins  Review of Systems Review of Systems  Constitutional: Negative for chills and fever.  HENT: Negative for trouble swallowing.   Respiratory: Negative for shortness of breath.   Skin: Positive for rash.   Physical Exam Updated Vital Signs BP 130/64   Pulse 74   Temp 98.7 F (37.1 C) (Oral)   Resp 16   LMP 06/22/2016 (Approximate)   SpO2 98%   Physical Exam  Constitutional: She is oriented to person, place, and time. She appears well-developed and well-nourished. No distress.  HENT:  Head: Normocephalic and atraumatic.  Cardiovascular: Normal rate, regular rhythm, normal heart sounds and intact distal pulses.  Exam reveals no gallop and no friction rub.   No murmur heard. Pulmonary/Chest: Effort normal and breath sounds normal. No respiratory distress. She has no wheezes. She has no rales. She exhibits no tenderness.  Abdominal: Soft. Bowel sounds are normal. She exhibits no distension. There is no tenderness.  Musculoskeletal: She exhibits no edema.  Neurological: She is alert and oriented to person, place, and time.  Skin: Skin is warm and dry. Capillary refill takes less than 2 seconds. Rash (Right forearm and left hand) noted.  Nursing note and vitals reviewed.  ED Treatments / Results  DIAGNOSTIC STUDIES: Oxygen Saturation is 97% on RA, normal by my interpretation.   COORDINATION OF CARE: 5:15 PM-Discussed next steps with pt. Pt verbalized understanding and is agreeable with the plan.   Procedures Procedures (including critical care time)  Medications Ordered in ED Medications  methylPREDNISolone sodium succinate (SOLU-MEDROL) 125 mg/2 mL injection 80 mg (80 mg Intramuscular Given 07/18/16 1733)  diphenhydrAMINE (BENADRYL) capsule 25 mg (25 mg Oral Given 07/18/16 1730)  famotidine (PEPCID) tablet 20 mg (20 mg Oral Given  07/18/16 1730)    Initial Impression / Assessment and Plan / ED Course  I have reviewed the triage vital signs and the nursing notes.  Pertinent labs & imaging results that were available during my care of the patient were reviewed by me and considered in my medical decision making (see chart for details).  Clinical Course    Amy Yoder is a 30 y.o. female who presents to the Emergency Department complaining of rash consistent with contact dermatitis. Patient denies any difficulty breathing or swallowing. Pt has a patent airway without stridor and is handling secretions without difficulty; no angioedema. No blisters, no pustules, no warmth, no draining sinus tracts, no superficial abscesses, no bullous impetigo, no vesicles, no desquamation, no target lesions with dusky purpura or a central bulla. Not tender to touch. No concern for superimposed infection. No concern for SJS,  TEN, TSS, tick borne illness, syphilis or other life-threatening condition. Solu-medrol given in ED. Pepcid and Benadryl PRN as needed for pruritis.  Final Clinical Impressions(s) / ED Diagnoses   Final diagnoses:  Rash   New Prescriptions Discharge Medication List as of 07/18/2016  6:08 PM     I personally performed the services described in this documentation, which was scribed in my presence. The recorded information has been reviewed and is accurate.    [facility], PA-C 07/18/16 1830    Malvin Johns, MD 07/18/16 [phone]

## 2016-07-18 NOTE — ED Triage Notes (Signed)
Patient presents with hives to bilateral arms and legs, upper chest x5 days. Denies difficulty swallowing. Patient recent new medication for ADHD. No vocal changes, speaking in complete sentences.

## 2016-12-26 ENCOUNTER — Emergency Department (HOSPITAL_COMMUNITY)
Admission: EM | Admit: 2016-12-26 | Discharge: 2016-12-26 | Disposition: A | Discharge status: Home / Self Care | Payer: No Typology Code available for payment source | Attending: Emergency Medicine | Admitting: Emergency Medicine

## 2016-12-26 ENCOUNTER — Encounter (HOSPITAL_COMMUNITY): Payer: Self-pay | Admitting: Emergency Medicine

## 2016-12-26 DIAGNOSIS — J111 Influenza due to unidentified influenza virus with other respiratory manifestations: Secondary | ICD-10-CM | POA: Insufficient documentation

## 2016-12-26 DIAGNOSIS — Z79899 Other long term (current) drug therapy: Secondary | ICD-10-CM | POA: Insufficient documentation

## 2016-12-26 DIAGNOSIS — R69 Illness, unspecified: Secondary | ICD-10-CM

## 2016-12-26 DIAGNOSIS — F17210 Nicotine dependence, cigarettes, uncomplicated: Secondary | ICD-10-CM | POA: Insufficient documentation

## 2016-12-26 LAB — CBC
HCT: 37.5 % (ref 36.0–46.0)
Hemoglobin: 12.4 g/dL (ref 12.0–15.0)
MCH: 27.1 pg (ref 26.0–34.0)
MCHC: 33.1 g/dL (ref 30.0–36.0)
MCV: 81.9 fL (ref 78.0–100.0)
Platelets: 326 10*3/uL (ref 150–400)
RBC: 4.58 MIL/uL (ref 3.87–5.11)
RDW: 13 % (ref 11.5–15.5)
WBC: 4.5 10*3/uL (ref 4.0–10.5)

## 2016-12-26 LAB — COMPREHENSIVE METABOLIC PANEL
ALK PHOS: 75 U/L (ref 38–126)
ALT: 21 U/L (ref 14–54)
AST: 25 U/L (ref 15–41)
Albumin: 4.1 g/dL (ref 3.5–5.0)
Anion gap: 6 (ref 5–15)
BUN: 8 mg/dL (ref 6–20)
CALCIUM: 9.1 mg/dL (ref 8.9–10.3)
CHLORIDE: 106 mmol/L (ref 101–111)
CO2: 23 mmol/L (ref 22–32)
CREATININE: 0.86 mg/dL (ref 0.44–1.00)
Glucose, Bld: 105 mg/dL — ABNORMAL HIGH (ref 65–99)
Potassium: 3.9 mmol/L (ref 3.5–5.1)
Sodium: 135 mmol/L (ref 135–145)
Total Bilirubin: 0.3 mg/dL (ref 0.3–1.2)
Total Protein: 7.7 g/dL (ref 6.5–8.1)

## 2016-12-26 LAB — URINALYSIS, ROUTINE W REFLEX MICROSCOPIC
BILIRUBIN URINE: NEGATIVE
Glucose, UA: NEGATIVE mg/dL
Hgb urine dipstick: NEGATIVE
KETONES UR: NEGATIVE mg/dL
Leukocytes, UA: NEGATIVE
Nitrite: NEGATIVE
PH: 5 (ref 5.0–8.0)
Protein, ur: 30 mg/dL — AB
Specific Gravity, Urine: 1.031 — ABNORMAL HIGH (ref 1.005–1.030)

## 2016-12-26 LAB — LIPASE, BLOOD: LIPASE: 22 U/L (ref 11–51)

## 2016-12-26 LAB — I-STAT BETA HCG BLOOD, ED (MC, WL, AP ONLY): I-stat hCG, quantitative: <5 m[IU]/mL (ref <5)

## 2016-12-26 MED ORDER — ONDANSETRON HCL 4 MG PO TABS: 4.0000 mg | ORAL_TABLET | Freq: Four times a day (QID) | ORAL | 0 refills | Status: DC

## 2016-12-26 MED ORDER — BENZONATATE 100 MG PO CAPS: 200.0000 mg | ORAL_CAPSULE | Freq: Two times a day (BID) | ORAL | 0 refills | Status: DC | PRN

## 2016-12-26 NOTE — ED Notes (Signed)
Pt now in room.   

## 2016-12-26 NOTE — ED Provider Notes (Signed)
[facility] DEPT Provider Note   CSN: [id number] Arrival date & time: 12/26/16  [id number]     History   Chief Complaint Chief Complaint  Patient presents with  . Generalized Body Aches  . Emesis  . Dysuria    HPI Amy Yoder is a 31 y.o. female.  Patient presents to the emergency department with chief complaint of flulike symptoms. She reports fever, chills, body aches, nausea, vomiting, cough, and dysuria for the past several days. She states that she had some dysuria about a month ago, and used some leftover antibiotics with good relief. She does not remember which antibiotics they were. She denies any chest pain or abdominal pain. There are no modifying factors. She did not get a flu shot. There are no additional associated symptoms.   The history is provided by the patient. No language interpreter was used.    Past Medical History:  Diagnosis Date  . Bipolar I disorder ([place])   . Bronchitis   . Depression   . Marijuana use   . Tobacco use     Patient Active Problem List   Diagnosis Date Noted  . Symptomatic cholelithiasis s/p lap chole 03/12/2014 03/11/2014    Past Surgical History:  Procedure Laterality Date  . CHOLECYSTECTOMY N/A 03/12/2014   Procedure: LAPAROSCOPIC CHOLECYSTECTOMY WITH INTRAOPERATIVE CHOLANGIOGRAM;  Surgeon: Merrie Roof, MD;  Location: [facility];  Service: General;  Laterality: N/A;  . DENTAL SURGERY    . DILATION AND CURETTAGE OF UTERUS      OB History    Gravida Para Term Preterm AB Living   4 1     3 1    SAB TAB Ectopic Multiple Live Births   1 2             Home Medications    Prior to Admission medications   Medication Sig Start Date End Date Taking? Authorizing Provider  benzonatate (TESSALON) 100 MG capsule Take 1 capsule (100 mg total) by mouth every 8 (eight) hours. 07/09/15   Malvin Johns, MD  ciprofloxacin (CIPRO) 500 MG tablet Take 1 tablet (500 mg total) by mouth 2 (two) times daily. One po bid x 7 days 07/09/15    Malvin Johns, MD  famotidine (PEPCID) 20 MG tablet Take 1 tablet (20 mg total) by mouth 2 (two) times daily. Patient taking differently: Take 20 mg by mouth 2 (two) times daily as needed for heartburn.  10/26/13   Clayton Bibles, PA-C  gabapentin (NEURONTIN) 300 MG capsule Take 900 mg by mouth every morning.     Historical Provider, MD  guaiFENesin (MUCINEX) 600 MG 12 hr tablet Take 600 mg by mouth 2 (two) times daily as needed for cough or to loosen phlegm.    Historical Provider, MD  omeprazole (PRILOSEC) 20 MG capsule Take 1 capsule (20 mg total) by mouth daily. 10/26/13   Clayton Bibles, PA-C  oxyCODONE-acetaminophen (ROXICET) 5-325 MG per tablet Take 1-2 tablets by mouth every 4 (four) hours as needed for severe pain. Patient not taking: Reported on 07/09/2015 03/13/14   Coralie Keens, MD  Phenylephrine-DM-GG-APAP (TYLENOL COLD/FLU SEVERE PO) Take 2 tablets by mouth daily as needed (cold).    Historical Provider, MD  predniSONE (DELTASONE) 20 MG tablet 2 tabs po daily x 3 days 07/09/15   Malvin Johns, MD  QUEtiapine (SEROQUEL) 400 MG tablet Take 400 mg by mouth at bedtime.    Historical Provider, MD  sertraline (ZOLOFT) 100 MG tablet Take 200 mg by mouth daily.  Historical Provider, MD  Simethicone (GAS RELIEF PO) Take 1 tablet by mouth 2 (two) times daily as needed (gas).    Historical Provider, MD    Family History Family History  Problem Relation Age of Onset  . Hypertension Mother     Social History Social History  Substance Use Topics  . Smoking status: Current Every Day Smoker    Packs/day: 0.50    Types: Cigarettes  . Smokeless tobacco: Never Used  . Alcohol use Yes     Comment: 2x/week     Allergies   Penicillins   Review of Systems Review of Systems  Constitutional: Positive for chills and fever.  HENT: Positive for postnasal drip, rhinorrhea, sinus pressure, sneezing and sore throat.   Respiratory: Positive for cough. Negative for shortness of breath.     Cardiovascular: Negative for chest pain.  Gastrointestinal: Negative for abdominal pain, constipation, diarrhea, nausea and vomiting.  Genitourinary: Positive for dysuria.  All other systems reviewed and are negative.    Physical Exam Updated Vital Signs BP (!) 120/52 (BP Location: Left Arm)   Pulse 97   Temp 99.3 F (37.4 C) (Oral)   Resp 18   LMP 11/27/2016   SpO2 100%   Physical Exam  Constitutional: She is oriented to person, place, and time. She appears well-developed and well-nourished.  HENT:  Head: Normocephalic and atraumatic.  Eyes: Conjunctivae and EOM are normal. Pupils are equal, round, and reactive to light.  Neck: Normal range of motion. Neck supple.  Cardiovascular: Normal rate and regular rhythm.  Exam reveals no gallop and no friction rub.   No murmur heard. Pulmonary/Chest: Effort normal and breath sounds normal. No respiratory distress. She has no wheezes. She has no rales. She exhibits no tenderness.  Abdominal: Soft. Bowel sounds are normal. She exhibits no distension and no mass. There is no tenderness. There is no rebound and no guarding.  Musculoskeletal: Normal range of motion. She exhibits no edema or tenderness.  Neurological: She is alert and oriented to person, place, and time.  Skin: Skin is warm and dry.  Psychiatric: She has a normal mood and affect. Her behavior is normal. Judgment and thought content normal.  Nursing note and vitals reviewed.    ED Treatments / Results  Labs (all labs ordered are listed, but only abnormal results are displayed) Labs Reviewed  COMPREHENSIVE METABOLIC PANEL - Abnormal; Notable for the following:       Result Value   Glucose, Bld 105 (*)    All other components within normal limits  LIPASE, BLOOD  CBC  URINALYSIS, ROUTINE W REFLEX MICROSCOPIC  I-STAT BETA HCG BLOOD, ED ([facility], [facility], [facility] ONLY)    EKG  EKG Interpretation None       Radiology No results found.  Procedures Procedures (including  critical care time)  Medications Ordered in ED Medications - No data to display   Initial Impression / Assessment and Plan / ED Course  I have reviewed the triage vital signs and the nursing notes.  Pertinent labs & imaging results that were available during my care of the patient were reviewed by me and considered in my medical decision making (see chart for details).  Clinical Course     Patient with symptoms consistent with influenza.  Vitals are stable, low-grade fever.  No signs of dehydration, tolerating PO's.  Lungs are clear. Due to patient's presentation and physical exam a chest x-ray was not ordered bc likely diagnosis of flu.  Patient will be discharged  with instructions to orally hydrate, rest, and use over-the-counter medications such as anti-inflammatories ibuprofen and Aleve for muscle aches and Tylenol for fever.  Patient will also be given a cough suppressant.    Final Clinical Impressions(s) / ED Diagnoses   Final diagnoses:  Influenza-like illness    New Prescriptions Discharge Medication List as of 12/26/2016 11:21 AM    START taking these medications   Details  benzonatate (TESSALON) 100 MG capsule Take 2 capsules (200 mg total) by mouth 2 (two) times daily as needed for cough., Starting Wed 12/26/2016, Print    ondansetron (ZOFRAN) 4 MG tablet Take 1 tablet (4 mg total) by mouth every 6 (six) hours., Starting Wed 12/26/2016, Print         [name], PA-C 12/26/16 [place], MD 12/26/16 [phone]

## 2016-12-26 NOTE — ED Notes (Addendum)
Pt has to see boyfriend to get keys for car. Pt ambulating down hallway down to triage area. Belongings in rooms.

## 2016-12-26 NOTE — ED Triage Notes (Signed)
Patient c/o body aches, vomiting chills, fever yesterday but dont know how high because didn't check it. Patient also c/o dysuria for weeks. Patient states that she took someone else's antibiotics because she thought she had UTI and googled that it could help with symptoms. Then she bought OTC meds and turned urine orange and symptoms are fine.

## 2016-12-26 NOTE — ED Notes (Signed)
EDPA ROBERT Provider at bedside. PT not in room. Belongings remain in room

## 2016-12-27 LAB — URINE CULTURE: Culture: <10000 — AB

## 2018-01-02 ENCOUNTER — Emergency Department (HOSPITAL_COMMUNITY)
Admission: EM | Admit: 2018-01-02 | Discharge: 2018-01-02 | Discharge status: Against Medical Advice | Payer: No Typology Code available for payment source

## 2018-01-02 NOTE — ED Notes (Signed)
When patient called for triage she informed nurse that she cannot wait this long and will return tomorrow. Patient ambulatory, speaking in full sentences and in NAD.

## 2018-01-03 ENCOUNTER — Emergency Department (HOSPITAL_COMMUNITY)
Admission: EM | Admit: 2018-01-03 | Discharge: 2018-01-03 | Disposition: A | Discharge status: Home / Self Care | Payer: BLUE CROSS/BLUE SHIELD | Attending: Emergency Medicine | Admitting: Emergency Medicine

## 2018-01-03 ENCOUNTER — Other Ambulatory Visit: Payer: Self-pay

## 2018-01-03 ENCOUNTER — Encounter (HOSPITAL_COMMUNITY): Payer: Self-pay | Admitting: *Deleted

## 2018-01-03 ENCOUNTER — Emergency Department (HOSPITAL_COMMUNITY): Payer: BLUE CROSS/BLUE SHIELD

## 2018-01-03 DIAGNOSIS — K0889 Other specified disorders of teeth and supporting structures: Secondary | ICD-10-CM | POA: Insufficient documentation

## 2018-01-03 DIAGNOSIS — F17210 Nicotine dependence, cigarettes, uncomplicated: Secondary | ICD-10-CM | POA: Diagnosis not present

## 2018-01-03 DIAGNOSIS — J069 Acute upper respiratory infection, unspecified: Secondary | ICD-10-CM | POA: Diagnosis not present

## 2018-01-03 DIAGNOSIS — R05 Cough: Secondary | ICD-10-CM | POA: Diagnosis present

## 2018-01-03 MED ORDER — HYDROCODONE-ACETAMINOPHEN 5-325 MG PO TABS
2.0000 | ORAL_TABLET | Freq: Once | ORAL | Status: AC
  Administered 2018-01-03: 2 via ORAL
  Filled 2018-01-03: qty 2

## 2018-01-03 MED ORDER — HYDROCODONE-ACETAMINOPHEN 5-325 MG PO TABS: 2.0000 | ORAL_TABLET | ORAL | 0 refills | Status: DC | PRN

## 2018-01-03 MED ORDER — CLINDAMYCIN HCL 150 MG PO CAPS: 150.0000 mg | ORAL_CAPSULE | Freq: Four times a day (QID) | ORAL | 0 refills | Status: DC

## 2018-01-03 NOTE — Discharge Instructions (Signed)
Return if any problems.

## 2018-01-03 NOTE — ED Provider Notes (Signed)
[facility] EMERGENCY DEPARTMENT Provider Note   CSN: [id number] Arrival date & time: 01/03/18  1522     History   Chief Complaint Chief Complaint  Patient presents with  . Cough  . Headache    HPI Amy Yoder is a 32 y.o. female.  The history is provided by the patient. No language interpreter was used.  Cough  This is a new problem. The problem occurs constantly. The problem has been gradually worsening. The cough is non-productive. There has been no fever. Associated symptoms include headaches. She has tried nothing for the symptoms. She is not a smoker. Her past medical history does not include pneumonia.  Headache    Pt complains of a cough and pain in her jaw.  Pt thinks she needs dental care.   Past Medical History:  Diagnosis Date  . Bipolar I disorder ([place])   . Bronchitis   . Depression   . Marijuana use   . Tobacco use     Patient Active Problem List   Diagnosis Date Noted  . Symptomatic cholelithiasis s/p lap chole 03/12/2014 03/11/2014    Past Surgical History:  Procedure Laterality Date  . CHOLECYSTECTOMY N/A 03/12/2014   Procedure: LAPAROSCOPIC CHOLECYSTECTOMY WITH INTRAOPERATIVE CHOLANGIOGRAM;  Surgeon: Merrie Roof, MD;  Location: [facility];  Service: General;  Laterality: N/A;  . DENTAL SURGERY    . DILATION AND CURETTAGE OF UTERUS      OB History    Gravida Para Term Preterm AB Living   4 1     3 1    SAB TAB Ectopic Multiple Live Births   1 2             Home Medications    Prior to Admission medications   Medication Sig Start Date End Date Taking? Authorizing Provider  acetaminophen (TYLENOL) 500 MG tablet Take 500-1,000 mg by mouth every 6 (six) hours as needed for moderate pain or fever.    [provider]  benzonatate (TESSALON) 100 MG capsule Take 2 capsules (200 mg total) by mouth 2 (two) times daily as needed for cough. 12/26/16   [name], PA-C  famotidine (PEPCID) 20 MG tablet Take 1  tablet (20 mg total) by mouth 2 (two) times daily. Patient not taking: Reported on 12/26/2016 10/26/13   Clayton Bibles, PA-C  gabapentin (NEURONTIN) 300 MG capsule Take 900 mg by mouth every morning.     [provider]  omeprazole (PRILOSEC) 20 MG capsule Take 1 capsule (20 mg total) by mouth daily. Patient taking differently: Take 20 mg by mouth daily as needed (heartburn).  10/26/13   Clayton Bibles, PA-C  ondansetron (ZOFRAN) 4 MG tablet Take 1 tablet (4 mg total) by mouth every 6 (six) hours. 12/26/16   [name], PA-C  QUEtiapine (SEROQUEL) 300 MG tablet Take 300 mg by mouth at bedtime.    [provider]  sertraline (ZOLOFT) 100 MG tablet Take 200 mg by mouth daily.    [provider]    Family History Family History  Problem Relation Age of Onset  . Hypertension Mother     Social History Social History   Tobacco Use  . Smoking status: Current Every Day Smoker    Packs/day: 0.50    Types: Cigarettes  . Smokeless tobacco: Never Used  Substance Use Topics  . Alcohol use: Yes    Comment: 2x/week  . Drug use: Yes    Comment: occasional Marijuana use  Allergies   Penicillins   Review of Systems Review of Systems  Respiratory: Positive for cough.   Neurological: Positive for headaches.  All other systems reviewed and are negative.    Physical Exam Updated Vital Signs BP (!) 150/80 (BP Location: Left Arm)   Pulse 68   Temp 98.3 F (36.8 C) (Oral)   Resp 18   LMP 01/03/2018   SpO2 99%   Physical Exam  Constitutional: She appears well-developed and well-nourished. No distress.  HENT:  Head: Normocephalic and atraumatic.  Mouth/Throat: Oropharynx is clear and moist.  Eyes: Conjunctivae and EOM are normal. Pupils are equal, round, and reactive to light.  Neck: Neck supple.  Cardiovascular: Normal rate and regular rhythm.  No murmur heard. Pulmonary/Chest: Effort normal and breath sounds normal. No respiratory distress.    Abdominal: Soft. There is no tenderness.  Musculoskeletal: She exhibits no edema.  Neurological: She is alert.  Skin: Skin is warm and dry.  Psychiatric: She has a normal mood and affect.  Nursing note and vitals reviewed.    ED Treatments / Results  Labs (all labs ordered are listed, but only abnormal results are displayed) Labs Reviewed - No data to display  EKG  EKG Interpretation None       Radiology Dg Chest 2 View  Result Date: 01/03/2018 CLINICAL DATA:  Headache pain in the throat and chest EXAM: CHEST  2 VIEW COMPARISON:  07/09/2015 FINDINGS: The heart size and mediastinal contours are within normal limits. Both lungs are clear. The visualized skeletal structures are unremarkable. IMPRESSION: No active cardiopulmonary disease. Electronically Signed   By: Donavan Foil M.D.   On: 01/03/2018 16:46    Procedures Procedures (including critical care time)  Medications Ordered in ED Medications - No data to display   Initial Impression / Assessment and Plan / ED Course  I have reviewed the triage vital signs and the nursing notes.  Pertinent labs & imaging results that were available during my care of the patient were reviewed by me and considered in my medical decision making (see chart for details).       Final Clinical Impressions(s) / ED Diagnoses   Final diagnoses:  Upper respiratory tract infection, unspecified type  Pain, dental    ED Discharge Orders        Ordered    clindamycin (CLEOCIN) 150 MG capsule  Every 6 hours     01/03/18 1654    HYDROcodone-acetaminophen (NORCO/VICODIN) 5-325 MG tablet  Every 4 hours PRN     01/03/18 1654       Sidney Ace 01/03/18 1655    Noemi Chapel, MD 01/04/18 [phone]

## 2018-01-03 NOTE — ED Triage Notes (Signed)
Pt reports several days of "feeling like she was getting sick." has excruciating headache that is radiating into her throat and chest. Had recent cough. Pt is tearful and anxious at triage. States she had similar pain in past and ended up having teeth pulled.

## 2018-01-03 NOTE — ED Provider Notes (Signed)
Patient placed in Quick Look pathway, seen and evaluated   Chief Complaint: cough  HPI:   Pt here with four days of cough and upper respiratory congestion. No fever. Minor chest tightness.   ROS: postive for cough (one)  Physical Exam:   Gen: No distress  Neuro: Awake and Alert  Skin: Warm    Focused Exam: pulmonary; clear lung sounds    Initiation of care has begun. The patient has been counseled on the process, plan, and necessity for staying for the completion/evaluation, and the remainder of the medical screening examination    Francee Gentile 01/03/18 1601    Noemi Chapel, MD 01/04/18 [phone]

## 2018-01-14 ENCOUNTER — Encounter (HOSPITAL_COMMUNITY): Payer: Self-pay | Admitting: Certified Nurse Midwife

## 2018-01-14 ENCOUNTER — Inpatient Hospital Stay (HOSPITAL_COMMUNITY)
Admission: AD | Admit: 2018-01-14 | Discharge: 2018-01-14 | Disposition: A | Discharge status: Home / Self Care | Payer: BLUE CROSS/BLUE SHIELD | Source: Ambulatory Visit | Attending: Obstetrics and Gynecology | Admitting: Obstetrics and Gynecology

## 2018-01-14 DIAGNOSIS — N938 Other specified abnormal uterine and vaginal bleeding: Secondary | ICD-10-CM | POA: Insufficient documentation

## 2018-01-14 DIAGNOSIS — R1030 Lower abdominal pain, unspecified: Secondary | ICD-10-CM | POA: Diagnosis not present

## 2018-01-14 DIAGNOSIS — Z880 Allergy status to penicillin: Secondary | ICD-10-CM | POA: Insufficient documentation

## 2018-01-14 DIAGNOSIS — F17210 Nicotine dependence, cigarettes, uncomplicated: Secondary | ICD-10-CM | POA: Diagnosis not present

## 2018-01-14 DIAGNOSIS — N939 Abnormal uterine and vaginal bleeding, unspecified: Secondary | ICD-10-CM | POA: Diagnosis present

## 2018-01-14 LAB — CBC
HCT: 36.9 % (ref 36.0–46.0)
Hemoglobin: 12.4 g/dL (ref 12.0–15.0)
MCH: 27.7 pg (ref 26.0–34.0)
MCHC: 33.6 g/dL (ref 30.0–36.0)
MCV: 82.4 fL (ref 78.0–100.0)
Platelets: 365 10*3/uL (ref 150–400)
RBC: 4.48 MIL/uL (ref 3.87–5.11)
RDW: 13 % (ref 11.5–15.5)
WBC: 7.8 10*3/uL (ref 4.0–10.5)

## 2018-01-14 LAB — WET PREP, GENITAL
Clue Cells Wet Prep HPF POC: NONE SEEN
Sperm: NONE SEEN
Trich, Wet Prep: NONE SEEN
Yeast Wet Prep HPF POC: NONE SEEN

## 2018-01-14 LAB — POCT PREGNANCY, URINE: PREG TEST UR: NEGATIVE

## 2018-01-14 MED ORDER — MEGESTROL ACETATE 40 MG PO TABS: 40.0000 mg | ORAL_TABLET | Freq: Two times a day (BID) | ORAL | 0 refills | Status: DC

## 2018-01-14 NOTE — MAU Provider Note (Signed)
Chief Complaint: Vaginal Bleeding   First Provider Initiated Contact with Patient 01/14/18 1057      SUBJECTIVE HPI: Amy Yoder is a 32 y.o. [phone] not currently pregnant who presents to maternity admissions reporting abdominal cramping and vaginal bleeding for 15 days. She reports starting her cycle then it stopped for 3 days and restarting again. She reports the abdominal pain as cramping, rates pain 4/10- has not taken any medication for it today, does not want any medication. She has a history of irregular bleeding but reports it has not affected her in "a while". She reports not being seen or having a PAP in 7 years due to not having an insurance. She was worried that something was wrong and wanted to make sure bleeding "was coming from the right hole".  She denies vaginal itching/burning, urinary symptoms, h/a, dizziness, n/v, or fever/chills.    Past Medical History:  Diagnosis Date  . Bipolar I disorder ([place])   . Bronchitis   . Depression   . Marijuana use   . Tobacco use    Past Surgical History:  Procedure Laterality Date  . CHOLECYSTECTOMY N/A 03/12/2014   Procedure: LAPAROSCOPIC CHOLECYSTECTOMY WITH INTRAOPERATIVE CHOLANGIOGRAM;  Surgeon: Merrie Roof, MD;  Location: [facility];  Service: General;  Laterality: N/A;  . DENTAL SURGERY    . DILATION AND CURETTAGE OF UTERUS     Social History   Socioeconomic History  . Marital status: Single    Spouse name: Not on file  . Number of children: Not on file  . Years of education: Not on file  . Highest education level: Not on file  Social Needs  . Financial resource strain: Not on file  . Food insecurity - worry: Not on file  . Food insecurity - inability: Not on file  . Transportation needs - medical: Not on file  . Transportation needs - non-medical: Not on file  Occupational History  . Not on file  Tobacco Use  . Smoking status: Current Every Day Smoker    Packs/day: 0.50    Types: Cigarettes  . Smokeless  tobacco: Never Used  Substance and Sexual Activity  . Alcohol use: Yes    Comment: 2x/week  . Drug use: Yes    Comment: occasional Marijuana use  . Sexual activity: Yes    Birth control/protection: Condom  Other Topics Concern  . Not on file  Social History Narrative  . Not on file   No current facility-administered medications on file prior to encounter.    Current Outpatient Medications on File Prior to Encounter  Medication Sig Dispense Refill  . acetaminophen (TYLENOL) 500 MG tablet Take 500-1,000 mg by mouth every 6 (six) hours as needed for moderate pain or fever.    . benzonatate (TESSALON) 100 MG capsule Take 2 capsules (200 mg total) by mouth 2 (two) times daily as needed for cough. 20 capsule 0  . clindamycin (CLEOCIN) 150 MG capsule Take 1 capsule (150 mg total) by mouth every 6 (six) hours. 28 capsule 0  . famotidine (PEPCID) 20 MG tablet Take 1 tablet (20 mg total) by mouth 2 (two) times daily. (Patient not taking: Reported on 12/26/2016) 60 tablet 0  . gabapentin (NEURONTIN) 300 MG capsule Take 900 mg by mouth every morning.     Marland Kitchen HYDROcodone-acetaminophen (NORCO/VICODIN) 5-325 MG tablet Take 2 tablets by mouth every 4 (four) hours as needed. 10 tablet 0  . omeprazole (PRILOSEC) 20 MG capsule Take 1 capsule (20 mg total)  by mouth daily. (Patient taking differently: Take 20 mg by mouth daily as needed (heartburn). ) 30 capsule 0  . ondansetron (ZOFRAN) 4 MG tablet Take 1 tablet (4 mg total) by mouth every 6 (six) hours. 12 tablet 0  . QUEtiapine (SEROQUEL) 300 MG tablet Take 300 mg by mouth at bedtime.    . sertraline (ZOLOFT) 100 MG tablet Take 200 mg by mouth daily.     Allergies  Allergen Reactions  . Penicillins Hives, Shortness Of Breath and Swelling    Boils  Has patient had a PCN reaction causing immediate rash, facial/tongue/throat swelling, SOB or lightheadedness with hypotension: yes Has patient had a PCN reaction causing severe rash involving mucus membranes or  skin necrosis: no Has patient had a PCN reaction that required hospitalization: yes, visit Has patient had a PCN reaction occurring within the last 10 years: no If all of the above answers are "NO", then may proceed with Cephalosporin use.     ROS:  Review of Systems  Constitutional: Negative.   Respiratory: Negative.   Cardiovascular: Negative.   Gastrointestinal: Positive for abdominal pain. Negative for constipation, diarrhea, nausea and vomiting.  Genitourinary: Positive for vaginal bleeding. Negative for difficulty urinating, dysuria, frequency, pelvic pain, urgency and vaginal pain.  Musculoskeletal: Negative.   Skin: Negative.   Neurological: Negative.   Psychiatric/Behavioral: Negative.    I have reviewed patient's Past Medical Hx, Surgical Hx, Family Hx, Social Hx, medications and allergies.   Physical Exam   Patient Vitals for the past 24 hrs:  BP Temp Temp src Pulse Resp SpO2 Height Weight  01/14/18 1039 123/69 98.4 F (36.9 C) Oral 76 17 98 % 5\' 3"  (1.6 m) 219 lb 1.9 oz (99.4 kg)   Constitutional: Well-developed, obese female in no acute distress.  Cardiovascular: normal rate Respiratory: normal effort GI: Abd soft, non-tender. Pos BS x 4 MS: Extremities nontender, no edema, normal ROM Neurologic: Alert and oriented x 4.  GU: Neg CVAT.  PELVIC EXAM: Cervix pink, visually closed, without lesion, moderate dark red bleeding from cervix and present on vaginal walls, external genitalia normal Bimanual exam: Cervix 0/long/high, firm, anterior, neg CMT, uterus nontender, nonenlarged, adnexa without tenderness, enlargement, or mass   LAB RESULTS Results for orders placed or performed during the hospital encounter of 01/14/18 (from the past 24 hour(s))  Pregnancy, urine POC     Status: None   Collection Time: 01/14/18 10:47 AM  Result Value Ref Range   Preg Test, Ur NEGATIVE NEGATIVE  CBC     Status: None   Collection Time: 01/14/18 10:54 AM  Result Value Ref Range    WBC 7.8 4.0 - 10.5 K/uL   RBC 4.48 3.87 - 5.11 MIL/uL   Hemoglobin 12.4 12.0 - 15.0 g/dL   HCT 36.9 36.0 - 46.0 %   MCV 82.4 78.0 - 100.0 fL   MCH 27.7 26.0 - 34.0 pg   MCHC 33.6 30.0 - 36.0 g/dL   RDW 13.0 11.5 - 15.5 %   Platelets 365 150 - 400 K/uL  Wet prep, genital     Status: Abnormal   Collection Time: 01/14/18 11:15 AM  Result Value Ref Range   Yeast Wet Prep HPF POC NONE SEEN NONE SEEN   Trich, Wet Prep NONE SEEN NONE SEEN   Clue Cells Wet Prep HPF POC NONE SEEN NONE SEEN   WBC, Wet Prep HPF POC FEW (A) NONE SEEN   Sperm NONE SEEN     MAU Management/MDM: Orders Placed This  Encounter  Procedures  . Wet prep, genital  . CBC  . Pregnancy, urine POC  Wet prep- negative  CBC- normal, hemoglobin normal- no blood or iron supplements needed with excessive bleeding   Meds ordered this encounter  Medications  . megestrol (MEGACE) 40 MG tablet    Sig: Take 1 tablet (40 mg total) by mouth 2 (two) times daily. Can increase to two tablets twice a day in the event of heavy bleeding    Dispense:  28 tablet    Refill:  0    Order Specific Question:   Supervising Provider    Answer:   CONSTANT, [place]   Pt discharged with instructions to be seen in the clinic in two weeks for GYN and AUB. Discussed outpatient [place] with patient- patient declines due to being self pay, "just want medication to stop bleeding and be seen for care".   ASSESSMENT 1. DUB (dysfunctional uterine bleeding)   2. Lower abdominal pain     PLAN Discharge home Rx for Megace  Call to make appointment for GYN care  Return to urgent care, PCP, or [facility] with worsening symptoms    Allergies as of 01/14/2018      Reactions   Penicillins Hives, Shortness Of Breath, Swelling   Boils  Has patient had a PCN reaction causing immediate rash, facial/tongue/throat swelling, SOB or lightheadedness with hypotension: yes Has patient had a PCN reaction causing severe rash involving mucus membranes or skin  necrosis: no Has patient had a PCN reaction that required hospitalization: yes, visit Has patient had a PCN reaction occurring within the last 10 years: no If all of the above answers are "NO", then may proceed with Cephalosporin use.      Medication List    TAKE these medications   acetaminophen 500 MG tablet Commonly known as:  TYLENOL Take 500-1,000 mg by mouth every 6 (six) hours as needed for moderate pain or fever.   benzonatate 100 MG capsule Commonly known as:  TESSALON Take 2 capsules (200 mg total) by mouth 2 (two) times daily as needed for cough.   clindamycin 150 MG capsule Commonly known as:  CLEOCIN Take 1 capsule (150 mg total) by mouth every 6 (six) hours.   famotidine 20 MG tablet Commonly known as:  PEPCID Take 1 tablet (20 mg total) by mouth 2 (two) times daily.   gabapentin 300 MG capsule Commonly known as:  NEURONTIN Take 900 mg by mouth every morning.   HYDROcodone-acetaminophen 5-325 MG tablet Commonly known as:  NORCO/VICODIN Take 2 tablets by mouth every 4 (four) hours as needed.   megestrol 40 MG tablet Commonly known as:  MEGACE Take 1 tablet (40 mg total) by mouth 2 (two) times daily. Can increase to two tablets twice a day in the event of heavy bleeding   omeprazole 20 MG capsule Commonly known as:  PRILOSEC Take 1 capsule (20 mg total) by mouth daily. What changed:    when to take this  reasons to take this   ondansetron 4 MG tablet Commonly known as:  ZOFRAN Take 1 tablet (4 mg total) by mouth every 6 (six) hours.   QUEtiapine 300 MG tablet Commonly known as:  SEROQUEL Take 300 mg by mouth at bedtime.   sertraline 100 MG tablet Commonly known as:  ZOLOFT Take 200 mg by mouth daily.       Darrol Poke  Certified Nurse-Midwife 01/14/2018  11:17 AM

## 2018-01-14 NOTE — MAU Note (Signed)
Pt is c/o VB for 16 days.  Thinks period started 1 weeks early and has been bleeding without stopping.  Pt reports soaking a pad in 1.5 hours.  Pt reports that bleeding is increased in the last week and is now passing golf ball sized clots.  Pt reports taking antibiotics the day she started bleeing for a tooth abscess.

## 2018-01-14 NOTE — MAU Note (Signed)
Started cycle 15 days ago. Stopped for 2 days and then restarted. Patient reports "gushing" out clots the size of a golf ball. Reports soaking a pad every 1-2 hours.   +lower abdominal pain Pressure in nature Constant Radiates to back Rating pain 4/10 Last took pain medication day before yesterday

## 2018-01-14 NOTE — Discharge Instructions (Signed)

## 2018-01-15 LAB — GC/CHLAMYDIA PROBE AMP (~~LOC~~) NOT AT ARMC
Chlamydia: NEGATIVE
Neisseria Gonorrhea: NEGATIVE

## 2018-07-10 ENCOUNTER — Encounter (HOSPITAL_COMMUNITY): Payer: Self-pay | Admitting: Emergency Medicine

## 2018-07-10 ENCOUNTER — Ambulatory Visit (HOSPITAL_COMMUNITY)
Admission: EM | Admit: 2018-07-10 | Discharge: 2018-07-10 | Disposition: A | Discharge status: Home / Self Care | Payer: BLUE CROSS/BLUE SHIELD | Attending: Internal Medicine | Admitting: Internal Medicine

## 2018-07-10 DIAGNOSIS — Z79818 Long term (current) use of other agents affecting estrogen receptors and estrogen levels: Secondary | ICD-10-CM | POA: Diagnosis not present

## 2018-07-10 DIAGNOSIS — Z8249 Family history of ischemic heart disease and other diseases of the circulatory system: Secondary | ICD-10-CM | POA: Diagnosis not present

## 2018-07-10 DIAGNOSIS — F319 Bipolar disorder, unspecified: Secondary | ICD-10-CM | POA: Diagnosis not present

## 2018-07-10 DIAGNOSIS — J029 Acute pharyngitis, unspecified: Secondary | ICD-10-CM | POA: Diagnosis not present

## 2018-07-10 DIAGNOSIS — Z79891 Long term (current) use of opiate analgesic: Secondary | ICD-10-CM | POA: Insufficient documentation

## 2018-07-10 DIAGNOSIS — J019 Acute sinusitis, unspecified: Secondary | ICD-10-CM | POA: Diagnosis not present

## 2018-07-10 DIAGNOSIS — Z9889 Other specified postprocedural states: Secondary | ICD-10-CM | POA: Insufficient documentation

## 2018-07-10 DIAGNOSIS — Z792 Long term (current) use of antibiotics: Secondary | ICD-10-CM | POA: Diagnosis not present

## 2018-07-10 DIAGNOSIS — Z79899 Other long term (current) drug therapy: Secondary | ICD-10-CM | POA: Insufficient documentation

## 2018-07-10 DIAGNOSIS — Z9049 Acquired absence of other specified parts of digestive tract: Secondary | ICD-10-CM | POA: Diagnosis not present

## 2018-07-10 DIAGNOSIS — Z880 Allergy status to penicillin: Secondary | ICD-10-CM | POA: Insufficient documentation

## 2018-07-10 DIAGNOSIS — F17210 Nicotine dependence, cigarettes, uncomplicated: Secondary | ICD-10-CM | POA: Insufficient documentation

## 2018-07-10 DIAGNOSIS — J209 Acute bronchitis, unspecified: Secondary | ICD-10-CM

## 2018-07-10 DIAGNOSIS — H9209 Otalgia, unspecified ear: Secondary | ICD-10-CM | POA: Insufficient documentation

## 2018-07-10 LAB — POCT URINALYSIS DIP (DEVICE)
BILIRUBIN URINE: NEGATIVE
Glucose, UA: NEGATIVE mg/dL
Ketones, ur: NEGATIVE mg/dL
Leukocytes, UA: NEGATIVE
NITRITE: NEGATIVE
PH: 7 (ref 5.0–8.0)
Protein, ur: 30 mg/dL — AB
Specific Gravity, Urine: 1.02 (ref 1.005–1.030)
Urobilinogen, UA: 0.2 mg/dL (ref 0.0–1.0)

## 2018-07-10 MED ORDER — AZITHROMYCIN 250 MG PO TABS: 250.0000 mg | ORAL_TABLET | Freq: Every day | ORAL | 0 refills | Status: DC

## 2018-07-10 MED ORDER — ALBUTEROL SULFATE HFA 108 (90 BASE) MCG/ACT IN AERS: 1.0000 | INHALATION_SPRAY | RESPIRATORY_TRACT | 0 refills | Status: DC | PRN

## 2018-07-10 NOTE — ED Provider Notes (Signed)
[name]    CSN: [id number] Arrival date & time: 07/10/18  1511     History   Chief Complaint Chief Complaint  Patient presents with  . Nasal Congestion  . Sore Throat    HPI Amy Yoder is a 32 y.o. female.   She presents today with 2-week history of cough, runny/congested nose, and the last few days has taken a turn for the worse.  Now with tactile temp/chills, malaise.  Bad sore throat, earache.  33-year-old nephew had similar symptoms recently.    HPI  Past Medical History:  Diagnosis Date  . Bipolar I disorder ([place])   . Bronchitis   . Depression   . Marijuana use   . Tobacco use     Patient Active Problem List   Diagnosis Date Noted  . Symptomatic cholelithiasis s/p lap chole 03/12/2014 03/11/2014    Past Surgical History:  Procedure Laterality Date  . CHOLECYSTECTOMY N/A 03/12/2014   Procedure: LAPAROSCOPIC CHOLECYSTECTOMY WITH INTRAOPERATIVE CHOLANGIOGRAM;  Surgeon: Merrie Roof, MD;  Location: [facility];  Service: General;  Laterality: N/A;  . DENTAL SURGERY    . DILATION AND CURETTAGE OF UTERUS      OB History    Gravida  4   Para  1   Term      Preterm      AB  3   Living  1     SAB  1   TAB  2   Ectopic      Multiple      Live Births               Home Medications    Prior to Admission medications   Medication Sig Start Date End Date Taking? Authorizing Provider  gabapentin (NEURONTIN) 300 MG capsule Take 900 mg by mouth every morning.    Yes [provider]  QUEtiapine (SEROQUEL) 300 MG tablet Take 300 mg by mouth at bedtime.   Yes [provider]  sertraline (ZOLOFT) 100 MG tablet Take 200 mg by mouth daily.   Yes [provider]  acetaminophen (TYLENOL) 500 MG tablet Take 500-1,000 mg by mouth every 6 (six) hours as needed for moderate pain or fever.    [provider]  albuterol (PROVENTIL HFA;VENTOLIN HFA) 108 (90 Base) MCG/ACT inhaler Inhale 1-2 puffs into  the lungs every 4 (four) hours as needed for wheezing or shortness of breath. 07/10/18   [name], MD  azithromycin (ZITHROMAX) 250 MG tablet Take 1 tablet (250 mg total) by mouth daily. Take first 2 tablets together, then 1 every day until finished. 07/10/18   [name], MD  benzonatate (TESSALON) 100 MG capsule Take 2 capsules (200 mg total) by mouth 2 (two) times daily as needed for cough. Patient not taking: Reported on 01/14/2018 12/26/16   [name], PA-C  famotidine (PEPCID) 20 MG tablet Take 1 tablet (20 mg total) by mouth 2 (two) times daily. Patient not taking: Reported on 12/26/2016 10/26/13   Clayton Bibles, PA-C  HYDROcodone-acetaminophen (NORCO/VICODIN) 5-325 MG tablet Take 2 tablets by mouth every 4 (four) hours as needed. 01/03/18   [name], PA-C  megestrol (MEGACE) 40 MG tablet Take 1 tablet (40 mg total) by mouth 2 (two) times daily. Can increase to two tablets twice a day in the event of heavy bleeding 01/14/18   [name], CNM  omeprazole (PRILOSEC) 20 MG capsule Take 1 capsule (20 mg total) by mouth daily.  Patient taking differently: Take 20 mg by mouth daily as needed (heartburn).  10/26/13   Clayton Bibles, PA-C  ondansetron (ZOFRAN) 4 MG tablet Take 1 tablet (4 mg total) by mouth every 6 (six) hours. Patient not taking: Reported on 01/14/2018 12/26/16   [name], PA-C    Family History Family History  Problem Relation Age of Onset  . Hypertension Mother     Social History Social History   Tobacco Use  . Smoking status: Current Every Day Smoker    Packs/day: 0.50    Types: Cigarettes  . Smokeless tobacco: Never Used  Substance Use Topics  . Alcohol use: Yes    Comment: 2x/week  . Drug use: Yes    Comment: occasional Marijuana use     Allergies   Penicillins   Review of Systems Review of Systems  All other systems reviewed and are negative.    Physical Exam Triage Vital Signs ED Triage Vitals  Enc Vitals  Group     BP 07/10/18 1557 (!) 137/91     Pulse Rate 07/10/18 1557 98     Resp 07/10/18 1557 16     Temp 07/10/18 1557 99.9 F (37.7 C)     Temp Source 07/10/18 1557 Oral     SpO2 07/10/18 1557 97 %     Weight 07/10/18 1558 213 lb (96.6 kg)     Height --      Pain Score 07/10/18 1559 10     Pain Loc --    Updated Vital Signs BP (!) 137/91   Pulse 98   Temp 99.9 F (37.7 C) (Oral)   Resp 16   Wt 213 lb (96.6 kg)   LMP 07/10/2018   SpO2 97%   BMI 37.73 kg/m  Physical Exam  Constitutional: She is oriented to person, place, and time. No distress.  Voice sounds quite congested  HENT:  Head: Atraumatic.  Bilateral TMs are moderately dull, red-tinged Marked nasal congestion bilaterally with mucopurulent material present, erythema in the left upper/lateral nare Bilateral tonsils are slightly prominent, erythematous, no exudate  Eyes:  Conjugate gaze observed, no eye redness/discharge  Neck: Neck supple.  Cardiovascular: Normal rate and regular rhythm.  Pulmonary/Chest: No respiratory distress. She has no wheezes. She has no rales.  Coarse but symmetric breath sounds throughout.  No frank wheezing  Abdominal: She exhibits no distension.  Musculoskeletal: Normal range of motion.  Neurological: She is alert and oriented to person, place, and time.  Skin: Skin is warm and dry.  Nursing note and vitals reviewed.    UC Treatments / Results  Labs  Results for orders placed or performed during the hospital encounter of 07/10/18  Urine culture  Result Value Ref Range   Specimen Description URINE, RANDOM    Special Requests NONE    Culture      NO GROWTH Performed at [facility], [address], [place], [place] [address]    Report Status 07/11/2018 FINAL   POCT urinalysis dip (device)  Result Value Ref Range   Glucose, UA NEGATIVE NEGATIVE mg/dL   Bilirubin Urine NEGATIVE NEGATIVE   Ketones, ur NEGATIVE NEGATIVE mg/dL   Specific Gravity, Urine 1.020 1.005 - 1.030    Hgb urine dipstick MODERATE (A) NEGATIVE   pH 7.0 5.0 - 8.0   Protein, ur 30 (A) NEGATIVE mg/dL   Urobilinogen, UA 0.2 0.0 - 1.0 mg/dL   Nitrite NEGATIVE NEGATIVE   Leukocytes, UA NEGATIVE NEGATIVE     Final Clinical  Impressions(s) / UC Diagnoses   Final diagnoses:  Acute sinusitis with symptoms > 10 days  Acute bronchitis with bronchospasm     Discharge Instructions     Anticipate gradual improvement in well-being, cough, congestion, over the next several days.  Cough may take a couple weeks to subside.  Prescriptions for Zithromax (antibiotic) and albuterol inhaler (for cough/wheezing) were sent to the pharmacy.  Push fluids and rest.  Note for work today and tomorrow.  Recheck for persistent (>3-4 more days) fever >100.5, increasing phlegm production/nasal discharge, or if not starting to improve in a few days.      ED Prescriptions    Medication Sig Dispense Auth. Provider   azithromycin (ZITHROMAX) 250 MG tablet Take 1 tablet (250 mg total) by mouth daily. Take first 2 tablets together, then 1 every day until finished. 6 tablet [name], MD   albuterol (PROVENTIL HFA;VENTOLIN HFA) 108 (90 Base) MCG/ACT inhaler Inhale 1-2 puffs into the lungs every 4 (four) hours as needed for wheezing or shortness of breath. 1 Inhaler [name], MD        [name], MD 07/14/18 2105

## 2018-07-10 NOTE — Discharge Instructions (Addendum)
Anticipate gradual improvement in well-being, cough, congestion, over the next several days.  Cough may take a couple weeks to subside.  Prescriptions for Zithromax (antibiotic) and albuterol inhaler (for cough/wheezing) were sent to the pharmacy.  Push fluids and rest.  Note for work today and tomorrow.  Recheck for persistent (>3-4 more days) fever >100.5, increasing phlegm production/nasal discharge, or if not starting to improve in a few days.  Urine has some blood in it today, a urine culture is pending to check for infection.  The urgent care will contact you if a change in treatment is needed based on culture results.

## 2018-07-10 NOTE — ED Triage Notes (Signed)
PT reports symptoms for 2 weeks. Cough, congestion, body aches, sore throat.

## 2018-07-11 LAB — URINE CULTURE: CULTURE: NO GROWTH

## 2018-08-09 ENCOUNTER — Emergency Department (HOSPITAL_COMMUNITY)
Admission: EM | Admit: 2018-08-09 | Discharge: 2018-08-09 | Disposition: A | Discharge status: Home / Self Care | Payer: BLUE CROSS/BLUE SHIELD | Attending: Emergency Medicine | Admitting: Emergency Medicine

## 2018-08-09 ENCOUNTER — Encounter (HOSPITAL_COMMUNITY): Payer: Self-pay | Admitting: Emergency Medicine

## 2018-08-09 ENCOUNTER — Emergency Department (HOSPITAL_COMMUNITY): Payer: BLUE CROSS/BLUE SHIELD

## 2018-08-09 DIAGNOSIS — F17210 Nicotine dependence, cigarettes, uncomplicated: Secondary | ICD-10-CM | POA: Diagnosis not present

## 2018-08-09 DIAGNOSIS — R1010 Upper abdominal pain, unspecified: Secondary | ICD-10-CM | POA: Diagnosis not present

## 2018-08-09 DIAGNOSIS — Z79899 Other long term (current) drug therapy: Secondary | ICD-10-CM | POA: Diagnosis not present

## 2018-08-09 DIAGNOSIS — R109 Unspecified abdominal pain: Secondary | ICD-10-CM | POA: Diagnosis not present

## 2018-08-09 DIAGNOSIS — R51 Headache: Secondary | ICD-10-CM | POA: Insufficient documentation

## 2018-08-09 DIAGNOSIS — R519 Headache, unspecified: Secondary | ICD-10-CM

## 2018-08-09 LAB — COMPREHENSIVE METABOLIC PANEL
ALK PHOS: 74 U/L (ref 38–126)
ALT: 18 U/L (ref 0–44)
AST: 19 U/L (ref 15–41)
Albumin: 3.7 g/dL (ref 3.5–5.0)
Anion gap: 9 (ref 5–15)
BILIRUBIN TOTAL: 0.5 mg/dL (ref 0.3–1.2)
BUN: 13 mg/dL (ref 6–20)
CALCIUM: 9.1 mg/dL (ref 8.9–10.3)
CO2: 22 mmol/L (ref 22–32)
CREATININE: 0.65 mg/dL (ref 0.44–1.00)
Chloride: 107 mmol/L (ref 98–111)
GFR calc non Af Amer: >60 mL/min (ref >60)
Glucose, Bld: 94 mg/dL (ref 70–99)
Potassium: 3.8 mmol/L (ref 3.5–5.1)
SODIUM: 138 mmol/L (ref 135–145)
TOTAL PROTEIN: 6.8 g/dL (ref 6.5–8.1)

## 2018-08-09 LAB — CBC WITH DIFFERENTIAL/PLATELET
Basophils Absolute: 0 10*3/uL (ref 0.0–0.1)
Basophils Relative: 0 %
Eosinophils Absolute: 0.2 10*3/uL (ref 0.0–0.7)
Eosinophils Relative: 2 %
HEMATOCRIT: 37.7 % (ref 36.0–46.0)
HEMOGLOBIN: 12.3 g/dL (ref 12.0–15.0)
LYMPHS PCT: 37 %
Lymphs Abs: 3.2 10*3/uL (ref 0.7–4.0)
MCH: 26.9 pg (ref 26.0–34.0)
MCHC: 32.6 g/dL (ref 30.0–36.0)
MCV: 82.5 fL (ref 78.0–100.0)
MONOS PCT: 13 %
Monocytes Absolute: 1.1 10*3/uL — ABNORMAL HIGH (ref 0.1–1.0)
NEUTROS ABS: 4.1 10*3/uL (ref 1.7–7.7)
Neutrophils Relative %: 48 %
Platelets: 359 10*3/uL (ref 150–400)
RBC: 4.57 MIL/uL (ref 3.87–5.11)
RDW: 13.6 % (ref 11.5–15.5)
WBC: 8.5 10*3/uL (ref 4.0–10.5)

## 2018-08-09 LAB — I-STAT BETA HCG BLOOD, ED (MC, WL, AP ONLY): I-stat hCG, quantitative: <5 m[IU]/mL (ref <5)

## 2018-08-09 LAB — URINALYSIS, ROUTINE W REFLEX MICROSCOPIC
Bilirubin Urine: NEGATIVE
GLUCOSE, UA: NEGATIVE mg/dL
HGB URINE DIPSTICK: NEGATIVE
Ketones, ur: NEGATIVE mg/dL
Nitrite: NEGATIVE
PH: 5.5 (ref 5.0–8.0)
Protein, ur: NEGATIVE mg/dL
Specific Gravity, Urine: >1.03 — ABNORMAL HIGH (ref 1.005–1.030)

## 2018-08-09 LAB — URINALYSIS, MICROSCOPIC (REFLEX)

## 2018-08-09 LAB — LIPASE, BLOOD: Lipase: 33 U/L (ref 11–51)

## 2018-08-09 MED ORDER — ONDANSETRON HCL 4 MG PO TABS: 4.0000 mg | ORAL_TABLET | Freq: Four times a day (QID) | ORAL | 0 refills | Status: DC

## 2018-08-09 MED ORDER — SODIUM CHLORIDE 0.9 % IV BOLUS
1000.0000 mL | Freq: Once | INTRAVENOUS | Status: DC
Start: 2018-08-09 — End: 2018-08-09

## 2018-08-09 MED ORDER — DIPHENHYDRAMINE HCL 50 MG/ML IJ SOLN
25.0000 mg | Freq: Once | INTRAMUSCULAR | Status: AC
  Administered 2018-08-09: 25 mg via INTRAVENOUS
  Filled 2018-08-09: qty 1

## 2018-08-09 MED ORDER — SODIUM CHLORIDE 0.9 % IV BOLUS
1000.0000 mL | Freq: Once | INTRAVENOUS | Status: AC
Start: 2018-08-09 — End: 2018-08-09
  Administered 2018-08-09: 1000 mL via INTRAVENOUS

## 2018-08-09 MED ORDER — MORPHINE SULFATE (PF) 4 MG/ML IV SOLN: 4.0000 mg | Freq: Once | INTRAVENOUS | Status: DC

## 2018-08-09 MED ORDER — METOCLOPRAMIDE HCL 5 MG/ML IJ SOLN
10.0000 mg | Freq: Once | INTRAMUSCULAR | Status: AC
  Administered 2018-08-09: 10 mg via INTRAVENOUS
  Filled 2018-08-09: qty 2

## 2018-08-09 MED ORDER — DICLOFENAC SODIUM 50 MG PO TBEC: 50.0000 mg | DELAYED_RELEASE_TABLET | Freq: Two times a day (BID) | ORAL | 0 refills | Status: DC

## 2018-08-09 MED ORDER — ONDANSETRON HCL 4 MG/2ML IJ SOLN: 4.0000 mg | Freq: Once | INTRAMUSCULAR | Status: DC

## 2018-08-09 MED ORDER — KETOROLAC TROMETHAMINE 30 MG/ML IJ SOLN
30.0000 mg | Freq: Once | INTRAMUSCULAR | Status: AC
  Administered 2018-08-09: 30 mg via INTRAVENOUS
  Filled 2018-08-09: qty 1

## 2018-08-09 NOTE — Discharge Instructions (Addendum)
CT head, blood work, and urinalysis were all normal.  Increase fluids.  Prescription for pain and nausea medicine.

## 2018-08-09 NOTE — ED Triage Notes (Signed)
Patient here from home with complaints of bilateral lower flank pain "for weeks" and headache x3 months. OTC medications with no relief. Nausea, vomiting.

## 2018-08-09 NOTE — ED Notes (Signed)
Patient transported to CT 

## 2018-08-10 NOTE — ED Provider Notes (Signed)
[facility] DEPT Provider Note   CSN: [id number] Arrival date & time: 08/09/18  0825     History   Chief Complaint Chief Complaint  Patient presents with  . Back Pain  . Headache    HPI Amy Yoder is a 31 y.o. female.  Patient reports headache for 2 months starting in the occipital area and traveling to the crown of her head.  Headache is intermittent with no aura.  No fever, sweats, chills, neuro deficits, stiff neck, visual changes, facial asymmetry.  Additionally she states bilateral flank pain and minimal anterior abdominal pain.  No weight loss, vomiting, diarrhea, dysuria.  Severity of symptoms is mild to moderate.  Nothing makes symptoms better or worse.     Past Medical History:  Diagnosis Date  . Bipolar I disorder ([place])   . Bronchitis   . Depression   . Marijuana use   . Tobacco use     Patient Active Problem List   Diagnosis Date Noted  . Symptomatic cholelithiasis s/p lap chole 03/12/2014 03/11/2014    Past Surgical History:  Procedure Laterality Date  . CHOLECYSTECTOMY N/A 03/12/2014   Procedure: LAPAROSCOPIC CHOLECYSTECTOMY WITH INTRAOPERATIVE CHOLANGIOGRAM;  Surgeon: Merrie Roof, MD;  Location: [facility];  Service: General;  Laterality: N/A;  . DENTAL SURGERY    . DILATION AND CURETTAGE OF UTERUS       OB History    Gravida  4   Para  1   Term      Preterm      AB  3   Living  1     SAB  1   TAB  2   Ectopic      Multiple      Live Births               Home Medications    Prior to Admission medications   Medication Sig Start Date End Date Taking? Authorizing Provider  albuterol (PROVENTIL HFA;VENTOLIN HFA) 108 (90 Base) MCG/ACT inhaler Inhale 1-2 puffs into the lungs every 4 (four) hours as needed for wheezing or shortness of breath. 07/10/18  Yes [name], MD  gabapentin (NEURONTIN) 300 MG capsule Take 900 mg by mouth every morning.    Yes [provider]    omeprazole (PRILOSEC) 20 MG capsule Take 1 capsule (20 mg total) by mouth daily. Patient taking differently: Take 20 mg by mouth daily as needed (heartburn).  10/26/13  Yes West, Emily, PA-C  QUEtiapine (SEROQUEL) 400 MG tablet Take 400 mg by mouth daily. 07/09/18  Yes [provider]  sertraline (ZOLOFT) 100 MG tablet Take 200 mg by mouth daily.   Yes [provider]  azithromycin (ZITHROMAX) 250 MG tablet Take 1 tablet (250 mg total) by mouth daily. Take first 2 tablets together, then 1 every day until finished. Patient not taking: Reported on 08/09/2018 07/10/18   [name], MD  diclofenac (VOLTAREN) 50 MG EC tablet Take 1 tablet (50 mg total) by mouth 2 (two) times daily. 08/09/18   Nat Christen, MD  ondansetron (ZOFRAN) 4 MG tablet Take 1 tablet (4 mg total) by mouth every 6 (six) hours. 08/09/18   Nat Christen, MD    Family History Family History  Problem Relation Age of Onset  . Hypertension Mother     Social History Social History   Tobacco Use  . Smoking status: Current Every Day Smoker    Packs/day: 0.50    Types: Cigarettes  .  Smokeless tobacco: Never Used  Substance Use Topics  . Alcohol use: Yes    Comment: 2x/week  . Drug use: Yes    Comment: occasional Marijuana use     Allergies   Penicillins   Review of Systems Review of Systems  All other systems reviewed and are negative.    Physical Exam Updated Vital Signs BP (!) 168/93 (BP Location: Left Arm)   Pulse 69   Temp 98.6 F (37 C) (Oral)   Resp 15   LMP 07/10/2018   SpO2 100%   Physical Exam  Constitutional: She is oriented to person, place, and time. She appears well-developed and well-nourished.  Elevated bmi  HENT:  Head: Normocephalic and atraumatic.  Eyes: Conjunctivae are normal.  Neck: Neck supple.  Cardiovascular: Normal rate and regular rhythm.  Pulmonary/Chest: Effort normal and breath sounds normal.  Abdominal: Soft. Bowel sounds are normal.   Musculoskeletal: Normal range of motion.  Neurological: She is alert and oriented to person, place, and time.  Skin: Skin is warm and dry.  Psychiatric: She has a normal mood and affect. Her behavior is normal.  Nursing note and vitals reviewed. Genitourinary: Minimal bilateral flank tenderness   ED Treatments / Results  Labs (all labs ordered are listed, but only abnormal results are displayed) Labs Reviewed  URINALYSIS, ROUTINE W REFLEX MICROSCOPIC - Abnormal; Notable for the following components:      Result Value   Specific Gravity, Urine >1.030 (*)    Leukocytes, UA TRACE (*)    All other components within normal limits  CBC WITH DIFFERENTIAL/PLATELET - Abnormal; Notable for the following components:   Monocytes Absolute 1.1 (*)    All other components within normal limits  URINALYSIS, MICROSCOPIC (REFLEX) - Abnormal; Notable for the following components:   Bacteria, UA FEW (*)    All other components within normal limits  COMPREHENSIVE METABOLIC PANEL  LIPASE, BLOOD  I-STAT BETA HCG BLOOD, ED ([facility], [facility], [facility] ONLY)    EKG None  Radiology Ct Head Wo Contrast  Result Date: 08/09/2018 CLINICAL DATA:  Headache for 2 months. Normal neurologic examination. No reported injury. EXAM: CT HEAD WITHOUT CONTRAST TECHNIQUE: Contiguous axial images were obtained from the base of the skull through the vertex without intravenous contrast. COMPARISON:  None. FINDINGS: Brain: No evidence of parenchymal hemorrhage or extra-axial fluid collection. No mass lesion, mass effect, or midline shift. No CT evidence of acute infarction. Cerebral volume is age appropriate. No ventriculomegaly. Vascular: No acute abnormality. Skull: No evidence of calvarial fracture. Sinuses/Orbits: The visualized paranasal sinuses are essentially clear. Other:  The mastoid air cells are unopacified. IMPRESSION: Negative head CT.  No evidence of acute intracranial abnormality. Electronically Signed   By: Ilona Sorrel M.D.    On: 08/09/2018 10:36    Procedures Procedures (including critical care time)  Medications Ordered in ED Medications  sodium chloride 0.9 % bolus 1,000 mL (0 mLs Intravenous Stopped 08/09/18 1200)  ketorolac (TORADOL) 30 MG/ML injection 30 mg (30 mg Intravenous Given 08/09/18 0956)  metoCLOPramide (REGLAN) injection 10 mg (10 mg Intravenous Given 08/09/18 0956)  diphenhydrAMINE (BENADRYL) injection 25 mg (25 mg Intravenous Given 08/09/18 0956)     Initial Impression / Assessment and Plan / ED Course  I have reviewed the triage vital signs and the nursing notes.  Pertinent labs & imaging results that were available during my care of the patient were reviewed by me and considered in my medical decision making (see chart for details).  Patient presents with persistent headache for 2 months.  She is nontoxic-appearing.  Work-up including CT head negative.  She felt much better after IV fluids, IV Toradol, IV Reglan, IV Benadryl.  Discharge medication Voltaren 50 mg Zofran 4 mg.  Final Clinical Impressions(s) / ED Diagnoses   Final diagnoses:  Intractable headache, unspecified chronicity pattern, unspecified headache type    ED Discharge Orders         Ordered    diclofenac (VOLTAREN) 50 MG EC tablet  2 times daily     08/09/18 1205    ondansetron (ZOFRAN) 4 MG tablet  Every 6 hours     08/09/18 1205           Nat Christen, MD 08/10/18 [phone]

## 2019-02-02 ENCOUNTER — Emergency Department (HOSPITAL_COMMUNITY)
Admission: EM | Admit: 2019-02-02 | Discharge: 2019-02-02 | Disposition: A | Discharge status: Home / Self Care | Payer: Medicaid Other | Attending: Emergency Medicine | Admitting: Emergency Medicine

## 2019-02-02 ENCOUNTER — Encounter (HOSPITAL_COMMUNITY): Payer: Self-pay

## 2019-02-02 ENCOUNTER — Emergency Department (HOSPITAL_COMMUNITY): Payer: Medicaid Other

## 2019-02-02 DIAGNOSIS — K625 Hemorrhage of anus and rectum: Secondary | ICD-10-CM | POA: Diagnosis not present

## 2019-02-02 DIAGNOSIS — F17210 Nicotine dependence, cigarettes, uncomplicated: Secondary | ICD-10-CM | POA: Insufficient documentation

## 2019-02-02 DIAGNOSIS — R1030 Lower abdominal pain, unspecified: Secondary | ICD-10-CM | POA: Diagnosis not present

## 2019-02-02 DIAGNOSIS — Z79899 Other long term (current) drug therapy: Secondary | ICD-10-CM | POA: Diagnosis not present

## 2019-02-02 LAB — DIFFERENTIAL
Abs Immature Granulocytes: 0.04 10*3/uL (ref 0.00–0.07)
BASOS ABS: 0 10*3/uL (ref 0.0–0.1)
BASOS PCT: 0 %
EOS ABS: 0.2 10*3/uL (ref 0.0–0.5)
EOS PCT: 2 %
Immature Granulocytes: 0 %
Lymphocytes Relative: 29 %
Lymphs Abs: 2.9 10*3/uL (ref 0.7–4.0)
MONO ABS: 0.9 10*3/uL (ref 0.1–1.0)
Monocytes Relative: 10 %
NEUTROS PCT: 59 %
Neutro Abs: 5.7 10*3/uL (ref 1.7–7.7)

## 2019-02-02 LAB — COMPREHENSIVE METABOLIC PANEL
ALT: 16 U/L (ref 0–44)
AST: 18 U/L (ref 15–41)
Albumin: 3.6 g/dL (ref 3.5–5.0)
Alkaline Phosphatase: 86 U/L (ref 38–126)
Anion gap: 9 (ref 5–15)
BUN: 9 mg/dL (ref 6–20)
CALCIUM: 8.5 mg/dL — AB (ref 8.9–10.3)
CHLORIDE: 106 mmol/L (ref 98–111)
CO2: 23 mmol/L (ref 22–32)
Creatinine, Ser: 0.74 mg/dL (ref 0.44–1.00)
GFR calc non Af Amer: >60 mL/min (ref >60)
Glucose, Bld: 104 mg/dL — ABNORMAL HIGH (ref 70–99)
Potassium: 3.4 mmol/L — ABNORMAL LOW (ref 3.5–5.1)
SODIUM: 138 mmol/L (ref 135–145)
Total Bilirubin: 0.2 mg/dL — ABNORMAL LOW (ref 0.3–1.2)
Total Protein: 7.1 g/dL (ref 6.5–8.1)

## 2019-02-02 LAB — URINALYSIS, ROUTINE W REFLEX MICROSCOPIC
Bilirubin Urine: NEGATIVE
GLUCOSE, UA: NEGATIVE mg/dL
Hgb urine dipstick: NEGATIVE
Ketones, ur: NEGATIVE mg/dL
Nitrite: NEGATIVE
PROTEIN: NEGATIVE mg/dL
SPECIFIC GRAVITY, URINE: 1.018 (ref 1.005–1.030)
WBC, UA: >50 WBC/hpf — ABNORMAL HIGH (ref 0–5)
pH: 6 (ref 5.0–8.0)

## 2019-02-02 LAB — CBC
HEMATOCRIT: 37.4 % (ref 36.0–46.0)
Hemoglobin: 11.7 g/dL — ABNORMAL LOW (ref 12.0–15.0)
MCH: 27.1 pg (ref 26.0–34.0)
MCHC: 31.3 g/dL (ref 30.0–36.0)
MCV: 86.6 fL (ref 80.0–100.0)
PLATELETS: 331 10*3/uL (ref 150–400)
RBC: 4.32 MIL/uL (ref 3.87–5.11)
RDW: 12.7 % (ref 11.5–15.5)
WBC: 9.7 10*3/uL (ref 4.0–10.5)
nRBC: 0 % (ref 0.0–0.2)

## 2019-02-02 LAB — LIPASE, BLOOD: Lipase: 34 U/L (ref 11–51)

## 2019-02-02 LAB — I-STAT BETA HCG BLOOD, ED (MC, WL, AP ONLY)

## 2019-02-02 MED ORDER — CIPROFLOXACIN HCL 500 MG PO TABS: 500.0000 mg | ORAL_TABLET | Freq: Two times a day (BID) | ORAL | 0 refills | Status: DC

## 2019-02-02 MED ORDER — IOPAMIDOL (ISOVUE-300) INJECTION 61%
INTRAVENOUS | Status: AC
  Filled 2019-02-02: qty 100

## 2019-02-02 MED ORDER — OXYCODONE-ACETAMINOPHEN 5-325 MG PO TABS
1.0000 | ORAL_TABLET | Freq: Once | ORAL | Status: AC
  Administered 2019-02-02: 1 via ORAL
  Filled 2019-02-02: qty 1

## 2019-02-02 MED ORDER — OXYCODONE-ACETAMINOPHEN 5-325 MG PO TABS: 1.0000 | ORAL_TABLET | Freq: Four times a day (QID) | ORAL | 0 refills | Status: DC | PRN

## 2019-02-02 MED ORDER — SODIUM CHLORIDE 0.9% FLUSH: 3.0000 mL | Freq: Once | INTRAVENOUS | Status: DC

## 2019-02-02 MED ORDER — ONDANSETRON HCL 4 MG/2ML IJ SOLN
4.0000 mg | Freq: Once | INTRAMUSCULAR | Status: AC
  Administered 2019-02-02: 4 mg via INTRAVENOUS
  Filled 2019-02-02: qty 2

## 2019-02-02 MED ORDER — SODIUM CHLORIDE 0.9 % IV BOLUS
1000.0000 mL | Freq: Once | INTRAVENOUS | Status: AC
  Administered 2019-02-02: 1000 mL via INTRAVENOUS

## 2019-02-02 MED ORDER — METRONIDAZOLE 500 MG PO TABS: 500.0000 mg | ORAL_TABLET | Freq: Three times a day (TID) | ORAL | 0 refills | Status: DC

## 2019-02-02 MED ORDER — PROMETHAZINE HCL 25 MG PO TABS: 25.0000 mg | ORAL_TABLET | Freq: Four times a day (QID) | ORAL | 0 refills | Status: DC | PRN

## 2019-02-02 MED ORDER — IOPAMIDOL (ISOVUE-300) INJECTION 61%
100.0000 mL | Freq: Once | INTRAVENOUS | Status: AC | PRN
  Administered 2019-02-02: 100 mL via INTRAVENOUS

## 2019-02-02 MED ORDER — SODIUM CHLORIDE (PF) 0.9 % IJ SOLN
INTRAMUSCULAR | Status: AC
  Filled 2019-02-02: qty 50

## 2019-02-02 MED ORDER — HYDROMORPHONE HCL 1 MG/ML IJ SOLN
1.0000 mg | Freq: Once | INTRAMUSCULAR | Status: AC
  Administered 2019-02-02: 1 mg via INTRAVENOUS
  Filled 2019-02-02: qty 1

## 2019-02-02 NOTE — ED Triage Notes (Signed)
Pt presents with c/o abdominal pain and rectal bleeding for approx one month. Pt also reports vomiting up blood. Pt reports she feels bloated and nauseated.

## 2019-02-02 NOTE — ED Provider Notes (Signed)
[facility] DEPT Provider Note   CSN: [id number] Arrival date & time: 02/02/19  6195     History   Chief Complaint Chief Complaint  Patient presents with  . Abdominal Pain  . Rectal Bleeding    HPI Amy Yoder is a 33 y.o. female.  Patient complains of lower abdominal pain and rectal bleeding for a month.  No fever no chills  The history is provided by the patient. No language interpreter was used.  Abdominal Pain  Pain location:  Generalized Pain quality: aching   Pain radiates to:  Does not radiate Pain severity:  Moderate Onset quality:  Sudden Timing:  Constant Progression:  Waxing and waning Chronicity:  New Context: not alcohol use   Relieved by:  Nothing Worsened by:  Nothing Ineffective treatments:  None tried Associated symptoms: hematochezia   Associated symptoms: no anorexia, no chest pain, no cough, no diarrhea, no fatigue and no hematuria   Rectal Bleeding  Associated symptoms: abdominal pain     Past Medical History:  Diagnosis Date  . Bipolar I disorder ([place])   . Bronchitis   . Depression   . Marijuana use   . Tobacco use     Patient Active Problem List   Diagnosis Date Noted  . Symptomatic cholelithiasis s/p lap chole 03/12/2014 03/11/2014    Past Surgical History:  Procedure Laterality Date  . CHOLECYSTECTOMY N/A 03/12/2014   Procedure: LAPAROSCOPIC CHOLECYSTECTOMY WITH INTRAOPERATIVE CHOLANGIOGRAM;  Surgeon: Merrie Roof, MD;  Location: [facility];  Service: General;  Laterality: N/A;  . DENTAL SURGERY    . DILATION AND CURETTAGE OF UTERUS       OB History    Gravida  4   Para  1   Term      Preterm      AB  3   Living  1     SAB  1   TAB  2   Ectopic      Multiple      Live Births               Home Medications    Prior to Admission medications   Medication Sig Start Date End Date Taking? Authorizing Provider  acetaminophen (TYLENOL) 325 MG tablet Take 650 mg by  mouth every 6 (six) hours as needed for mild pain or headache.   Yes [provider]  gabapentin (NEURONTIN) 300 MG capsule Take 900 mg by mouth every morning.    Yes [provider]  QUEtiapine (SEROQUEL) 400 MG tablet Take 400 mg by mouth daily. 07/09/18  Yes [provider]  sertraline (ZOLOFT) 100 MG tablet Take 200 mg by mouth daily.   Yes [provider]  albuterol (PROVENTIL HFA;VENTOLIN HFA) 108 (90 Base) MCG/ACT inhaler Inhale 1-2 puffs into the lungs every 4 (four) hours as needed for wheezing or shortness of breath. Patient not taking: Reported on 02/02/2019 07/10/18   Amy Luna, MD  azithromycin (ZITHROMAX) 250 MG tablet Take 1 tablet (250 mg total) by mouth daily. Take first 2 tablets together, then 1 every day until finished. Patient not taking: Reported on 08/09/2018 07/10/18   Amy Luna, MD  ciprofloxacin (CIPRO) 500 MG tablet Take 1 tablet (500 mg total) by mouth 2 (two) times daily. 02/02/19   Milton Ferguson, MD  diclofenac (VOLTAREN) 50 MG EC tablet Take 1 tablet (50 mg total) by mouth 2 (two) times daily. Patient not taking: Reported on 02/02/2019 08/09/18  Nat Christen, MD  metroNIDAZOLE (FLAGYL) 500 MG tablet Take 1 tablet (500 mg total) by mouth 3 (three) times daily. One po bid x 7 days 02/02/19   Milton Ferguson, MD  omeprazole (PRILOSEC) 20 MG capsule Take 1 capsule (20 mg total) by mouth daily. Patient not taking: Reported on 02/02/2019 10/26/13   Clayton Bibles, PA-C  ondansetron (ZOFRAN) 4 MG tablet Take 1 tablet (4 mg total) by mouth every 6 (six) hours. Patient not taking: Reported on 02/02/2019 08/09/18   Nat Christen, MD  oxyCODONE-acetaminophen (PERCOCET) 5-325 MG tablet Take 1 tablet by mouth every 6 (six) hours as needed. 02/02/19   Milton Ferguson, MD  promethazine (PHENERGAN) 25 MG tablet Take 1 tablet (25 mg total) by mouth every 6 (six) hours as needed for nausea or vomiting. 02/02/19   Milton Ferguson, MD    Family  History Family History  Problem Relation Age of Onset  . Hypertension Mother     Social History Social History   Tobacco Use  . Smoking status: Current Every Day Smoker    Packs/day: 0.50    Types: Cigarettes  . Smokeless tobacco: Never Used  Substance Use Topics  . Alcohol use: Yes    Comment: 2x/week  . Drug use: Yes    Comment: occasional Marijuana use     Allergies   Penicillins   Review of Systems Review of Systems  Constitutional: Negative for appetite change and fatigue.  HENT: Negative for congestion, ear discharge and sinus pressure.   Eyes: Negative for discharge.  Respiratory: Negative for cough.   Cardiovascular: Negative for chest pain.  Gastrointestinal: Positive for abdominal pain and hematochezia. Negative for anorexia and diarrhea.  Genitourinary: Negative for frequency and hematuria.  Musculoskeletal: Negative for back pain.  Skin: Negative for rash.  Neurological: Negative for seizures and headaches.  Psychiatric/Behavioral: Negative for hallucinations.     Physical Exam Updated Vital Signs BP 132/75   Pulse 88   Temp 98.2 F (36.8 C) (Oral)   Resp 16   Ht 5\' 2"  (1.575 m)   Wt 99.3 kg   LMP 01/26/2019 (Approximate)   SpO2 97%   BMI 40.06 kg/m   Physical Exam Vitals signs and nursing note reviewed.  Constitutional:      Appearance: She is well-developed.  HENT:     Head: Normocephalic.  Eyes:     General: No scleral icterus.    Conjunctiva/sclera: Conjunctivae normal.  Neck:     Musculoskeletal: Neck supple.     Thyroid: No thyromegaly.  Cardiovascular:     Rate and Rhythm: Normal rate and regular rhythm.     Heart sounds: No murmur. No friction rub. No gallop.   Pulmonary:     Breath sounds: No stridor. No wheezing or rales.  Chest:     Chest wall: No tenderness.  Abdominal:     General: There is no distension.     Tenderness: There is no abdominal tenderness. There is no rebound.  Musculoskeletal: Normal range of  motion.  Lymphadenopathy:     Cervical: No cervical adenopathy.  Skin:    Findings: No erythema or rash.  Neurological:     Mental Status: She is oriented to person, place, and time.     Motor: No abnormal muscle tone.     Coordination: Coordination normal.  Psychiatric:        Behavior: Behavior normal.      ED Treatments / Results  Labs (all labs ordered are listed, but only abnormal results  are displayed) Labs Reviewed  COMPREHENSIVE METABOLIC PANEL - Abnormal; Notable for the following components:      Result Value   Potassium 3.4 (*)    Glucose, Bld 104 (*)    Calcium 8.5 (*)    Total Bilirubin 0.2 (*)    All other components within normal limits  CBC - Abnormal; Notable for the following components:   Hemoglobin 11.7 (*)    All other components within normal limits  URINALYSIS, ROUTINE W REFLEX MICROSCOPIC - Abnormal; Notable for the following components:   APPearance CLOUDY (*)    Leukocytes,Ua LARGE (*)    WBC, UA >50 (*)    Bacteria, UA RARE (*)    All other components within normal limits  LIPASE, BLOOD  DIFFERENTIAL  I-STAT BETA HCG BLOOD, ED ([facility], [facility], [facility] ONLY)    EKG None  Radiology Ct Abdomen Pelvis W Contrast  Result Date: 02/02/2019 CLINICAL DATA:  33 year old female with blood in the stool EXAM: CT ABDOMEN AND PELVIS WITH CONTRAST TECHNIQUE: Multidetector CT imaging of the abdomen and pelvis was performed using the standard protocol following bolus administration of intravenous contrast. CONTRAST:  150mL ISOVUE-300 IOPAMIDOL (ISOVUE-300) INJECTION 61% COMPARISON:  None. FINDINGS: Lower chest: No acute abnormality. Hepatobiliary: Unremarkable liver.  Cholecystectomy. Pancreas: Unremarkable pancreas Spleen: Unremarkable spleen Adrenals/Urinary Tract: Unremarkable appearance of the adrenal glands. No evidence of hydronephrosis of the right or left kidney. No nephrolithiasis. Unremarkable course of the bilateral ureters. Unremarkable appearance of the urinary  bladder. Stomach/Bowel: Unremarkable stomach. Small bowel unremarkable with no focal wall thickening or transition point. No inflammatory changes of small bowel mesentery. No mesenteric adenopathy. Normal appendix.  Unremarkable TI. Diverticula are present. There are subtle inflammatory changes in the pelvis, including the sigmoid mesentery with questionable engorgement of the vasculature. Hazy stranding within the fat of the mesentery. There are multiple borderline enlarged lymph nodes. Largest on the sagittal reformatted images measures 15 mm. Sagittal view demonstrates loss of the fat planes in the rectovaginal fat plane. Vascular/Lymphatic: Borderline lymph nodes within the left greater than right pelvic sidewall extending along the IMA/IM V drainage pathway and the right iliac pathway. Unremarkable appearance of the arteries with no atherosclerotic changes. Reproductive: Unremarkable uterus. There appears to be physiologic changes of the bilateral ovaries. Other: No hernia. Musculoskeletal: No displaced fracture. IMPRESSION: Subtle inflammatory changes in the pelvis associated with the rectosigmoid junction. This includes edema, borderline lymph nodes, and questionable engorged vasculature. It is uncertain if these changes are reactive secondary to infection (including diverticulitis, proctitis), or if this could represent a primary abnormality such as inflammatory bowel disease. Correlation with patient's presentation/history recommended, as well as GI referral. Electronically Signed   By: Corrie Mckusick D.O.   On: 02/02/2019 13:21    Procedures Procedures (including critical care time)  Medications Ordered in ED Medications  sodium chloride flush (NS) 0.9 % injection 3 mL (0 mLs Intravenous Hold 02/02/19 0924)  iopamidol (ISOVUE-300) 61 % injection (has no administration in time range)  sodium chloride (PF) 0.9 % injection (has no administration in time range)  oxyCODONE-acetaminophen  (PERCOCET/ROXICET) 5-325 MG per tablet 1 tablet (has no administration in time range)  sodium chloride 0.9 % bolus 1,000 mL ( Intravenous Stopped 02/02/19 1034)  HYDROmorphone (DILAUDID) injection 1 mg (1 mg Intravenous Given 02/02/19 1230)  ondansetron (ZOFRAN) injection 4 mg (4 mg Intravenous Given 02/02/19 1230)  iopamidol (ISOVUE-300) 61 % injection 100 mL (100 mLs Intravenous Contrast Given 02/02/19 1256)     Initial Impression /  Assessment and Plan / ED Course  I have reviewed the triage vital signs and the nursing notes.  Pertinent labs & imaging results that were available during my care of the patient were reviewed by me and considered in my medical decision making (see chart for details).    Patient abdominal pain and rectal bleeding.  She is referred to GI and started on Cipro Flagyl  Final Clinical Impressions(s) / ED Diagnoses   Final diagnoses:  Lower abdominal pain    ED Discharge Orders         Ordered    ciprofloxacin (CIPRO) 500 MG tablet  2 times daily     02/02/19 1346    metroNIDAZOLE (FLAGYL) 500 MG tablet  3 times daily     02/02/19 1346    oxyCODONE-acetaminophen (PERCOCET) 5-325 MG tablet  Every 6 hours PRN     02/02/19 1346    promethazine (PHENERGAN) 25 MG tablet  Every 6 hours PRN     02/02/19 1346           Milton Ferguson, MD 02/02/19 1354

## 2019-02-02 NOTE — Discharge Instructions (Addendum)
Follow-up with [facility] in 1 week

## 2019-02-03 LAB — URINE CULTURE: Culture: NO GROWTH

## 2019-02-14 ENCOUNTER — Inpatient Hospital Stay (HOSPITAL_COMMUNITY)
Admission: EM | Admit: 2019-02-14 | Discharge: 2019-02-16 | DRG: 758 | Disposition: A | Discharge status: Home / Self Care | Payer: Medicaid Other | Attending: Obstetrics and Gynecology | Admitting: Obstetrics and Gynecology

## 2019-02-14 ENCOUNTER — Emergency Department (HOSPITAL_COMMUNITY): Payer: Medicaid Other

## 2019-02-14 ENCOUNTER — Encounter (HOSPITAL_COMMUNITY): Payer: Self-pay

## 2019-02-14 ENCOUNTER — Other Ambulatory Visit: Payer: Self-pay

## 2019-02-14 DIAGNOSIS — N739 Female pelvic inflammatory disease, unspecified: Secondary | ICD-10-CM

## 2019-02-14 DIAGNOSIS — F1290 Cannabis use, unspecified, uncomplicated: Secondary | ICD-10-CM | POA: Diagnosis present

## 2019-02-14 DIAGNOSIS — Z792 Long term (current) use of antibiotics: Secondary | ICD-10-CM

## 2019-02-14 DIAGNOSIS — K625 Hemorrhage of anus and rectum: Secondary | ICD-10-CM | POA: Diagnosis present

## 2019-02-14 DIAGNOSIS — A749 Chlamydial infection, unspecified: Secondary | ICD-10-CM | POA: Insufficient documentation

## 2019-02-14 DIAGNOSIS — Z79899 Other long term (current) drug therapy: Secondary | ICD-10-CM

## 2019-02-14 DIAGNOSIS — N730 Acute parametritis and pelvic cellulitis: Principal | ICD-10-CM | POA: Diagnosis present

## 2019-02-14 DIAGNOSIS — Z8249 Family history of ischemic heart disease and other diseases of the circulatory system: Secondary | ICD-10-CM

## 2019-02-14 DIAGNOSIS — F17210 Nicotine dependence, cigarettes, uncomplicated: Secondary | ICD-10-CM | POA: Diagnosis present

## 2019-02-14 DIAGNOSIS — I10 Essential (primary) hypertension: Secondary | ICD-10-CM | POA: Diagnosis present

## 2019-02-14 DIAGNOSIS — Z6839 Body mass index (BMI) 39.0-39.9, adult: Secondary | ICD-10-CM

## 2019-02-14 DIAGNOSIS — N7093 Salpingitis and oophoritis, unspecified: Secondary | ICD-10-CM | POA: Diagnosis present

## 2019-02-14 DIAGNOSIS — Z79891 Long term (current) use of opiate analgesic: Secondary | ICD-10-CM

## 2019-02-14 DIAGNOSIS — R350 Frequency of micturition: Secondary | ICD-10-CM | POA: Diagnosis present

## 2019-02-14 DIAGNOSIS — R300 Dysuria: Secondary | ICD-10-CM | POA: Diagnosis present

## 2019-02-14 DIAGNOSIS — E669 Obesity, unspecified: Secondary | ICD-10-CM

## 2019-02-14 DIAGNOSIS — F319 Bipolar disorder, unspecified: Secondary | ICD-10-CM | POA: Diagnosis present

## 2019-02-14 HISTORY — DX: Calculus of gallbladder without cholecystitis without obstruction: K80.20

## 2019-02-14 HISTORY — DX: Essential (primary) hypertension: I10

## 2019-02-14 LAB — CBC
HCT: 37.8 % (ref 36.0–46.0)
Hemoglobin: 12 g/dL (ref 12.0–15.0)
MCH: 27 pg (ref 26.0–34.0)
MCHC: 31.7 g/dL (ref 30.0–36.0)
MCV: 85.1 fL (ref 80.0–100.0)
Platelets: 347 10*3/uL (ref 150–400)
RBC: 4.44 MIL/uL (ref 3.87–5.11)
RDW: 13 % (ref 11.5–15.5)
WBC: 12.7 10*3/uL — AB (ref 4.0–10.5)
nRBC: 0 % (ref 0.0–0.2)

## 2019-02-14 LAB — URINALYSIS, ROUTINE W REFLEX MICROSCOPIC
BILIRUBIN URINE: NEGATIVE
Bacteria, UA: NONE SEEN
Glucose, UA: NEGATIVE mg/dL
Hgb urine dipstick: NEGATIVE
Ketones, ur: 20 mg/dL — AB
Nitrite: NEGATIVE
Protein, ur: NEGATIVE mg/dL
Specific Gravity, Urine: 1.026 (ref 1.005–1.030)
pH: 5 (ref 5.0–8.0)

## 2019-02-14 LAB — WET PREP, GENITAL
Clue Cells Wet Prep HPF POC: NONE SEEN
SPERM: NONE SEEN
Trich, Wet Prep: NONE SEEN
Yeast Wet Prep HPF POC: NONE SEEN

## 2019-02-14 LAB — COMPREHENSIVE METABOLIC PANEL
ALT: 20 U/L (ref 0–44)
AST: 20 U/L (ref 15–41)
Albumin: 4.6 g/dL (ref 3.5–5.0)
Alkaline Phosphatase: 79 U/L (ref 38–126)
Anion gap: 8 (ref 5–15)
BUN: 9 mg/dL (ref 6–20)
CO2: 22 mmol/L (ref 22–32)
Calcium: 9.1 mg/dL (ref 8.9–10.3)
Chloride: 108 mmol/L (ref 98–111)
Creatinine, Ser: 0.75 mg/dL (ref 0.44–1.00)
GFR calc Af Amer: >60 mL/min (ref >60)
GFR calc non Af Amer: >60 mL/min (ref >60)
Glucose, Bld: 110 mg/dL — ABNORMAL HIGH (ref 70–99)
POTASSIUM: 3.2 mmol/L — AB (ref 3.5–5.1)
Sodium: 138 mmol/L (ref 135–145)
Total Bilirubin: 0.7 mg/dL (ref 0.3–1.2)
Total Protein: 8.6 g/dL — ABNORMAL HIGH (ref 6.5–8.1)

## 2019-02-14 LAB — TYPE AND SCREEN
ABO/RH(D): O POS
Antibody Screen: NEGATIVE

## 2019-02-14 LAB — POC OCCULT BLOOD, ED: Fecal Occult Bld: NEGATIVE

## 2019-02-14 LAB — I-STAT BETA HCG BLOOD, ED (MC, WL, AP ONLY): I-stat hCG, quantitative: <5 m[IU]/mL (ref <5)

## 2019-02-14 LAB — ABO/RH: ABO/RH(D): O POS

## 2019-02-14 MED ORDER — SODIUM CHLORIDE (PF) 0.9 % IJ SOLN
INTRAMUSCULAR | Status: AC
  Administered 2019-02-14: 22:00:00
  Filled 2019-02-14: qty 50

## 2019-02-14 MED ORDER — SODIUM CHLORIDE 0.9 % IV BOLUS
1000.0000 mL | Freq: Once | INTRAVENOUS | Status: AC
  Administered 2019-02-14: 1000 mL via INTRAVENOUS

## 2019-02-14 MED ORDER — POTASSIUM CHLORIDE CRYS ER 20 MEQ PO TBCR
40.0000 meq | EXTENDED_RELEASE_TABLET | Freq: Once | ORAL | Status: AC
  Administered 2019-02-14: 40 meq via ORAL
  Filled 2019-02-14: qty 2

## 2019-02-14 MED ORDER — HYDROMORPHONE HCL 1 MG/ML IJ SOLN
1.0000 mg | Freq: Once | INTRAMUSCULAR | Status: AC
  Administered 2019-02-14: 1 mg via INTRAVENOUS
  Filled 2019-02-14: qty 1

## 2019-02-14 MED ORDER — SODIUM CHLORIDE 0.9 % IV SOLN
1.0000 g | Freq: Once | INTRAVENOUS | Status: AC
  Administered 2019-02-14: 1 g via INTRAVENOUS
  Filled 2019-02-14: qty 10

## 2019-02-14 MED ORDER — ONDANSETRON HCL 4 MG/2ML IJ SOLN
4.0000 mg | Freq: Once | INTRAMUSCULAR | Status: AC
  Administered 2019-02-14: 4 mg via INTRAVENOUS
  Filled 2019-02-14: qty 2

## 2019-02-14 MED ORDER — IOPAMIDOL (ISOVUE-300) INJECTION 61%
INTRAVENOUS | Status: AC
  Administered 2019-02-14: 100 mL via INTRAVENOUS
  Filled 2019-02-14: qty 100

## 2019-02-14 MED ORDER — IOPAMIDOL (ISOVUE-300) INJECTION 61%
100.0000 mL | Freq: Once | INTRAVENOUS | Status: AC | PRN
  Administered 2019-02-14: 100 mL via INTRAVENOUS

## 2019-02-14 MED ORDER — AZITHROMYCIN 250 MG PO TABS
1000.0000 mg | ORAL_TABLET | Freq: Once | ORAL | Status: AC
  Administered 2019-02-14: 1000 mg via ORAL
  Filled 2019-02-14: qty 4

## 2019-02-14 MED ORDER — MORPHINE SULFATE (PF) 4 MG/ML IV SOLN
6.0000 mg | Freq: Once | INTRAVENOUS | Status: AC
  Administered 2019-02-14: 6 mg via INTRAVENOUS
  Filled 2019-02-14: qty 2

## 2019-02-14 MED ORDER — KETOROLAC TROMETHAMINE 30 MG/ML IJ SOLN
30.0000 mg | Freq: Once | INTRAMUSCULAR | Status: AC
  Administered 2019-02-14: 30 mg via INTRAVENOUS
  Filled 2019-02-14: qty 1

## 2019-02-14 NOTE — ED Notes (Signed)
US in process at this time.  

## 2019-02-14 NOTE — ED Notes (Signed)
Pt states she is in very bad pain,unable to give urine at this time.Requesting to see the nurse for pain meds.

## 2019-02-14 NOTE — ED Provider Notes (Signed)
[facility] DEPT Provider Note   CSN: [id number] Arrival date & time: 02/14/19  1740    History   Chief Complaint Chief Complaint  Patient presents with  . Abdominal Pain  . Rectal Bleeding  . Dysuria  . Urinary Frequency    HPI Amy Yoder is a 33 y.o. female.     The history is provided by the patient and medical records. No language interpreter was used.  Abdominal Pain  Associated symptoms: dysuria and hematochezia   Rectal Bleeding  Associated symptoms: abdominal pain   Dysuria  Associated symptoms: abdominal pain   Urinary Frequency  Associated symptoms include abdominal pain.     33 year old female with history of bipolar, marijuana use, prior cholecystectomy presenting for evaluation of abdominal pain.  Patient report for the past several weeks she has had recurrent abdominal pain.  Pain is described as a sharp sensation, rated across abdomen, with associated nausea, and blood in her stool.  Pain is moderate in severity, waxing waning.  She endorses chills.  She endorses urinary discomfort.  She denies fever, chest pain shortness of breath or productive cough.  She denies any vaginal bleeding or vaginal discharge.  Patient was seen on 2/17 in the ED for her complaint.  A CT scan of the abdomen and pelvis was obtained showing subtle inflammatory changes in the pelvis associated with the rectosigmoid junction.  It was thought that this could be diverticulitis or proctitis however it could also be inflammatory bowel disease.  Patient did receive antibiotic including Cipro and Flagyl.  States she has been taking the medication as prescribed for the full duration but states that symptoms still persist and is getting progressively worse.  She noticed blood per rectum, bright red, intermittent, worsening when she strained and also endorsed rectal itchiness and rectal pain.  Past Medical History:  Diagnosis Date  . Bipolar I disorder  ([place])   . Bronchitis   . Depression   . Marijuana use   . Tobacco use     Patient Active Problem List   Diagnosis Date Noted  . Symptomatic cholelithiasis s/p lap chole 03/12/2014 03/11/2014    Past Surgical History:  Procedure Laterality Date  . CHOLECYSTECTOMY N/A 03/12/2014   Procedure: LAPAROSCOPIC CHOLECYSTECTOMY WITH INTRAOPERATIVE CHOLANGIOGRAM;  Surgeon: Merrie Roof, MD;  Location: [facility];  Service: General;  Laterality: N/A;  . DENTAL SURGERY    . DILATION AND CURETTAGE OF UTERUS       OB History    Gravida  4   Para  1   Term      Preterm      AB  3   Living  1     SAB  1   TAB  2   Ectopic      Multiple      Live Births               Home Medications    Prior to Admission medications   Medication Sig Start Date End Date Taking? Authorizing Provider  acetaminophen (TYLENOL) 325 MG tablet Take 650 mg by mouth every 6 (six) hours as needed for mild pain or headache.    [provider]  albuterol (PROVENTIL HFA;VENTOLIN HFA) 108 (90 Base) MCG/ACT inhaler Inhale 1-2 puffs into the lungs every 4 (four) hours as needed for wheezing or shortness of breath. Patient not taking: Reported on 02/02/2019 07/10/18   [name], MD  azithromycin (ZITHROMAX) 250 MG tablet Take  1 tablet (250 mg total) by mouth daily. Take first 2 tablets together, then 1 every day until finished. Patient not taking: Reported on 08/09/2018 07/10/18   [name], MD  ciprofloxacin (CIPRO) 500 MG tablet Take 1 tablet (500 mg total) by mouth 2 (two) times daily. 02/02/19   Milton Ferguson, MD  diclofenac (VOLTAREN) 50 MG EC tablet Take 1 tablet (50 mg total) by mouth 2 (two) times daily. Patient not taking: Reported on 02/02/2019 08/09/18   Nat Christen, MD  gabapentin (NEURONTIN) 300 MG capsule Take 900 mg by mouth every morning.     [provider]  metroNIDAZOLE (FLAGYL) 500 MG tablet Take 1 tablet (500 mg total) by mouth 3 (three) times daily. One  po bid x 7 days 02/02/19   Milton Ferguson, MD  omeprazole (PRILOSEC) 20 MG capsule Take 1 capsule (20 mg total) by mouth daily. Patient not taking: Reported on 02/02/2019 10/26/13   Clayton Bibles, PA-C  ondansetron (ZOFRAN) 4 MG tablet Take 1 tablet (4 mg total) by mouth every 6 (six) hours. Patient not taking: Reported on 02/02/2019 08/09/18   Nat Christen, MD  oxyCODONE-acetaminophen (PERCOCET) 5-325 MG tablet Take 1 tablet by mouth every 6 (six) hours as needed. 02/02/19   Milton Ferguson, MD  promethazine (PHENERGAN) 25 MG tablet Take 1 tablet (25 mg total) by mouth every 6 (six) hours as needed for nausea or vomiting. 02/02/19   Milton Ferguson, MD  QUEtiapine (SEROQUEL) 400 MG tablet Take 400 mg by mouth daily. 07/09/18   [provider]  sertraline (ZOLOFT) 100 MG tablet Take 200 mg by mouth daily.    [provider]    Family History Family History  Problem Relation Age of Onset  . Hypertension Mother     Social History Social History   Tobacco Use  . Smoking status: Current Every Day Smoker    Packs/day: 0.50    Types: Cigarettes  . Smokeless tobacco: Never Used  Substance Use Topics  . Alcohol use: Yes    Comment: 2x/week  . Drug use: Yes    Comment: occasional Marijuana use     Allergies   Penicillins   Review of Systems Review of Systems  Gastrointestinal: Positive for abdominal pain and hematochezia.  Genitourinary: Positive for dysuria and frequency.  All other systems reviewed and are negative.    Physical Exam Updated Vital Signs BP (!) 175/110 (BP Location: Left Arm)   Pulse 97   Temp 99 F (37.2 C) (Oral)   Resp 16   Ht 5\' 2"  (1.575 m)   Wt 97.1 kg   LMP 02/14/2019   SpO2 100%   BMI 39.14 kg/m   Physical Exam Vitals signs and nursing note reviewed.  Constitutional:      General: She is not in acute distress.    Appearance: She is well-developed. She is obese.  HENT:     Head: Atraumatic.  Eyes:     Conjunctiva/sclera:  Conjunctivae normal.  Neck:     Musculoskeletal: Neck supple.  Cardiovascular:     Rate and Rhythm: Normal rate and regular rhythm.  Pulmonary:     Effort: Pulmonary effort is normal.     Breath sounds: Normal breath sounds.  Abdominal:     Palpations: Abdomen is soft.     Tenderness: There is generalized abdominal tenderness. There is no guarding or rebound.  Genitourinary:    Comments: Chaperone present during exam.  No inguinal lymphadenopathy or inguinal hernia noted.  Normal external  genitalia.  A fresh tampon was removed from vaginal vault.  Small amount of blood noted in vaginal vault.  Moderate discomfort with speculum insertion.  Closed cervical os without lesion or rash.  On bimanual examination, bilateral adnexal tenderness with cervical motion tenderness. Skin:    Findings: No rash.  Neurological:     Mental Status: She is alert.      ED Treatments / Results  Labs (all labs ordered are listed, but only abnormal results are displayed) Labs Reviewed  COMPREHENSIVE METABOLIC PANEL - Abnormal; Notable for the following components:      Result Value   Potassium 3.2 (*)    Glucose, Bld 110 (*)    Total Protein 8.6 (*)    All other components within normal limits  CBC - Abnormal; Notable for the following components:   WBC 12.7 (*)    All other components within normal limits  URINALYSIS, ROUTINE W REFLEX MICROSCOPIC - Abnormal; Notable for the following components:   Ketones, ur 20 (*)    Leukocytes,Ua TRACE (*)    All other components within normal limits  WET PREP, GENITAL  RPR  HIV ANTIBODY (ROUTINE TESTING W REFLEX)  POC OCCULT BLOOD, ED  I-STAT BETA HCG BLOOD, ED ([facility], [facility], [facility] ONLY)  TYPE AND SCREEN  ABO/RH  GC/CHLAMYDIA PROBE AMP ([place]) NOT AT [facility]    EKG None  Radiology Ct Abdomen Pelvis W Contrast  Result Date: 02/14/2019 CLINICAL DATA:  Severe abdominal pain Nausea EXAM: CT ABDOMEN AND PELVIS WITH CONTRAST TECHNIQUE: Multidetector CT imaging  of the abdomen and pelvis was performed using the standard protocol following bolus administration of intravenous contrast. CONTRAST:  125mL ISOVUE-300 IOPAMIDOL (ISOVUE-300) INJECTION 61% COMPARISON:  CT of the abdomen and pelvis on 02/02/2019 FINDINGS: Lower chest: No acute abnormality. Hepatobiliary: Cholecystectomy. The liver is otherwise unremarkable. Pancreas: Unremarkable. No pancreatic ductal dilatation or surrounding inflammatory changes. Spleen: Normal in size without focal abnormality. Adrenals/Urinary Tract: Adrenal glands are unremarkable. Kidneys are normal, without renal calculi, focal lesion, or hydronephrosis. Bladder is unremarkable. Stomach/Bowel: The stomach and small bowel loops are normal in appearance. Inflammatory changes in the pelvis, adjacent to the rectosigmoid colon. Otherwise the colon is normal in appearance. The appendix is normal thickness but extends into the pelvis adjacent to inflammatory changes. Vascular/Lymphatic: No significant vascular findings are present. Small bilateral external iliac lymph nodes. Small sigmoid mesentery lymph nodes. Reproductive: The uterus is present. There are numerous rim enhancing fluid collections within the adnexal regions bilaterally suspicious for pelvic inflammatory disease and tubo-ovarian abscesses. These range in size from 2.1-4.3 centimeters. Alternatively the findings could be related to ovarian lesions. Further evaluation pelvic ultrasound is recommended. A tampon is identified within the UPPER vaginal vault. Other: Anterior abdominal wall is unremarkable. Suspect small free pelvic fluid. Musculoskeletal: No acute or significant osseous findings. IMPRESSION: 1. Numerous rim enhancing fluid collections in the adnexal regions bilaterally, suspicious for pelvic inflammatory disease and tubo-ovarian abscesses. Alternatively the findings could be related to ovarian lesions. Further evaluation with pelvic ultrasound is recommended. 2. The  rectosigmoid colon is in close proximity to the inflammatory process and could also be the etiology of these changes but is felt to be less likely. 3. Cholecystectomy. 4. Normal appendix. Electronically Signed   By: Nolon Nations M.D.   On: 02/14/2019 23:00    Procedures Procedures (including critical care time)  Medications Ordered in ED Medications  HYDROmorphone (DILAUDID) injection 1 mg (has no administration in time range)  cefTRIAXone (ROCEPHIN) 1  g in sodium chloride 0.9 % 100 mL IVPB (has no administration in time range)  azithromycin (ZITHROMAX) tablet 1,000 mg (has no administration in time range)  morphine 4 MG/ML injection 6 mg (6 mg Intravenous Given 02/14/19 1944)  ondansetron (ZOFRAN) injection 4 mg (4 mg Intravenous Given 02/14/19 1944)  sodium chloride 0.9 % bolus 1,000 mL (0 mLs Intravenous Stopped 02/14/19 2139)  potassium chloride SA (K-DUR,KLOR-CON) CR tablet 40 mEq (40 mEq Oral Given 02/14/19 2114)  HYDROmorphone (DILAUDID) injection 1 mg (1 mg Intravenous Given 02/14/19 2134)  iopamidol (ISOVUE-300) 61 % injection 100 mL (100 mLs Intravenous Contrast Given 02/14/19 2212)  sodium chloride (PF) 0.9 % injection (  Given 02/14/19 2212)     Initial Impression / Assessment and Plan / ED Course  I have reviewed the triage vital signs and the nursing notes.  Pertinent labs & imaging results that were available during my care of the patient were reviewed by me and considered in my medical decision making (see chart for details).        BP (!) 148/79   Pulse 86   Temp 98.9 F (37.2 C) (Oral)   Resp 16   Ht 5\' 2"  (1.575 m)   Wt 97.1 kg   LMP 02/14/2019   SpO2 99%   BMI 39.14 kg/m    Final Clinical Impressions(s) / ED Diagnoses   Final diagnoses:  PID (acute pelvic inflammatory disease)  TOA (tubo-ovarian abscess)    ED Discharge Orders    None     7:23 PM Patient with abdominal pain, CT abdomen pelvis that was performed 2 weeks ago showed signs  concerning either colitis or potential inflammatory bowel disease. SHe still having quite a bit of pain despite finishing a full course of cipro/flagyl.  Work up initiated, will repeat CT scan  11:23 PM White count of 12.7, normal H&H, lites are reassuring, pregnancy test negative, urine without signs of urinary tract infection abdominal pelvis CT scan demonstrate numerous rim-enhancing fluid adnexal region suspicious for joint disease and tubo-ovarian abscess.  Finding could also be related to ovarian lesions. THe rectosigmoid colon is in close proximity to the inflammatory process and could also be the etiology of these changes but felt to be less likely.  Normal appendix.   Pt continue to endorse pain throughout the ER stay, additional pain medications given.  Pelvic examination performed and pt has bilateral adnexal tenderness along with CMT.  She is currently on her menstruation, a fresh tampon was removed from vaginal vault.  DOubt retain tampon.    11:35 PM Pt will receive pelvic/transvaginal US to further confirmed diagnosis.  Likely go home with doxycycline as treatment of TOA.  Does not meet SIRS criteria.  Pt sign out to Wallis Bamberg, PA-C who will f/u on [place] result and reassess.   Domenic Moras, PA-C 02/14/19 2337    Lacretia Leigh, MD 02/15/19 [phone]

## 2019-02-14 NOTE — ED Triage Notes (Signed)
Patient c/o abdominal pain and bloating x 1 week. Patient states she has been having intermittent bright red rectal bleeding with clots x 1 months.  Patient states she has urinary frequency and dysuria x 4 days. Patient states she was seen in the ED on 02/02/19 and states, "I had a colon infection."

## 2019-02-15 ENCOUNTER — Other Ambulatory Visit: Payer: Self-pay | Admitting: Obstetrics and Gynecology

## 2019-02-15 ENCOUNTER — Encounter (HOSPITAL_COMMUNITY): Payer: Self-pay | Admitting: Obstetrics and Gynecology

## 2019-02-15 DIAGNOSIS — F17210 Nicotine dependence, cigarettes, uncomplicated: Secondary | ICD-10-CM | POA: Diagnosis present

## 2019-02-15 DIAGNOSIS — N7093 Salpingitis and oophoritis, unspecified: Secondary | ICD-10-CM | POA: Diagnosis present

## 2019-02-15 DIAGNOSIS — N730 Acute parametritis and pelvic cellulitis: Secondary | ICD-10-CM | POA: Diagnosis not present

## 2019-02-15 DIAGNOSIS — K625 Hemorrhage of anus and rectum: Secondary | ICD-10-CM | POA: Diagnosis present

## 2019-02-15 DIAGNOSIS — I10 Essential (primary) hypertension: Secondary | ICD-10-CM

## 2019-02-15 DIAGNOSIS — Z8249 Family history of ischemic heart disease and other diseases of the circulatory system: Secondary | ICD-10-CM | POA: Diagnosis not present

## 2019-02-15 DIAGNOSIS — Z79891 Long term (current) use of opiate analgesic: Secondary | ICD-10-CM | POA: Diagnosis not present

## 2019-02-15 DIAGNOSIS — R350 Frequency of micturition: Secondary | ICD-10-CM | POA: Diagnosis present

## 2019-02-15 DIAGNOSIS — E669 Obesity, unspecified: Secondary | ICD-10-CM

## 2019-02-15 DIAGNOSIS — F1290 Cannabis use, unspecified, uncomplicated: Secondary | ICD-10-CM | POA: Diagnosis present

## 2019-02-15 DIAGNOSIS — F319 Bipolar disorder, unspecified: Secondary | ICD-10-CM | POA: Diagnosis present

## 2019-02-15 DIAGNOSIS — Z792 Long term (current) use of antibiotics: Secondary | ICD-10-CM | POA: Diagnosis not present

## 2019-02-15 DIAGNOSIS — R300 Dysuria: Secondary | ICD-10-CM | POA: Diagnosis present

## 2019-02-15 DIAGNOSIS — Z79899 Other long term (current) drug therapy: Secondary | ICD-10-CM | POA: Diagnosis not present

## 2019-02-15 DIAGNOSIS — Z6839 Body mass index (BMI) 39.0-39.9, adult: Secondary | ICD-10-CM

## 2019-02-15 HISTORY — DX: Essential (primary) hypertension: I10

## 2019-02-15 LAB — CBC WITH DIFFERENTIAL/PLATELET
Abs Immature Granulocytes: 0.04 10*3/uL (ref 0.00–0.07)
Basophils Absolute: 0.1 10*3/uL (ref 0.0–0.1)
Basophils Relative: 1 %
EOS ABS: 0.1 10*3/uL (ref 0.0–0.5)
Eosinophils Relative: 1 %
HCT: 33.9 % — ABNORMAL LOW (ref 36.0–46.0)
Hemoglobin: 10.8 g/dL — ABNORMAL LOW (ref 12.0–15.0)
Immature Granulocytes: 0 %
Lymphocytes Relative: 24 %
Lymphs Abs: 2.4 10*3/uL (ref 0.7–4.0)
MCH: 26.6 pg (ref 26.0–34.0)
MCHC: 31.9 g/dL (ref 30.0–36.0)
MCV: 83.5 fL (ref 80.0–100.0)
Monocytes Absolute: 1.4 10*3/uL — ABNORMAL HIGH (ref 0.1–1.0)
Monocytes Relative: 14 %
Neutro Abs: 5.9 10*3/uL (ref 1.7–7.7)
Neutrophils Relative %: 60 %
Platelets: 311 10*3/uL (ref 150–400)
RBC: 4.06 MIL/uL (ref 3.87–5.11)
RDW: 13 % (ref 11.5–15.5)
WBC: 9.9 10*3/uL (ref 4.0–10.5)
nRBC: 0 % (ref 0.0–0.2)

## 2019-02-15 LAB — HEMOGLOBIN A1C
Hgb A1c MFr Bld: 5.5 % (ref 4.8–5.6)
Mean Plasma Glucose: 111.15 mg/dL

## 2019-02-15 LAB — TYPE AND SCREEN
ABO/RH(D): O POS
Antibody Screen: NEGATIVE

## 2019-02-15 LAB — ABO/RH: ABO/RH(D): O POS

## 2019-02-15 LAB — RPR: RPR: NONREACTIVE

## 2019-02-15 LAB — HIV ANTIBODY (ROUTINE TESTING W REFLEX): HIV SCREEN 4TH GENERATION: NONREACTIVE

## 2019-02-15 MED ORDER — ONDANSETRON HCL 4 MG PO TABS: 4.0000 mg | ORAL_TABLET | Freq: Four times a day (QID) | ORAL | Status: DC | PRN

## 2019-02-15 MED ORDER — PRENATAL MULTIVITAMIN CH: 1.0000 | ORAL_TABLET | Freq: Every day | ORAL | Status: DC

## 2019-02-15 MED ORDER — OXYCODONE HCL 5 MG PO TABS
5.0000 mg | ORAL_TABLET | Freq: Four times a day (QID) | ORAL | Status: DC | PRN
  Administered 2019-02-15 – 2019-02-16 (×4): 10 mg via ORAL
  Filled 2019-02-15 (×4): qty 2

## 2019-02-15 MED ORDER — QUETIAPINE FUMARATE 50 MG PO TABS: 50.0000 mg | ORAL_TABLET | Freq: Every day | ORAL | Status: DC

## 2019-02-15 MED ORDER — POTASSIUM CHLORIDE CRYS ER 20 MEQ PO TBCR
40.0000 meq | EXTENDED_RELEASE_TABLET | Freq: Two times a day (BID) | ORAL | Status: DC
  Administered 2019-02-15 (×2): 40 meq via ORAL
  Filled 2019-02-15 (×2): qty 2

## 2019-02-15 MED ORDER — ACETAMINOPHEN 325 MG PO TABS: 650.0000 mg | ORAL_TABLET | Freq: Four times a day (QID) | ORAL | Status: DC | PRN

## 2019-02-15 MED ORDER — POLYETHYLENE GLYCOL 3350 17 G PO PACK
17.0000 g | PACK | Freq: Every day | ORAL | Status: DC
  Filled 2019-02-15: qty 1

## 2019-02-15 MED ORDER — OXYCODONE-ACETAMINOPHEN 5-325 MG PO TABS
2.0000 | ORAL_TABLET | Freq: Once | ORAL | Status: AC
  Administered 2019-02-15: 2 via ORAL
  Filled 2019-02-15: qty 2

## 2019-02-15 MED ORDER — IBUPROFEN 600 MG PO TABS
600.0000 mg | ORAL_TABLET | Freq: Four times a day (QID) | ORAL | Status: DC | PRN
  Administered 2019-02-15: 600 mg via ORAL
  Filled 2019-02-15: qty 1

## 2019-02-15 MED ORDER — METRONIDAZOLE 500 MG PO TABS
500.0000 mg | ORAL_TABLET | Freq: Two times a day (BID) | ORAL | Status: DC
  Administered 2019-02-15: 500 mg via ORAL
  Filled 2019-02-15: qty 1

## 2019-02-15 MED ORDER — SERTRALINE HCL 100 MG PO TABS
200.0000 mg | ORAL_TABLET | Freq: Every day | ORAL | Status: DC
  Administered 2019-02-15: 200 mg via ORAL
  Filled 2019-02-15: qty 2

## 2019-02-15 MED ORDER — GABAPENTIN 300 MG PO CAPS: 900.0000 mg | ORAL_CAPSULE | Freq: Every morning | ORAL | Status: DC

## 2019-02-15 MED ORDER — QUETIAPINE FUMARATE 50 MG PO TABS
450.0000 mg | ORAL_TABLET | Freq: Every day | ORAL | Status: DC
  Administered 2019-02-15: 450 mg via ORAL
  Filled 2019-02-15: qty 4

## 2019-02-15 MED ORDER — SODIUM CHLORIDE 0.9 % IV SOLN
2.0000 g | Freq: Once | INTRAVENOUS | Status: AC
  Administered 2019-02-15: 2 g via INTRAVENOUS
  Filled 2019-02-15 (×2): qty 2

## 2019-02-15 MED ORDER — SIMETHICONE 80 MG PO CHEW: 80.0000 mg | CHEWABLE_TABLET | Freq: Four times a day (QID) | ORAL | Status: DC | PRN

## 2019-02-15 MED ORDER — SODIUM CHLORIDE 0.9 % IV SOLN: 100.0000 mg | Freq: Once | INTRAVENOUS | Status: DC

## 2019-02-15 MED ORDER — DOXYCYCLINE HYCLATE 100 MG PO TABS
100.0000 mg | ORAL_TABLET | Freq: Two times a day (BID) | ORAL | Status: DC
  Administered 2019-02-15 (×2): 100 mg via ORAL
  Filled 2019-02-15 (×2): qty 1

## 2019-02-15 MED ORDER — NICOTINE 21 MG/24HR TD PT24
21.0000 mg | MEDICATED_PATCH | Freq: Every day | TRANSDERMAL | Status: DC
  Administered 2019-02-15: 21 mg via TRANSDERMAL
  Filled 2019-02-15: qty 1

## 2019-02-15 MED ORDER — ONDANSETRON HCL 4 MG/2ML IJ SOLN: 4.0000 mg | Freq: Four times a day (QID) | INTRAMUSCULAR | Status: DC | PRN

## 2019-02-15 MED ORDER — QUETIAPINE FUMARATE 100 MG PO TABS: 400.0000 mg | ORAL_TABLET | Freq: Every day | ORAL | Status: DC

## 2019-02-15 MED ORDER — NICOTINE 21 MG/24HR TD PT24: 21.0000 mg | MEDICATED_PATCH | Freq: Once | TRANSDERMAL | Status: DC

## 2019-02-15 MED ORDER — PANTOPRAZOLE SODIUM 40 MG PO TBEC
40.0000 mg | DELAYED_RELEASE_TABLET | Freq: Every day | ORAL | Status: DC
  Administered 2019-02-15: 40 mg via ORAL
  Filled 2019-02-15: qty 1

## 2019-02-15 MED ORDER — ALBUTEROL SULFATE (2.5 MG/3ML) 0.083% IN NEBU: 3.0000 mL | INHALATION_SOLUTION | RESPIRATORY_TRACT | Status: DC | PRN

## 2019-02-15 MED ORDER — SODIUM CHLORIDE 0.9 % IV SOLN
2.0000 g | Freq: Four times a day (QID) | INTRAVENOUS | Status: DC
  Administered 2019-02-15 – 2019-02-16 (×4): 2 g via INTRAVENOUS
  Filled 2019-02-15 (×6): qty 2

## 2019-02-15 MED ORDER — DOXYCYCLINE HYCLATE 100 MG PO TABS
100.0000 mg | ORAL_TABLET | Freq: Two times a day (BID) | ORAL | Status: DC
  Administered 2019-02-15: 100 mg via ORAL
  Filled 2019-02-15: qty 1

## 2019-02-15 MED ORDER — OXYCODONE HCL 5 MG PO TABS: 5.0000 mg | ORAL_TABLET | Freq: Four times a day (QID) | ORAL | Status: DC | PRN

## 2019-02-15 MED ORDER — METRONIDAZOLE 500 MG PO TABS
500.0000 mg | ORAL_TABLET | Freq: Two times a day (BID) | ORAL | Status: DC
  Administered 2019-02-15 (×2): 500 mg via ORAL
  Filled 2019-02-15 (×2): qty 1

## 2019-02-15 MED ORDER — LAMOTRIGINE 25 MG PO TABS
50.0000 mg | ORAL_TABLET | Freq: Every day | ORAL | Status: DC
  Administered 2019-02-15: 50 mg via ORAL
  Filled 2019-02-15: qty 2

## 2019-02-15 MED ORDER — NICOTINE 21 MG/24HR TD PT24: 21.0000 mg | MEDICATED_PATCH | Freq: Every day | TRANSDERMAL | Status: DC

## 2019-02-15 MED ORDER — HYDROMORPHONE HCL 1 MG/ML IJ SOLN
0.5000 mg | Freq: Once | INTRAMUSCULAR | Status: AC
  Administered 2019-02-15: 0.5 mg via INTRAVENOUS
  Filled 2019-02-15: qty 1

## 2019-02-15 MED ORDER — HYDROMORPHONE HCL 1 MG/ML IJ SOLN: 1.0000 mg | INTRAMUSCULAR | Status: DC | PRN

## 2019-02-15 MED ORDER — NICOTINE 21 MG/24HR TD PT24
21.0000 mg | MEDICATED_PATCH | Freq: Once | TRANSDERMAL | Status: AC
  Administered 2019-02-15: 21 mg via TRANSDERMAL
  Filled 2019-02-15 (×2): qty 1

## 2019-02-15 MED ORDER — ACETAMINOPHEN 325 MG PO TABS
650.0000 mg | ORAL_TABLET | ORAL | Status: DC | PRN
  Administered 2019-02-15: 650 mg via ORAL
  Filled 2019-02-15: qty 2

## 2019-02-15 NOTE — ED Notes (Signed)
ED TO INPATIENT HANDOFF REPORT  Name/Age/Gender Amy Yoder 33 y.o. female  Code Status Code Status History    Date Active Date Inactive Code Status Order ID Comments User Context   03/11/2014 1640 03/13/2014 1211 Full Code [id number]  Earnstine Regal, PA-C ED      Home/SNF/Other Home  Chief Complaint Colon Pain / Unable to Urinate / Rectal Bleeding   Level of Care/Admitting Diagnosis ED Disposition    ED Disposition Condition [place]: [place] [[id number]]  Level of Care: Med-Surg [16]  Diagnosis: TOA (tubo-ovarian abscess) [353299]  Admitting Physician: Aletha Halim [[id number]]  Attending Physician: Aletha Halim [phone]  Bed request comments: 6 [place]  PT Class (Do Not Modify): Observation [104]  PT Acc Code (Do Not Modify): Observation [10022]       Medical History Past Medical History:  Diagnosis Date  . Bipolar I disorder ([place])   . Bronchitis   . Depression   . Marijuana use   . Tobacco use     Allergies Allergies  Allergen Reactions  . Penicillins Hives, Shortness Of Breath and Swelling    Boils  Has patient had a PCN reaction causing immediate rash, facial/tongue/throat swelling, SOB or lightheadedness with hypotension: yes Has patient had a PCN reaction causing severe rash involving mucus membranes or skin necrosis: no Has patient had a PCN reaction that required hospitalization: yes, visit Has patient had a PCN reaction occurring within the last 10 years: no If all of the above answers are "NO", then may proceed with Cephalosporin use.     IV Location/Drains/Wounds Patient Lines/Drains/Airways Status   Active Line/Drains/Airways    Name:   Placement date:   Placement time:   Site:   Days:   Peripheral IV 02/14/19 Right;Lateral Antecubital   02/14/19    1902    Antecubital   1          Labs/Imaging Results for orders placed or performed during the hospital encounter of 02/14/19 (from  the past 48 hour(s))  ABO/Rh     Status: None   Collection Time: 02/14/19  6:09 PM  Result Value Ref Range   ABO/RH(D)      O POS Performed at [facility], [place] [address], [place], [place] [address]   Comprehensive metabolic panel     Status: Abnormal   Collection Time: 02/14/19  6:12 PM  Result Value Ref Range   Sodium 138 135 - 145 mmol/L   Potassium 3.2 (L) 3.5 - 5.1 mmol/L   Chloride 108 98 - 111 mmol/L   CO2 22 22 - 32 mmol/L   Glucose, Bld 110 (H) 70 - 99 mg/dL   BUN 9 6 - 20 mg/dL   Creatinine, Ser 0.75 0.44 - 1.00 mg/dL   Calcium 9.1 8.9 - 10.3 mg/dL   Total Protein 8.6 (H) 6.5 - 8.1 g/dL   Albumin 4.6 3.5 - 5.0 g/dL   AST 20 15 - 41 U/L   ALT 20 0 - 44 U/L   Alkaline Phosphatase 79 38 - 126 U/L   Total Bilirubin 0.7 0.3 - 1.2 mg/dL   GFR calc non Af Amer >60 >60 mL/min   GFR calc Af Amer >60 >60 mL/min   Anion gap 8 5 - 15    Comment: Performed at [facility], [place] [address], [place], [place] [address]  CBC     Status: Abnormal   Collection Time: 02/14/19  6:12 PM  Result Value Ref Range   WBC 12.7 (H) 4.0 - 10.5 K/uL   RBC 4.44 3.87 - 5.11 MIL/uL   Hemoglobin 12.0 12.0 - 15.0 g/dL   HCT 37.8 36.0 - 46.0 %   MCV 85.1 80.0 - 100.0 fL   MCH 27.0 26.0 - 34.0 pg   MCHC 31.7 30.0 - 36.0 g/dL   RDW 13.0 11.5 - 15.5 %   Platelets 347 150 - 400 K/uL   nRBC 0.0 0.0 - 0.2 %    Comment: Performed at [facility], [place] [address] [place], [place] [address]  Type and screen [place]     Status: None   Collection Time: 02/14/19  6:12 PM  Result Value Ref Range   ABO/RH(D) O POS    Antibody Screen NEG    Sample Expiration      02/17/2019 Performed at [facility], [place] [address] [place], [place] [address]   I-Stat beta hCG blood, ED     Status: None   Collection Time: 02/14/19  6:18 PM  Result Value Ref Range   I-stat hCG, quantitative <5.0 <5 mIU/mL   Comment 3             Comment:   GEST. AGE      CONC.  (mIU/mL)   <=1 WEEK        5 - 50     2 WEEKS       50 - 500     3 WEEKS       100 - 10,000     4 WEEKS     1,000 - 30,000        FEMALE AND NON-PREGNANT FEMALE:     LESS THAN 5 mIU/mL   Urinalysis, Routine w reflex microscopic     Status: Abnormal   Collection Time: 02/14/19  7:58 PM  Result Value Ref Range   Color, Urine YELLOW YELLOW   APPearance CLEAR CLEAR   Specific Gravity, Urine 1.026 1.005 - 1.030   pH 5.0 5.0 - 8.0   Glucose, UA NEGATIVE NEGATIVE mg/dL   Hgb urine dipstick NEGATIVE NEGATIVE   Bilirubin Urine NEGATIVE NEGATIVE   Ketones, ur 20 (A) NEGATIVE mg/dL   Protein, ur NEGATIVE NEGATIVE mg/dL   Nitrite NEGATIVE NEGATIVE   Leukocytes,Ua TRACE (A) NEGATIVE   RBC / HPF 0-5 0 - 5 RBC/hpf   WBC, UA 0-5 0 - 5 WBC/hpf   Bacteria, UA NONE SEEN NONE SEEN   Squamous Epithelial / LPF 0-5 0 - 5   Mucus PRESENT     Comment: Performed at [facility], [place] [address], [place], [place] [address]  POC occult blood, ED     Status: None   Collection Time: 02/14/19  8:44 PM  Result Value Ref Range   Fecal Occult Bld NEGATIVE NEGATIVE  Wet prep, genital     Status: Abnormal   Collection Time: 02/14/19 11:25 PM  Result Value Ref Range   Yeast Wet Prep HPF POC NONE SEEN NONE SEEN   Trich, Wet Prep NONE SEEN NONE SEEN   Clue Cells Wet Prep HPF POC NONE SEEN NONE SEEN   WBC, Wet Prep HPF POC MANY (A) NONE SEEN   Sperm NONE SEEN     Comment: Performed at [facility], [place] [address], [place], [place] [address]   US Transvaginal Non-ob  Result Date: 02/15/2019 CLINICAL DATA:  Initial evaluation for possible infection, PID/TOA EXAM: TRANSABDOMINAL  AND TRANSVAGINAL ULTRASOUND OF PELVIS DOPPLER ULTRASOUND OF OVARIES TECHNIQUE: Both transabdominal and transvaginal ultrasound examinations of the pelvis were performed. Transabdominal technique was performed for global imaging of the pelvis including uterus, ovaries,  adnexal regions, and pelvic cul-de-sac. It was necessary to proceed with endovaginal exam following the transabdominal exam to visualize the uterus, endometrium, and ovaries. Color and duplex Doppler ultrasound was utilized to evaluate blood flow to the ovaries. COMPARISON:  Prior CT from earlier the same day. FINDINGS: Uterus Measurements: 8.2 x 3.5 x 4.9 cm = volume: 75 mL. No fibroids or other mass visualized. Endometrium Thickness: 7.8 mm.  No focal abnormality visualized. Right ovary Measurements: 5.2 x 4.5 x 6.5 cm = volume: 70.8 mL. Complex hypoechoic collection measuring 3.1 x 2.3 x 1.9 cm. Associated peripherally increased vascularity. This is likely paraovarian in location. Adjacent complex hypoechoic collection measures 1.7 x 1.5 x 1.2 cm, also with peripherally increased vascularity. This may be either paraovarian or intra-ovarian in location. Left ovary Measurements: 4.1 x 4.6 x 3.4 cm = volume: 33.6 mL. 2.5 x 2.5 x 2.2 cm complex hypoechoic collection adjacent to the left ovary. This appears paraovarian in location. Additional 2.4 x 1.4 x 1.7 cm hypoechoic complex collection adjacent to the left ovary. Pulsed Doppler evaluation of both ovaries demonstrates normal low-resistance arterial and venous waveforms. Other findings Moderate volume complex free fluid within the pelvis with few scattered internal septations. IMPRESSION: 1. Multiple complex hypoechoic collection/lesions involving the bilateral adnexa, most concerning for tubo-ovarian abscesses in the setting of PID. A short interval follow-up ultrasound following a course of antibiotic therapy is suggested to ensure these changes resolve. 2. Associated moderate volume complex free fluid within the pelvis. 3. No evidence for ovarian torsion or other complication. Electronically Signed   By: Jeannine Boga M.D.   On: 02/15/2019 01:23   US Pelvis Complete  Result Date: 02/15/2019 CLINICAL DATA:  Initial evaluation for possible infection,  PID/TOA EXAM: TRANSABDOMINAL AND TRANSVAGINAL ULTRASOUND OF PELVIS DOPPLER ULTRASOUND OF OVARIES TECHNIQUE: Both transabdominal and transvaginal ultrasound examinations of the pelvis were performed. Transabdominal technique was performed for global imaging of the pelvis including uterus, ovaries, adnexal regions, and pelvic cul-de-sac. It was necessary to proceed with endovaginal exam following the transabdominal exam to visualize the uterus, endometrium, and ovaries. Color and duplex Doppler ultrasound was utilized to evaluate blood flow to the ovaries. COMPARISON:  Prior CT from earlier the same day. FINDINGS: Uterus Measurements: 8.2 x 3.5 x 4.9 cm = volume: 75 mL. No fibroids or other mass visualized. Endometrium Thickness: 7.8 mm.  No focal abnormality visualized. Right ovary Measurements: 5.2 x 4.5 x 6.5 cm = volume: 70.8 mL. Complex hypoechoic collection measuring 3.1 x 2.3 x 1.9 cm. Associated peripherally increased vascularity. This is likely paraovarian in location. Adjacent complex hypoechoic collection measures 1.7 x 1.5 x 1.2 cm, also with peripherally increased vascularity. This may be either paraovarian or intra-ovarian in location. Left ovary Measurements: 4.1 x 4.6 x 3.4 cm = volume: 33.6 mL. 2.5 x 2.5 x 2.2 cm complex hypoechoic collection adjacent to the left ovary. This appears paraovarian in location. Additional 2.4 x 1.4 x 1.7 cm hypoechoic complex collection adjacent to the left ovary. Pulsed Doppler evaluation of both ovaries demonstrates normal low-resistance arterial and venous waveforms. Other findings Moderate volume complex free fluid within the pelvis with few scattered internal septations. IMPRESSION: 1. Multiple complex hypoechoic collection/lesions involving the bilateral adnexa, most concerning for tubo-ovarian abscesses in the setting of PID. A short interval  follow-up ultrasound following a course of antibiotic therapy is suggested to ensure these changes resolve. 2. Associated  moderate volume complex free fluid within the pelvis. 3. No evidence for ovarian torsion or other complication. Electronically Signed   By: Jeannine Boga M.D.   On: 02/15/2019 01:23   Ct Abdomen Pelvis W Contrast  Result Date: 02/14/2019 CLINICAL DATA:  Severe abdominal pain Nausea EXAM: CT ABDOMEN AND PELVIS WITH CONTRAST TECHNIQUE: Multidetector CT imaging of the abdomen and pelvis was performed using the standard protocol following bolus administration of intravenous contrast. CONTRAST:  13mL ISOVUE-300 IOPAMIDOL (ISOVUE-300) INJECTION 61% COMPARISON:  CT of the abdomen and pelvis on 02/02/2019 FINDINGS: Lower chest: No acute abnormality. Hepatobiliary: Cholecystectomy. The liver is otherwise unremarkable. Pancreas: Unremarkable. No pancreatic ductal dilatation or surrounding inflammatory changes. Spleen: Normal in size without focal abnormality. Adrenals/Urinary Tract: Adrenal glands are unremarkable. Kidneys are normal, without renal calculi, focal lesion, or hydronephrosis. Bladder is unremarkable. Stomach/Bowel: The stomach and small bowel loops are normal in appearance. Inflammatory changes in the pelvis, adjacent to the rectosigmoid colon. Otherwise the colon is normal in appearance. The appendix is normal thickness but extends into the pelvis adjacent to inflammatory changes. Vascular/Lymphatic: No significant vascular findings are present. Small bilateral external iliac lymph nodes. Small sigmoid mesentery lymph nodes. Reproductive: The uterus is present. There are numerous rim enhancing fluid collections within the adnexal regions bilaterally suspicious for pelvic inflammatory disease and tubo-ovarian abscesses. These range in size from 2.1-4.3 centimeters. Alternatively the findings could be related to ovarian lesions. Further evaluation pelvic ultrasound is recommended. A tampon is identified within the UPPER vaginal vault. Other: Anterior abdominal wall is unremarkable. Suspect small free  pelvic fluid. Musculoskeletal: No acute or significant osseous findings. IMPRESSION: 1. Numerous rim enhancing fluid collections in the adnexal regions bilaterally, suspicious for pelvic inflammatory disease and tubo-ovarian abscesses. Alternatively the findings could be related to ovarian lesions. Further evaluation with pelvic ultrasound is recommended. 2. The rectosigmoid colon is in close proximity to the inflammatory process and could also be the etiology of these changes but is felt to be less likely. 3. Cholecystectomy. 4. Normal appendix. Electronically Signed   By: Nolon Nations M.D.   On: 02/14/2019 23:00   [place] Art/ven Flow Abd Pelv Doppler  Result Date: 02/15/2019 CLINICAL DATA:  Initial evaluation for possible infection, PID/TOA EXAM: TRANSABDOMINAL AND TRANSVAGINAL ULTRASOUND OF PELVIS DOPPLER ULTRASOUND OF OVARIES TECHNIQUE: Both transabdominal and transvaginal ultrasound examinations of the pelvis were performed. Transabdominal technique was performed for global imaging of the pelvis including uterus, ovaries, adnexal regions, and pelvic cul-de-sac. It was necessary to proceed with endovaginal exam following the transabdominal exam to visualize the uterus, endometrium, and ovaries. Color and duplex Doppler ultrasound was utilized to evaluate blood flow to the ovaries. COMPARISON:  Prior CT from earlier the same day. FINDINGS: Uterus Measurements: 8.2 x 3.5 x 4.9 cm = volume: 75 mL. No fibroids or other mass visualized. Endometrium Thickness: 7.8 mm.  No focal abnormality visualized. Right ovary Measurements: 5.2 x 4.5 x 6.5 cm = volume: 70.8 mL. Complex hypoechoic collection measuring 3.1 x 2.3 x 1.9 cm. Associated peripherally increased vascularity. This is likely paraovarian in location. Adjacent complex hypoechoic collection measures 1.7 x 1.5 x 1.2 cm, also with peripherally increased vascularity. This may be either paraovarian or intra-ovarian in location. Left ovary Measurements: 4.1 x 4.6  x 3.4 cm = volume: 33.6 mL. 2.5 x 2.5 x 2.2 cm complex hypoechoic collection adjacent to the left ovary. This appears  paraovarian in location. Additional 2.4 x 1.4 x 1.7 cm hypoechoic complex collection adjacent to the left ovary. Pulsed Doppler evaluation of both ovaries demonstrates normal low-resistance arterial and venous waveforms. Other findings Moderate volume complex free fluid within the pelvis with few scattered internal septations. IMPRESSION: 1. Multiple complex hypoechoic collection/lesions involving the bilateral adnexa, most concerning for tubo-ovarian abscesses in the setting of PID. A short interval follow-up ultrasound following a course of antibiotic therapy is suggested to ensure these changes resolve. 2. Associated moderate volume complex free fluid within the pelvis. 3. No evidence for ovarian torsion or other complication. Electronically Signed   By: Jeannine Boga M.D.   On: 02/15/2019 01:23    Pending Labs Unresulted Labs (From admission, onward)    Start     Ordered   02/14/19 2311  RPR  (STI Exposure)  Once,   STAT     02/14/19 2310   02/14/19 2311  HIV antibody  (STI Exposure)  Once,   STAT     02/14/19 2310          Vitals/Pain Today's Vitals   02/15/19 0152 02/15/19 0200 02/15/19 0233 02/15/19 0304  BP:  (!) 137/48 (!) 149/77 (!) 144/74  Pulse:  95 94 86  Resp:   19 18  Temp:   98.9 F (37.2 C) 98.6 F (37 C)  TempSrc:    Oral  SpO2:  96% 99% 99%  Weight:      Height:      PainSc: 7        Isolation Precautions No active isolations  Medications Medications  doxycycline (VIBRA-TABS) tablet 100 mg (100 mg Oral Given 02/15/19 0211)  cefOXitin (MEFOXIN) 2 g in sodium chloride 0.9 % 100 mL IVPB (2 g Intravenous New Bag/Given 02/15/19 0257)  metroNIDAZOLE (FLAGYL) tablet 500 mg (500 mg Oral Given 02/15/19 0211)  morphine 4 MG/ML injection 6 mg (6 mg Intravenous Given 02/14/19 1944)  ondansetron (ZOFRAN) injection 4 mg (4 mg Intravenous Given 02/14/19  1944)  sodium chloride 0.9 % bolus 1,000 mL (0 mLs Intravenous Stopped 02/14/19 2139)  potassium chloride SA (K-DUR,KLOR-CON) CR tablet 40 mEq (40 mEq Oral Given 02/14/19 2114)  HYDROmorphone (DILAUDID) injection 1 mg (1 mg Intravenous Given 02/14/19 2134)  iopamidol (ISOVUE-300) 61 % injection 100 mL (100 mLs Intravenous Contrast Given 02/14/19 2212)  sodium chloride (PF) 0.9 % injection (  Given 02/14/19 2212)  HYDROmorphone (DILAUDID) injection 1 mg (1 mg Intravenous Given 02/14/19 2334)  cefTRIAXone (ROCEPHIN) 1 g in sodium chloride 0.9 % 100 mL IVPB (0 g Intravenous Stopped 02/15/19 0023)  azithromycin (ZITHROMAX) tablet 1,000 mg (1,000 mg Oral Given 02/14/19 2333)  ketorolac (TORADOL) 30 MG/ML injection 30 mg (30 mg Intravenous Given 02/14/19 2341)  oxyCODONE-acetaminophen (PERCOCET/ROXICET) 5-325 MG per tablet 2 tablet (2 tablets Oral Given 02/15/19 0110)  HYDROmorphone (DILAUDID) injection 0.5 mg (0.5 mg Intravenous Given 02/15/19 0151)    Mobility walks

## 2019-02-15 NOTE — ED Provider Notes (Signed)
1:50 AM Patient reassessed.  She continues to have significant discomfort despite 2 tablets of Percocet.  She has received a total of 2.5 mg IV Dilaudid, 6 mg morphine, 30 mg Toradol without improvement to her pelvic and abdominal pain.  The patient's ultrasound does show multiple complex hypoechoic lesions of bilateral adnexa concerning for tubo-ovarian abscess in the setting of PID.  She has a leukocytosis of 12.7.  Borderline sepsis criteria with intermittent heart rate above 90.  She has never been hypotensive.  Given persistence of pain, will discuss case with [facility].  The patient is not followed by an OBGYN.  1:58 AM Case discussed with [facility] has accepted the patient in transfer for admission. Requests 2g IV Cefoxitin with oral doxycycline and flagyl BID. Orders placed. Patient updated.   Vitals:   02/15/19 0030 02/15/19 0106 02/15/19 0111 02/15/19 0151  BP: 127/75  (!) 120/54 (!) 144/83  Pulse: 74 80 92 89  Resp: 17  15 16   Temp: 98.6 F (37 C)  98.6 F (37 C) 98.9 F (37.2 C)  TempSrc: Oral  Oral Oral  SpO2: 98% 98% 97% 98%  Weight:      Height:        Results for orders placed or performed during the hospital encounter of 02/14/19  Wet prep, genital  Result Value Ref Range   Yeast Wet Prep HPF POC NONE SEEN NONE SEEN   Trich, Wet Prep NONE SEEN NONE SEEN   Clue Cells Wet Prep HPF POC NONE SEEN NONE SEEN   WBC, Wet Prep HPF POC MANY (A) NONE SEEN   Sperm NONE SEEN   Comprehensive metabolic panel  Result Value Ref Range   Sodium 138 135 - 145 mmol/L   Potassium 3.2 (L) 3.5 - 5.1 mmol/L   Chloride 108 98 - 111 mmol/L   CO2 22 22 - 32 mmol/L   Glucose, Bld 110 (H) 70 - 99 mg/dL   BUN 9 6 - 20 mg/dL   Creatinine, Ser 0.75 0.44 - 1.00 mg/dL   Calcium 9.1 8.9 - 10.3 mg/dL   Total Protein 8.6 (H) 6.5 - 8.1 g/dL   Albumin 4.6 3.5 - 5.0 g/dL   AST 20 15 - 41 U/L   ALT 20 0 - 44 U/L   Alkaline Phosphatase 79 38 - 126 U/L   Total  Bilirubin 0.7 0.3 - 1.2 mg/dL   GFR calc non Af Amer >60 >60 mL/min   GFR calc Af Amer >60 >60 mL/min   Anion gap 8 5 - 15  CBC  Result Value Ref Range   WBC 12.7 (H) 4.0 - 10.5 K/uL   RBC 4.44 3.87 - 5.11 MIL/uL   Hemoglobin 12.0 12.0 - 15.0 g/dL   HCT 37.8 36.0 - 46.0 %   MCV 85.1 80.0 - 100.0 fL   MCH 27.0 26.0 - 34.0 pg   MCHC 31.7 30.0 - 36.0 g/dL   RDW 13.0 11.5 - 15.5 %   Platelets 347 150 - 400 K/uL   nRBC 0.0 0.0 - 0.2 %  Urinalysis, Routine w reflex microscopic  Result Value Ref Range   Color, Urine YELLOW YELLOW   APPearance CLEAR CLEAR   Specific Gravity, Urine 1.026 1.005 - 1.030   pH 5.0 5.0 - 8.0   Glucose, UA NEGATIVE NEGATIVE mg/dL   Hgb urine dipstick NEGATIVE NEGATIVE   Bilirubin Urine NEGATIVE NEGATIVE   Ketones, ur 20 (A) NEGATIVE mg/dL   Protein, ur NEGATIVE  NEGATIVE mg/dL   Nitrite NEGATIVE NEGATIVE   Leukocytes,Ua TRACE (A) NEGATIVE   RBC / HPF 0-5 0 - 5 RBC/hpf   WBC, UA 0-5 0 - 5 WBC/hpf   Bacteria, UA NONE SEEN NONE SEEN   Squamous Epithelial / LPF 0-5 0 - 5   Mucus PRESENT   POC occult blood, ED  Result Value Ref Range   Fecal Occult Bld NEGATIVE NEGATIVE  I-Stat beta hCG blood, ED  Result Value Ref Range   I-stat hCG, quantitative <5.0 <5 mIU/mL   Comment 3          Type and screen [place]  Result Value Ref Range   ABO/RH(D) O POS    Antibody Screen NEG    Sample Expiration      02/17/2019 Performed at [facility] [address] [place], [place] [address]   ABO/Rh  Result Value Ref Range   ABO/RH(D)      O POS Performed at [facility] [address] [place], [place] [address]    US Transvaginal Non-ob  Result Date: 02/15/2019 CLINICAL DATA:  Initial evaluation for possible infection, PID/TOA EXAM: TRANSABDOMINAL AND TRANSVAGINAL ULTRASOUND OF PELVIS DOPPLER ULTRASOUND OF OVARIES TECHNIQUE: Both transabdominal and transvaginal ultrasound examinations of the pelvis  were performed. Transabdominal technique was performed for global imaging of the pelvis including uterus, ovaries, adnexal regions, and pelvic cul-de-sac. It was necessary to proceed with endovaginal exam following the transabdominal exam to visualize the uterus, endometrium, and ovaries. Color and duplex Doppler ultrasound was utilized to evaluate blood flow to the ovaries. COMPARISON:  Prior CT from earlier the same day. FINDINGS: Uterus Measurements: 8.2 x 3.5 x 4.9 cm = volume: 75 mL. No fibroids or other mass visualized. Endometrium Thickness: 7.8 mm.  No focal abnormality visualized. Right ovary Measurements: 5.2 x 4.5 x 6.5 cm = volume: 70.8 mL. Complex hypoechoic collection measuring 3.1 x 2.3 x 1.9 cm. Associated peripherally increased vascularity. This is likely paraovarian in location. Adjacent complex hypoechoic collection measures 1.7 x 1.5 x 1.2 cm, also with peripherally increased vascularity. This may be either paraovarian or intra-ovarian in location. Left ovary Measurements: 4.1 x 4.6 x 3.4 cm = volume: 33.6 mL. 2.5 x 2.5 x 2.2 cm complex hypoechoic collection adjacent to the left ovary. This appears paraovarian in location. Additional 2.4 x 1.4 x 1.7 cm hypoechoic complex collection adjacent to the left ovary. Pulsed Doppler evaluation of both ovaries demonstrates normal low-resistance arterial and venous waveforms. Other findings Moderate volume complex free fluid within the pelvis with few scattered internal septations. IMPRESSION: 1. Multiple complex hypoechoic collection/lesions involving the bilateral adnexa, most concerning for tubo-ovarian abscesses in the setting of PID. A short interval follow-up ultrasound following a course of antibiotic therapy is suggested to ensure these changes resolve. 2. Associated moderate volume complex free fluid within the pelvis. 3. No evidence for ovarian torsion or other complication. Electronically Signed   By: Jeannine Boga M.D.   On: 02/15/2019  01:23   US Pelvis Complete  Result Date: 02/15/2019 CLINICAL DATA:  Initial evaluation for possible infection, PID/TOA EXAM: TRANSABDOMINAL AND TRANSVAGINAL ULTRASOUND OF PELVIS DOPPLER ULTRASOUND OF OVARIES TECHNIQUE: Both transabdominal and transvaginal ultrasound examinations of the pelvis were performed. Transabdominal technique was performed for global imaging of the pelvis including uterus, ovaries, adnexal regions, and pelvic cul-de-sac. It was necessary to proceed with endovaginal exam following the transabdominal exam to visualize the uterus, endometrium, and ovaries. Color and duplex Doppler  ultrasound was utilized to evaluate blood flow to the ovaries. COMPARISON:  Prior CT from earlier the same day. FINDINGS: Uterus Measurements: 8.2 x 3.5 x 4.9 cm = volume: 75 mL. No fibroids or other mass visualized. Endometrium Thickness: 7.8 mm.  No focal abnormality visualized. Right ovary Measurements: 5.2 x 4.5 x 6.5 cm = volume: 70.8 mL. Complex hypoechoic collection measuring 3.1 x 2.3 x 1.9 cm. Associated peripherally increased vascularity. This is likely paraovarian in location. Adjacent complex hypoechoic collection measures 1.7 x 1.5 x 1.2 cm, also with peripherally increased vascularity. This may be either paraovarian or intra-ovarian in location. Left ovary Measurements: 4.1 x 4.6 x 3.4 cm = volume: 33.6 mL. 2.5 x 2.5 x 2.2 cm complex hypoechoic collection adjacent to the left ovary. This appears paraovarian in location. Additional 2.4 x 1.4 x 1.7 cm hypoechoic complex collection adjacent to the left ovary. Pulsed Doppler evaluation of both ovaries demonstrates normal low-resistance arterial and venous waveforms. Other findings Moderate volume complex free fluid within the pelvis with few scattered internal septations. IMPRESSION: 1. Multiple complex hypoechoic collection/lesions involving the bilateral adnexa, most concerning for tubo-ovarian abscesses in the setting of PID. A short interval follow-up  ultrasound following a course of antibiotic therapy is suggested to ensure these changes resolve. 2. Associated moderate volume complex free fluid within the pelvis. 3. No evidence for ovarian torsion or other complication. Electronically Signed   By: Jeannine Boga M.D.   On: 02/15/2019 01:23   Ct Abdomen Pelvis W Contrast  Result Date: 02/14/2019 CLINICAL DATA:  Severe abdominal pain Nausea EXAM: CT ABDOMEN AND PELVIS WITH CONTRAST TECHNIQUE: Multidetector CT imaging of the abdomen and pelvis was performed using the standard protocol following bolus administration of intravenous contrast. CONTRAST:  127mL ISOVUE-300 IOPAMIDOL (ISOVUE-300) INJECTION 61% COMPARISON:  CT of the abdomen and pelvis on 02/02/2019 FINDINGS: Lower chest: No acute abnormality. Hepatobiliary: Cholecystectomy. The liver is otherwise unremarkable. Pancreas: Unremarkable. No pancreatic ductal dilatation or surrounding inflammatory changes. Spleen: Normal in size without focal abnormality. Adrenals/Urinary Tract: Adrenal glands are unremarkable. Kidneys are normal, without renal calculi, focal lesion, or hydronephrosis. Bladder is unremarkable. Stomach/Bowel: The stomach and small bowel loops are normal in appearance. Inflammatory changes in the pelvis, adjacent to the rectosigmoid colon. Otherwise the colon is normal in appearance. The appendix is normal thickness but extends into the pelvis adjacent to inflammatory changes. Vascular/Lymphatic: No significant vascular findings are present. Small bilateral external iliac lymph nodes. Small sigmoid mesentery lymph nodes. Reproductive: The uterus is present. There are numerous rim enhancing fluid collections within the adnexal regions bilaterally suspicious for pelvic inflammatory disease and tubo-ovarian abscesses. These range in size from 2.1-4.3 centimeters. Alternatively the findings could be related to ovarian lesions. Further evaluation pelvic ultrasound is recommended. A tampon  is identified within the UPPER vaginal vault. Other: Anterior abdominal wall is unremarkable. Suspect small free pelvic fluid. Musculoskeletal: No acute or significant osseous findings. IMPRESSION: 1. Numerous rim enhancing fluid collections in the adnexal regions bilaterally, suspicious for pelvic inflammatory disease and tubo-ovarian abscesses. Alternatively the findings could be related to ovarian lesions. Further evaluation with pelvic ultrasound is recommended. 2. The rectosigmoid colon is in close proximity to the inflammatory process and could also be the etiology of these changes but is felt to be less likely. 3. Cholecystectomy. 4. Normal appendix. Electronically Signed   By: Nolon Nations M.D.   On: 02/14/2019 23:00   Ct Abdomen Pelvis W Contrast  Result Date: 02/02/2019 CLINICAL DATA:  33 year old female with  blood in the stool EXAM: CT ABDOMEN AND PELVIS WITH CONTRAST TECHNIQUE: Multidetector CT imaging of the abdomen and pelvis was performed using the standard protocol following bolus administration of intravenous contrast. CONTRAST:  158mL ISOVUE-300 IOPAMIDOL (ISOVUE-300) INJECTION 61% COMPARISON:  None. FINDINGS: Lower chest: No acute abnormality. Hepatobiliary: Unremarkable liver.  Cholecystectomy. Pancreas: Unremarkable pancreas Spleen: Unremarkable spleen Adrenals/Urinary Tract: Unremarkable appearance of the adrenal glands. No evidence of hydronephrosis of the right or left kidney. No nephrolithiasis. Unremarkable course of the bilateral ureters. Unremarkable appearance of the urinary bladder. Stomach/Bowel: Unremarkable stomach. Small bowel unremarkable with no focal wall thickening or transition point. No inflammatory changes of small bowel mesentery. No mesenteric adenopathy. Normal appendix.  Unremarkable TI. Diverticula are present. There are subtle inflammatory changes in the pelvis, including the sigmoid mesentery with questionable engorgement of the vasculature. Hazy stranding  within the fat of the mesentery. There are multiple borderline enlarged lymph nodes. Largest on the sagittal reformatted images measures 15 mm. Sagittal view demonstrates loss of the fat planes in the rectovaginal fat plane. Vascular/Lymphatic: Borderline lymph nodes within the left greater than right pelvic sidewall extending along the IMA/IM V drainage pathway and the right iliac pathway. Unremarkable appearance of the arteries with no atherosclerotic changes. Reproductive: Unremarkable uterus. There appears to be physiologic changes of the bilateral ovaries. Other: No hernia. Musculoskeletal: No displaced fracture. IMPRESSION: Subtle inflammatory changes in the pelvis associated with the rectosigmoid junction. This includes edema, borderline lymph nodes, and questionable engorged vasculature. It is uncertain if these changes are reactive secondary to infection (including diverticulitis, proctitis), or if this could represent a primary abnormality such as inflammatory bowel disease. Correlation with patient's presentation/history recommended, as well as GI referral. Electronically Signed   By: Corrie Mckusick D.O.   On: 02/02/2019 13:21   [place] Art/ven Flow Abd Pelv Doppler  Result Date: 02/15/2019 CLINICAL DATA:  Initial evaluation for possible infection, PID/TOA EXAM: TRANSABDOMINAL AND TRANSVAGINAL ULTRASOUND OF PELVIS DOPPLER ULTRASOUND OF OVARIES TECHNIQUE: Both transabdominal and transvaginal ultrasound examinations of the pelvis were performed. Transabdominal technique was performed for global imaging of the pelvis including uterus, ovaries, adnexal regions, and pelvic cul-de-sac. It was necessary to proceed with endovaginal exam following the transabdominal exam to visualize the uterus, endometrium, and ovaries. Color and duplex Doppler ultrasound was utilized to evaluate blood flow to the ovaries. COMPARISON:  Prior CT from earlier the same day. FINDINGS: Uterus Measurements: 8.2 x 3.5 x 4.9 cm = volume: 75  mL. No fibroids or other mass visualized. Endometrium Thickness: 7.8 mm.  No focal abnormality visualized. Right ovary Measurements: 5.2 x 4.5 x 6.5 cm = volume: 70.8 mL. Complex hypoechoic collection measuring 3.1 x 2.3 x 1.9 cm. Associated peripherally increased vascularity. This is likely paraovarian in location. Adjacent complex hypoechoic collection measures 1.7 x 1.5 x 1.2 cm, also with peripherally increased vascularity. This may be either paraovarian or intra-ovarian in location. Left ovary Measurements: 4.1 x 4.6 x 3.4 cm = volume: 33.6 mL. 2.5 x 2.5 x 2.2 cm complex hypoechoic collection adjacent to the left ovary. This appears paraovarian in location. Additional 2.4 x 1.4 x 1.7 cm hypoechoic complex collection adjacent to the left ovary. Pulsed Doppler evaluation of both ovaries demonstrates normal low-resistance arterial and venous waveforms. Other findings Moderate volume complex free fluid within the pelvis with few scattered internal septations. IMPRESSION: 1. Multiple complex hypoechoic collection/lesions involving the bilateral adnexa, most concerning for tubo-ovarian abscesses in the setting of PID. A short interval follow-up ultrasound following a course  of antibiotic therapy is suggested to ensure these changes resolve. 2. Associated moderate volume complex free fluid within the pelvis. 3. No evidence for ovarian torsion or other complication. Electronically Signed   By: Jeannine Boga M.D.   On: 02/15/2019 01:23      Antonietta Breach, PA-C 02/15/19 0272

## 2019-02-15 NOTE — Plan of Care (Signed)
  Problem: Pain Managment: Goal: General experience of comfort will improve Outcome: Progressing   Problem: Safety: Goal: Ability to remain free from injury will improve Outcome: Progressing   

## 2019-02-15 NOTE — ED Notes (Signed)
Pt approaches the nurses station saying she is leaving her belongings in the room so she can go to her car to get something. Pt is visibly agitated and raising her voice at the nurses station. Pt was informed that she can not leave the emergency room due to liability or risk for injury. Pt states that this is the worst hospital she has ever been in. Pt was informed that if she leaves the emergency room she will be leaving against medical advice.

## 2019-02-15 NOTE — ED Notes (Signed)
Lab called at this time to run HIV and RPR labs.

## 2019-02-15 NOTE — ED Notes (Addendum)
Pt approaches the nurses station and becomes verbally aggressive towards staff members stating "I have been here for 11 hours. You don't know what I've been through" and calling EDP "a fucking prick"

## 2019-02-15 NOTE — ED Notes (Signed)
[facility] dispatch notified for need of transport.  

## 2019-02-15 NOTE — ED Notes (Signed)
Pt walked into room 22 as nurse and tech were cleaning the room. Walked into room 22 and asked "who can she complain too, because we were just sitting around in a room, and how she been here for eleven hours and that she is ready to go." Patient very irritated and cursing at staff

## 2019-02-15 NOTE — H&P (Addendum)
Obstetrics & Gynecology H&P   Date of Admission: 02/15/2019   Requesting Provider: [facility]  Primary OBGYN: None Primary Care Provider: None  Reason for Admission: pelvic abscesses  History of Present Illness: Amy Yoder is a 33 y.o. [id number] (Patient's last menstrual period was 02/14/2019.), with the above CC. PMHx is significant for BMI 39, THC and tobacco abuse, h/o STIs, diverticula  Patient with worsening abdominal pain despite abx therapy. Pt received a dose of rocephin and azithro at [facility] and received a dose of flagyl, doxy and cefoxitin prior to discharge.   Patient feeling better since admission.   ROS: A 12-point review of systems was performed and negative, except as stated in the above HPI.  OBGYN History: As per HPI. OB History  Gravida Para Term Preterm AB Living  5 1 1   4 1   SAB TAB Ectopic Multiple Live Births  2 2     1     # Outcome Date GA Lbr Len/2nd Weight Sex Delivery Anes PTL Lv  5 SAB           4 SAB           3 TAB           2 TAB           1 Term      Vag-Spont      Periods: qmonth, 5 days History of pap smears: unknwon History of STIs: Yes She is currently using none for contraception.    Past Medical History: Past Medical History:  Diagnosis Date  . Bipolar I disorder ([place])   . Bronchitis   . Depression   . Hypertension 02/15/2019  . Marijuana use   . Symptomatic cholelithiasis s/p lap chole 03/12/2014 03/11/2014  . Tobacco use     Past Surgical History: Past Surgical History:  Procedure Laterality Date  . CHOLECYSTECTOMY N/A 03/12/2014   Procedure: LAPAROSCOPIC CHOLECYSTECTOMY WITH INTRAOPERATIVE CHOLANGIOGRAM;  Surgeon: Merrie Roof, MD;  Location: [facility];  Service: General;  Laterality: N/A;  . DENTAL SURGERY    . DILATION AND CURETTAGE OF UTERUS     x2 for EAB    Family History:  Family History  Problem Relation Age of Onset  . Hypertension Mother     Social History:  Social History   Socioeconomic History  . Marital status:  Single    Spouse name: Not on file  . Number of children: Not on file  . Years of education: Not on file  . Highest education level: Not on file  Occupational History  . Not on file  Social Needs  . Financial resource strain: Not on file  . Food insecurity:    Worry: Not on file    Inability: Not on file  . Transportation needs:    Medical: Not on file    Non-medical: Not on file  Tobacco Use  . Smoking status: Current Every Day Smoker    Packs/day: 0.50    Types: Cigarettes  . Smokeless tobacco: Never Used  Substance and Sexual Activity  . Alcohol use: Yes    Comment: 2x/week  . Drug use: Yes    Comment: occasional Marijuana use  . Sexual activity: Yes    Birth control/protection: Condom  Lifestyle  . Physical activity:    Days per week: Not on file    Minutes per session: Not on file  . Stress: Not on file  Relationships  . Social connections:    Talks on  phone: Not on file    Gets together: Not on file    Attends religious service: Not on file    Active member of club or organization: Not on file    Attends meetings of clubs or organizations: Not on file    Relationship status: Not on file  . Intimate partner violence:    Fear of current or ex partner: Not on file    Emotionally abused: Not on file    Physically abused: Not on file    Forced sexual activity: Not on file  Other Topics Concern  . Not on file  Social History Narrative  . Not on file    Allergy: Allergies  Allergen Reactions  . Penicillins Hives, Shortness Of Breath and Swelling    Boils  Has patient had a PCN reaction causing immediate rash, facial/tongue/throat swelling, SOB or lightheadedness with hypotension: yes Has patient had a PCN reaction causing severe rash involving mucus membranes or skin necrosis: no Has patient had a PCN reaction that required hospitalization: yes, visit Has patient had a PCN reaction occurring within the last 10 years: no If all of the above answers are "NO",  then may proceed with Cephalosporin use.     Current Outpatient Medications: Medications Prior to Admission  Medication Sig Dispense Refill Last Dose  . acetaminophen (TYLENOL) 325 MG tablet Take 650 mg by mouth every 6 (six) hours as needed for mild pain or headache.   Past Month at Unknown time  . albuterol (PROVENTIL HFA;VENTOLIN HFA) 108 (90 Base) MCG/ACT inhaler Inhale 1-2 puffs into the lungs every 4 (four) hours as needed for wheezing or shortness of breath. 1 Inhaler 0 Past Week at Unknown time  . gabapentin (NEURONTIN) 300 MG capsule Take 900 mg by mouth every morning.    Past Month at Unknown time  . lamoTRIgine (LAMICTAL) 25 MG tablet Take 50 mg by mouth daily.   02/14/2019 at Unknown time  . QUEtiapine (SEROQUEL) 400 MG tablet Take 400 mg by mouth daily. Pt takes at bedtime with 50mg  for a total dose of 450mg   1 02/13/2019 at Unknown time  . QUEtiapine (SEROQUEL) 50 MG tablet Take 50 mg by mouth at bedtime. Pt takes at bedtime with 400mg  tablet for total dose of 450mg    02/14/2019 at Unknown time  . sertraline (ZOLOFT) 100 MG tablet Take 200 mg by mouth daily.   02/13/2019 at Unknown time  . azithromycin (ZITHROMAX) 250 MG tablet Take 1 tablet (250 mg total) by mouth daily. Take first 2 tablets together, then 1 every day until finished. (Patient not taking: Reported on 08/09/2018) 6 tablet 0 Not Taking at Unknown time  . ciprofloxacin (CIPRO) 500 MG tablet Take 1 tablet (500 mg total) by mouth 2 (two) times daily. (Patient not taking: Reported on 02/14/2019) 14 tablet 0 Not Taking at Unknown time  . diclofenac (VOLTAREN) 50 MG EC tablet Take 1 tablet (50 mg total) by mouth 2 (two) times daily. (Patient not taking: Reported on 02/02/2019) 15 tablet 0 Not Taking at Unknown time  . metroNIDAZOLE (FLAGYL) 500 MG tablet Take 1 tablet (500 mg total) by mouth 3 (three) times daily. One po bid x 7 days (Patient not taking: Reported on 02/14/2019) 21 tablet 0 Not Taking at Unknown time  . omeprazole  (PRILOSEC) 20 MG capsule Take 1 capsule (20 mg total) by mouth daily. (Patient not taking: Reported on 02/02/2019) 30 capsule 0 Not Taking at Unknown time  . ondansetron (ZOFRAN) 4 MG tablet  Take 1 tablet (4 mg total) by mouth every 6 (six) hours. (Patient not taking: Reported on 02/02/2019) 8 tablet 0 Not Taking at Unknown time  . oxyCODONE-acetaminophen (PERCOCET) 5-325 MG tablet Take 1 tablet by mouth every 6 (six) hours as needed. (Patient not taking: Reported on 02/14/2019) 20 tablet 0 Not Taking at Unknown time  . promethazine (PHENERGAN) 25 MG tablet Take 1 tablet (25 mg total) by mouth every 6 (six) hours as needed for nausea or vomiting. (Patient not taking: Reported on 02/14/2019) 15 tablet 0 Not Taking at Unknown time     Hospital Medications: Current Facility-Administered Medications  Medication Dose Route Frequency Provider Last Rate Last Dose  . acetaminophen (TYLENOL) tablet 650 mg  650 mg Oral Q4H PRN Aletha Halim, MD      . cefOXitin (MEFOXIN) 2 g in sodium chloride 0.9 % 100 mL IVPB  2 g Intravenous Q6H Aletha Halim, MD 200 mL/hr at 02/15/19 0749 2 g at 02/15/19 0749  . doxycycline (VIBRA-TABS) tablet 100 mg  100 mg Oral Q12H Aletha Halim, MD      . ibuprofen (ADVIL,MOTRIN) tablet 600 mg  600 mg Oral Q6H PRN Aletha Halim, MD      . metroNIDAZOLE (FLAGYL) tablet 500 mg  500 mg Oral Q12H Aletha Halim, MD      . nicotine (NICODERM CQ - dosed in mg/24 hours) patch 21 mg  21 mg Transdermal Once Antonietta Breach, PA-C   21 mg at 02/15/19 0413  . [START ON 02/16/2019] nicotine (NICODERM CQ - dosed in mg/24 hours) patch 21 mg  21 mg Transdermal Daily Aletha Halim, MD      . ondansetron (ZOFRAN) tablet 4 mg  4 mg Oral Q6H PRN Aletha Halim, MD       Or  . ondansetron (ZOFRAN) injection 4 mg  4 mg Intravenous Q6H PRN Aletha Halim, MD      . oxyCODONE (Oxy IR/ROXICODONE) immediate release tablet 5-10 mg  5-10 mg Oral Q6H PRN Aletha Halim, MD   10 mg at 02/15/19  2694  . polyethylene glycol (MIRALAX / GLYCOLAX) packet 17 g  17 g Oral Daily Tachina Spoonemore, Eduard Clos, MD      . potassium chloride SA (K-DUR,KLOR-CON) CR tablet 40 mEq  40 mEq Oral BID WC Aletha Halim, MD   40 mEq at 02/15/19 0749  . simethicone (MYLICON) chewable tablet 80 mg  80 mg Oral QID PRN Aletha Halim, MD         Physical Exam:  Current Vital Signs 24h Vital Sign Ranges  T 98 F (36.7 C) Temp  Avg: 98.8 F (37.1 C)  Min: 97.9 F (36.6 C)  Max: 99.1 F (37.3 C)  BP 126/68 BP  Min: 108/87  Max: 175/110  HR 77 Pulse  Avg: 89.4  Min: 74  Max: 101  RR 14 Resp  Avg: 16.6  Min: 14  Max: 21  SaO2 99 % Room Air SpO2  Avg: 98.6 %  Min: 95 %  Max: 100 %       24 Hour I/O Current Shift I/O  Time Ins Outs 02/29 0701 - 03/01 0700 In: 1200  Out: -  No intake/output data recorded.   Patient Vitals for the past 24 hrs:  BP Temp Temp src Pulse Resp SpO2 Height Weight  02/15/19 0600 126/68 98 F (36.7 C) Oral 77 - 99 % - -  02/15/19 0435 136/77 97.9 F (36.6 C) Oral 84 14 99 % - -  02/15/19 0355 (!) 142/82  98.4 F (36.9 C) - 88 15 100 % - -  02/15/19 0323 138/79 98.9 F (37.2 C) Oral 84 17 100 % - -  02/15/19 0304 (!) 144/74 98.6 F (37 C) Oral 86 18 99 % - -  02/15/19 0233 (!) 149/77 98.9 F (37.2 C) - 94 19 99 % - -  02/15/19 0200 (!) 137/48 - - 95 - 96 % - -  02/15/19 0151 (!) 144/83 98.9 F (37.2 C) Oral 89 16 98 % - -  02/15/19 0145 - - - 77 - 98 % - -  02/15/19 0130 (!) 144/83 - - 79 - 99 % - -  02/15/19 0111 (!) 120/54 98.6 F (37 C) Oral 92 15 97 % - -  02/15/19 0106 - - - 80 - 98 % - -  02/15/19 0030 127/75 98.6 F (37 C) Oral 74 17 98 % - -  02/15/19 0027 108/87 98.6 F (37 C) Oral 76 15 99 % - -  02/14/19 2355 - - - 87 - 98 % - -  02/14/19 2335 128/64 - - 90 17 100 % - -  02/14/19 2334 128/64 99 F (37.2 C) - 90 15 99 % - -  02/14/19 2234 (!) 148/79 98.9 F (37.2 C) Oral 86 16 99 % - -  02/14/19 2228 - - - 90 - 98 % - -  02/14/19 2202 (!) 152/76 99.1 F  (37.3 C) - 89 15 99 % - -  02/14/19 2149 - - - 88 - 99 % - -  02/14/19 2144 - - - 95 - 97 % - -  02/14/19 2137 - - - 89 - 98 % - -  02/14/19 2115 (!) 144/79 98.9 F (37.2 C) Oral 100 17 99 % - -  02/14/19 2115 - - - 95 - 100 % - -  02/14/19 2036 (!) 152/83 99 F (37.2 C) Oral 100 16 98 % - -  02/14/19 2025 - - - (!) 101 - 100 % - -  02/14/19 2013 - - - 88 - 99 % - -  02/14/19 1957 138/79 99 F (37.2 C) Oral 90 17 98 % - -  02/14/19 1935 (!) 153/88 - - 97 18 95 % - -  02/14/19 1933 (!) 159/93 - - 100 (!) 21 98 % - -  02/14/19 1931 (!) 168/90 99.1 F (37.3 C) Oral 97 20 100 % - -  02/14/19 1930 (!) 168/90 98.9 F (37.2 C) - 97 15 100 % - -  02/14/19 1754 - - - - - - 5\' 2"  (1.575 m) 97.1 kg  02/14/19 1751 (!) 175/110 99 F (37.2 C) Oral 97 16 100 % - -    Body mass index is 39.14 kg/m. General appearance: Well nourished, well developed female in no acute distress.  Cardiovascular: S1, S2 normal, no murmur, rub or gallop, regular rate and rhythm Respiratory:  Clear to auscultation bilateral. Normal respiratory effort Abdomen: positive bowel sounds and no masses, hernias; diffusely non tender to palpation, non distended Neuro/Psych:  Normal mood and affect.  Skin:  Warm and dry.  Extremities: no clubbing, cyanosis, or edema.   Laboratory:  Recent Labs  Lab 02/14/19 1812 02/15/19 0614  WBC 12.7* 9.9  HGB 12.0 10.8*  HCT 37.8 33.9*  PLT 347 311   Recent Labs  Lab 02/14/19 1812  NA 138  K 3.2*  CL 108  CO2 22  BUN 9  CREATININE 0.75  CALCIUM 9.1  PROT  8.6*  BILITOT 0.7  ALKPHOS 79  ALT 20  AST 20  GLUCOSE 110*   No results for input(s): APTT, INR, PTT in the last 168 hours.  Invalid input(s): DRHAPTT Recent Labs  Lab 02/15/19 0614  Amy Yoder   A1c: 5.5  Pending: t&s, GC/CT, rpr, hiv  Imaging:  CLINICAL DATA:  Initial evaluation for possible infection, PID/TOA  EXAM: TRANSABDOMINAL AND TRANSVAGINAL ULTRASOUND OF PELVIS  DOPPLER ULTRASOUND OF  OVARIES  TECHNIQUE: Both transabdominal and transvaginal ultrasound examinations of the pelvis were performed. Transabdominal technique was performed for global imaging of the pelvis including uterus, ovaries, adnexal regions, and pelvic cul-de-sac.  It was necessary to proceed with endovaginal exam following the transabdominal exam to visualize the uterus, endometrium, and ovaries. Color and duplex Doppler ultrasound was utilized to evaluate blood flow to the ovaries.  COMPARISON:  Prior CT from earlier the same day.  FINDINGS: Uterus  Measurements: 8.2 x 3.5 x 4.9 cm = volume: 75 mL. No fibroids or other mass visualized.  Endometrium  Thickness: 7.8 mm.  No focal abnormality visualized.  Right ovary  Measurements: 5.2 x 4.5 x 6.5 cm = volume: 70.8 mL. Complex hypoechoic collection measuring 3.1 x 2.3 x 1.9 cm. Associated peripherally increased vascularity. This is likely paraovarian in location. Adjacent complex hypoechoic collection measures 1.7 x 1.5 x 1.2 cm, also with peripherally increased vascularity. This may be either paraovarian or intra-ovarian in location.  Left ovary  Measurements: 4.1 x 4.6 x 3.4 cm = volume: 33.6 mL. 2.5 x 2.5 x 2.2 cm complex hypoechoic collection adjacent to the left ovary. This appears paraovarian in location. Additional 2.4 x 1.4 x 1.7 cm hypoechoic complex collection adjacent to the left ovary.  Pulsed Doppler evaluation of both ovaries demonstrates normal low-resistance arterial and venous waveforms.  Other findings  Moderate volume complex free fluid within the pelvis with few scattered internal septations.  IMPRESSION: 1. Multiple complex hypoechoic collection/lesions involving the bilateral adnexa, most concerning for tubo-ovarian abscesses in the setting of PID. A short interval follow-up ultrasound following a course of antibiotic therapy is suggested to ensure these changes resolve. 2. Associated  moderate volume complex free fluid within the pelvis. 3. No evidence for ovarian torsion or other complication.   Electronically Signed   By: Jeannine Boga M.D.   On: 02/15/2019 01:23  CLINICAL DATA:  Severe abdominal pain Nausea  EXAM: CT ABDOMEN AND PELVIS WITH CONTRAST  TECHNIQUE: Multidetector CT imaging of the abdomen and pelvis was performed using the standard protocol following bolus administration of intravenous contrast.  CONTRAST:  158mL ISOVUE-300 IOPAMIDOL (ISOVUE-300) INJECTION 61%  COMPARISON:  CT of the abdomen and pelvis on 02/02/2019  FINDINGS: Lower chest: No acute abnormality.  Hepatobiliary: Cholecystectomy. The liver is otherwise unremarkable.  Pancreas: Unremarkable. No pancreatic ductal dilatation or surrounding inflammatory changes.  Spleen: Normal in size without focal abnormality.  Adrenals/Urinary Tract: Adrenal glands are unremarkable. Kidneys are normal, without renal calculi, focal lesion, or hydronephrosis. Bladder is unremarkable.  Stomach/Bowel: The stomach and small bowel loops are normal in appearance. Inflammatory changes in the pelvis, adjacent to the rectosigmoid colon. Otherwise the colon is normal in appearance. The appendix is normal thickness but extends into the pelvis adjacent to inflammatory changes.  Vascular/Lymphatic: No significant vascular findings are present. Small bilateral external iliac lymph nodes. Small sigmoid mesentery lymph nodes.  Reproductive: The uterus is present. There are numerous rim enhancing fluid collections within the adnexal regions bilaterally suspicious for pelvic inflammatory disease and  tubo-ovarian abscesses. These range in size from 2.1-4.3 centimeters. Alternatively the findings could be related to ovarian lesions. Further evaluation pelvic ultrasound is recommended. A tampon is identified within the UPPER vaginal vault.  Other: Anterior abdominal wall is  unremarkable. Suspect small free pelvic fluid.  Musculoskeletal: No acute or significant osseous findings.  IMPRESSION: 1. Numerous rim enhancing fluid collections in the adnexal regions bilaterally, suspicious for pelvic inflammatory disease and tubo-ovarian abscesses. Alternatively the findings could be related to ovarian lesions. Further evaluation with pelvic ultrasound is recommended. 2. The rectosigmoid colon is in close proximity to the inflammatory process and could also be the etiology of these changes but is felt to be less likely. 3. Cholecystectomy. 4. Normal appendix.   Electronically Signed   By: Nolon Nations M.D.   On: 02/14/2019 23:00  Assessment: Ms. Dudek is a 33 y.o. G4P1 (Patient's last menstrual period was 02/14/2019.) who presented to the ED with complaints of abdominal; findings are consistent with pelvic abscess.  Plan: Patient improved. Continue iv abx. Regular diet. SCDs, OOB ad lib. F/u labs. Likely transition to PO abx tomorrow and possibly home late tomorrow  Total time taking care of the patient was 25 minutes, with greater than 50% of the time spent in face to face interaction with the patient.  Durene Romans MD Attending Center for [facility])

## 2019-02-15 NOTE — ED Notes (Signed)
Report attempted to receiving RN at [facility] health.  RN Ivin Booty to call back for report.

## 2019-02-15 NOTE — ED Notes (Signed)
Security called at this time to keep pt in room until carelink arrives for transfer to [facility].  Pt keeps leaving the room and walking around the department upset about wait time.  Pt has been updated several times on plan of care and ETA of carelink.  Pt has been extremely impatient during length of stay in the ED. Security at bedside.

## 2019-02-16 ENCOUNTER — Encounter: Payer: Self-pay | Admitting: Obstetrics and Gynecology

## 2019-02-16 ENCOUNTER — Other Ambulatory Visit: Payer: Self-pay | Admitting: Obstetrics

## 2019-02-16 DIAGNOSIS — A749 Chlamydial infection, unspecified: Secondary | ICD-10-CM

## 2019-02-16 DIAGNOSIS — N739 Female pelvic inflammatory disease, unspecified: Secondary | ICD-10-CM

## 2019-02-16 LAB — GC/CHLAMYDIA PROBE AMP (~~LOC~~) NOT AT ARMC
CHLAMYDIA, DNA PROBE: POSITIVE — AB
Neisseria Gonorrhea: NEGATIVE

## 2019-02-16 MED ORDER — AZITHROMYCIN 500 MG PO TABS: 1000.0000 mg | ORAL_TABLET | Freq: Once | ORAL | 0 refills | Status: AC

## 2019-02-16 MED ORDER — DOXYCYCLINE HYCLATE 100 MG PO TABS: 100.0000 mg | ORAL_TABLET | Freq: Two times a day (BID) | ORAL | 0 refills | Status: DC

## 2019-02-16 MED ORDER — FLUCONAZOLE 150 MG PO TABS: 150.0000 mg | ORAL_TABLET | ORAL | 3 refills | Status: DC

## 2019-02-16 MED ORDER — OXYCODONE-ACETAMINOPHEN 5-325 MG PO TABS: 1.0000 | ORAL_TABLET | Freq: Four times a day (QID) | ORAL | 0 refills | Status: DC | PRN

## 2019-02-16 MED ORDER — ONDANSETRON HCL 4 MG PO TABS: 4.0000 mg | ORAL_TABLET | Freq: Four times a day (QID) | ORAL | 2 refills | Status: DC | PRN

## 2019-02-16 MED ORDER — IBUPROFEN 800 MG PO TABS: 800.0000 mg | ORAL_TABLET | Freq: Three times a day (TID) | ORAL | 1 refills | Status: DC | PRN

## 2019-02-16 MED ORDER — NICOTINE 21 MG/24HR TD PT24: 21.0000 mg | MEDICATED_PATCH | Freq: Every day | TRANSDERMAL | 2 refills | Status: DC

## 2019-02-16 MED ORDER — METRONIDAZOLE 500 MG PO TABS: 500.0000 mg | ORAL_TABLET | Freq: Two times a day (BID) | ORAL | 0 refills | Status: DC

## 2019-02-16 NOTE — Discharge Instructions (Signed)
Pelvic Inflammatory Disease    Pelvic inflammatory disease (PID) refers to an infection in some or all of the female organs. The infection can be in the uterus, ovaries, fallopian tubes, or the surrounding tissues in the pelvis. PID can cause abdominal or pelvic pain that comes on suddenly (acute pelvic pain). PID is a serious infection because it can lead to lasting (chronic) pelvic pain or the inability to have children (infertility).  What are the causes?  This condition is most often caused by an infection that is spread during sexual contact. However, the infection can also be caused by the normal bacteria that are found in the vaginal tissues if these bacteria travel upward into the reproductive organs. PID can also occur following:   The birth of a baby.   A miscarriage.   An abortion.   Major pelvic surgery.   The use of an intrauterine device (IUD).   A sexual assault.  What increases the risk?  This condition is more likely to develop in women who:   Are younger than 35 years of age.   Are sexually active at ayoung age.   Use nonbarrier contraception.   Have multiple sexual partners.   Have sex with someone who has symptoms of an STD (sexually transmitted disease).   Use oral contraception.  At times, certain behaviors can also increase the possibility of getting PID, such as:   Using a vaginal douche.   Having an IUD in place.  What are the signs or symptoms?  Symptoms of this condition include:   Abdominal or pelvic pain.   Fever.   Chills.   Abnormal vaginal discharge.   Abnormal uterine bleeding.   Unusual pain shortly after the end of a menstrual period.   Painful urination.   Pain with sexual intercourse.   Nausea and vomiting.  How is this diagnosed?  To diagnose this condition, your health care provider will do a physical exam and take your medical history. A pelvic exam typically reveals great tenderness in the uterus and the surrounding pelvic tissues. You may also have  tests, such as:   Lab tests, including a pregnancy test, blood tests, and urine test.   Culture tests of the vagina and cervix to check for an STD.   Ultrasound.   A laparoscopic procedure to look inside the pelvis.   Examining vaginal secretions under a microscope.  How is this treated?  Treatment for this condition may involve one or more approaches.   Antibiotic medicines may be prescribed to be taken by mouth.   Sexual partners may need to be treated if the infection is caused by an STD.   For more severe cases, hospitalization may be needed to give antibiotics directly into a vein through an IV tube.   Surgery may be needed if other treatments do not help, but this is rare.  It may take weeks until you are completely well. If you are diagnosed with PID, you should also be checked for human immunodeficiency virus (HIV). Your health care provider may test you for infection again 3 months after treatment. You should not have unprotected sex.  Follow these instructions at home:   Take over-the-counter and prescription medicines only as told by your health care provider.   If you were prescribed an antibiotic medicine, take it as told by your health care provider. Do not stop taking the antibiotic even if you start to feel better.   Do not have sexual intercourse until treatment is   completed or as told by your health care provider. If PID is confirmed, your recent sexual partners will need treatment, especially if you had unprotected sex.   Keep all follow-up visits as told by your health care provider. This is important.  Contact a health care provider if:   You have increased or abnormal vaginal discharge.   Your pain does not improve.   You vomit.   You have a fever.   You cannot tolerate your medicines.   Your partner has an STD.   You have pain when you urinate.  Get help right away if:   You have increased abdominal or pelvic pain.   You have chills.   Your symptoms are not better in 72  hours even with treatment.  This information is not intended to replace advice given to you by your health care provider. Make sure you discuss any questions you have with your health care provider.  Document Released: 12/03/2005 Document Revised: 05/10/2016 Document Reviewed: 01/10/2015  Elsevier Interactive Patient Education  2019 [employer]

## 2019-02-16 NOTE — Care Management (Signed)
Patient returned to [facility] after discharge. Stated she went to pharmacy to get prescriptions filled and was told her Medicaid is no longer active. Pharmacy directed her back to hospital for [facility] letter. Provided [place] letter.   Magdalen Spatz RN BSN [phone]

## 2019-02-16 NOTE — Discharge Summary (Signed)
Physician Discharge Summary  Patient ID: Amy Yoder MRN: [id number] DOB/AGE: 35-Nov-1987 35 y.o.  Admit date: 02/14/2019 Discharge date: 02/16/2019  Admission Diagnoses and Discharge Diagnoses:  Principal Problem:   TOA (tubo-ovarian abscess) Active Problems:   Hypertension   BMI 39.0-39.9,adult   Pelvic abscess in female  Discharged Condition: stable  Hospital Course: Patient was admitted with increasing pain. No fevers but significant pelvic pain and abdominal bloating.  Imaging showed multiloculated pelvic abscesses involving adnexa and also rectosigmpoid inflammation. She was started on IV antibiotics (Cefoxitin/Doxycycline/Flagyl) while in the ED; also received one dose each of Ceftriaxone 250 mg IV and Azithromycin 100 mg orally. Her pain was managed on NSAIDs and Percocet.  No worsening symptoms noted, pain improved. No urinary or bowel problems, no fevers, WBC went down to 9.9. On HD#3, she was deemed stable to discharge to home with extended course of oral antibiotics, will also follow up in the office (message sent to [facility] to schedule appointment in 1-2 weeks with Dr. Jodi Mourning; she wanted him to be her GYN provider).   Consults: None  Significant Diagnostic Studies:  CBC Latest Ref Rng & Units 02/15/2019 02/14/2019 02/02/2019  WBC 4.0 - 10.5 K/uL 9.9 12.7(H) 9.7  Hemoglobin 12.0 - 15.0 g/dL 10.8(L) 12.0 11.7(L)  Hematocrit 36.0 - 46.0 % 33.9(L) 37.8 37.4  Platelets 150 - 400 K/uL 311 347 331   CMP Latest Ref Rng & Units 02/14/2019 02/02/2019 08/09/2018  Glucose 70 - 99 mg/dL 110(H) 104(H) 94  BUN 6 - 20 mg/dL 9 9 13   Creatinine 0.44 - 1.00 mg/dL 0.75 0.74 0.65  Sodium 135 - 145 mmol/L 138 138 138  Potassium 3.5 - 5.1 mmol/L 3.2(L) 3.4(L) 3.8  Chloride 98 - 111 mmol/L 108 106 107  CO2 22 - 32 mmol/L 22 23 22   Calcium 8.9 - 10.3 mg/dL 9.1 8.5(L) 9.1  Total Protein 6.5 - 8.1 g/dL 8.6(H) 7.1 6.8  Total Bilirubin 0.3 - 1.2 mg/dL 0.7 0.2(L) 0.5  Alkaline Phos 38 - 126  U/L 79 86 74  AST 15 - 41 U/L 20 18 19   ALT 0 - 44 U/L 20 16 18    US Transvaginal Non-ob  Result Date: 02/15/2019 CLINICAL DATA:  Initial evaluation for possible infection, PID/TOA EXAM: TRANSABDOMINAL AND TRANSVAGINAL ULTRASOUND OF PELVIS DOPPLER ULTRASOUND OF OVARIES TECHNIQUE: Both transabdominal and transvaginal ultrasound examinations of the pelvis were performed. Transabdominal technique was performed for global imaging of the pelvis including uterus, ovaries, adnexal regions, and pelvic cul-de-sac. It was necessary to proceed with endovaginal exam following the transabdominal exam to visualize the uterus, endometrium, and ovaries. Color and duplex Doppler ultrasound was utilized to evaluate blood flow to the ovaries. COMPARISON:  Prior CT from earlier the same day. FINDINGS: Uterus Measurements: 8.2 x 3.5 x 4.9 cm = volume: 75 mL. No fibroids or other mass visualized. Endometrium Thickness: 7.8 mm.  No focal abnormality visualized. Right ovary Measurements: 5.2 x 4.5 x 6.5 cm = volume: 70.8 mL. Complex hypoechoic collection measuring 3.1 x 2.3 x 1.9 cm. Associated peripherally increased vascularity. This is likely paraovarian in location. Adjacent complex hypoechoic collection measures 1.7 x 1.5 x 1.2 cm, also with peripherally increased vascularity. This may be either paraovarian or intra-ovarian in location. Left ovary Measurements: 4.1 x 4.6 x 3.4 cm = volume: 33.6 mL. 2.5 x 2.5 x 2.2 cm complex hypoechoic collection adjacent to the left ovary. This appears paraovarian in location. Additional 2.4 x 1.4 x 1.7 cm hypoechoic complex collection adjacent to the  left ovary. Pulsed Doppler evaluation of both ovaries demonstrates normal low-resistance arterial and venous waveforms. Other findings Moderate volume complex free fluid within the pelvis with few scattered internal septations. IMPRESSION: 1. Multiple complex hypoechoic collection/lesions involving the bilateral adnexa, most concerning for  tubo-ovarian abscesses in the setting of PID. A short interval follow-up ultrasound following a course of antibiotic therapy is suggested to ensure these changes resolve. 2. Associated moderate volume complex free fluid within the pelvis. 3. No evidence for ovarian torsion or other complication. Electronically Signed   By: Jeannine Boga M.D.   On: 02/15/2019 01:23   US Pelvis Complete  Result Date: 02/15/2019 CLINICAL DATA:  Initial evaluation for possible infection, PID/TOA EXAM: TRANSABDOMINAL AND TRANSVAGINAL ULTRASOUND OF PELVIS DOPPLER ULTRASOUND OF OVARIES TECHNIQUE: Both transabdominal and transvaginal ultrasound examinations of the pelvis were performed. Transabdominal technique was performed for global imaging of the pelvis including uterus, ovaries, adnexal regions, and pelvic cul-de-sac. It was necessary to proceed with endovaginal exam following the transabdominal exam to visualize the uterus, endometrium, and ovaries. Color and duplex Doppler ultrasound was utilized to evaluate blood flow to the ovaries. COMPARISON:  Prior CT from earlier the same day. FINDINGS: Uterus Measurements: 8.2 x 3.5 x 4.9 cm = volume: 75 mL. No fibroids or other mass visualized. Endometrium Thickness: 7.8 mm.  No focal abnormality visualized. Right ovary Measurements: 5.2 x 4.5 x 6.5 cm = volume: 70.8 mL. Complex hypoechoic collection measuring 3.1 x 2.3 x 1.9 cm. Associated peripherally increased vascularity. This is likely paraovarian in location. Adjacent complex hypoechoic collection measures 1.7 x 1.5 x 1.2 cm, also with peripherally increased vascularity. This may be either paraovarian or intra-ovarian in location. Left ovary Measurements: 4.1 x 4.6 x 3.4 cm = volume: 33.6 mL. 2.5 x 2.5 x 2.2 cm complex hypoechoic collection adjacent to the left ovary. This appears paraovarian in location. Additional 2.4 x 1.4 x 1.7 cm hypoechoic complex collection adjacent to the left ovary. Pulsed Doppler evaluation of both  ovaries demonstrates normal low-resistance arterial and venous waveforms. Other findings Moderate volume complex free fluid within the pelvis with few scattered internal septations. IMPRESSION: 1. Multiple complex hypoechoic collection/lesions involving the bilateral adnexa, most concerning for tubo-ovarian abscesses in the setting of PID. A short interval follow-up ultrasound following a course of antibiotic therapy is suggested to ensure these changes resolve. 2. Associated moderate volume complex free fluid within the pelvis. 3. No evidence for ovarian torsion or other complication. Electronically Signed   By: Jeannine Boga M.D.   On: 02/15/2019 01:23   Ct Abdomen Pelvis W Contrast  Result Date: 02/14/2019 CLINICAL DATA:  Severe abdominal pain Nausea EXAM: CT ABDOMEN AND PELVIS WITH CONTRAST TECHNIQUE: Multidetector CT imaging of the abdomen and pelvis was performed using the standard protocol following bolus administration of intravenous contrast. CONTRAST:  136mL ISOVUE-300 IOPAMIDOL (ISOVUE-300) INJECTION 61% COMPARISON:  CT of the abdomen and pelvis on 02/02/2019 FINDINGS: Lower chest: No acute abnormality. Hepatobiliary: Cholecystectomy. The liver is otherwise unremarkable. Pancreas: Unremarkable. No pancreatic ductal dilatation or surrounding inflammatory changes. Spleen: Normal in size without focal abnormality. Adrenals/Urinary Tract: Adrenal glands are unremarkable. Kidneys are normal, without renal calculi, focal lesion, or hydronephrosis. Bladder is unremarkable. Stomach/Bowel: The stomach and small bowel loops are normal in appearance. Inflammatory changes in the pelvis, adjacent to the rectosigmoid colon. Otherwise the colon is normal in appearance. The appendix is normal thickness but extends into the pelvis adjacent to inflammatory changes. Vascular/Lymphatic: No significant vascular findings are present. Small bilateral  external iliac lymph nodes. Small sigmoid mesentery lymph nodes.  Reproductive: The uterus is present. There are numerous rim enhancing fluid collections within the adnexal regions bilaterally suspicious for pelvic inflammatory disease and tubo-ovarian abscesses. These range in size from 2.1-4.3 centimeters. Alternatively the findings could be related to ovarian lesions. Further evaluation pelvic ultrasound is recommended. A tampon is identified within the UPPER vaginal vault. Other: Anterior abdominal wall is unremarkable. Suspect small free pelvic fluid. Musculoskeletal: No acute or significant osseous findings. IMPRESSION: 1. Numerous rim enhancing fluid collections in the adnexal regions bilaterally, suspicious for pelvic inflammatory disease and tubo-ovarian abscesses. Alternatively the findings could be related to ovarian lesions. Further evaluation with pelvic ultrasound is recommended. 2. The rectosigmoid colon is in close proximity to the inflammatory process and could also be the etiology of these changes but is felt to be less likely. 3. Cholecystectomy. 4. Normal appendix. Electronically Signed   By: Nolon Nations M.D.   On: 02/14/2019 23:00   Ct Abdomen Pelvis W Contrast  Result Date: 02/02/2019 CLINICAL DATA:  33 year old female with blood in the stool EXAM: CT ABDOMEN AND PELVIS WITH CONTRAST TECHNIQUE: Multidetector CT imaging of the abdomen and pelvis was performed using the standard protocol following bolus administration of intravenous contrast. CONTRAST:  184mL ISOVUE-300 IOPAMIDOL (ISOVUE-300) INJECTION 61% COMPARISON:  None. FINDINGS: Lower chest: No acute abnormality. Hepatobiliary: Unremarkable liver.  Cholecystectomy. Pancreas: Unremarkable pancreas Spleen: Unremarkable spleen Adrenals/Urinary Tract: Unremarkable appearance of the adrenal glands. No evidence of hydronephrosis of the right or left kidney. No nephrolithiasis. Unremarkable course of the bilateral ureters. Unremarkable appearance of the urinary bladder. Stomach/Bowel: Unremarkable  stomach. Small bowel unremarkable with no focal wall thickening or transition point. No inflammatory changes of small bowel mesentery. No mesenteric adenopathy. Normal appendix.  Unremarkable TI. Diverticula are present. There are subtle inflammatory changes in the pelvis, including the sigmoid mesentery with questionable engorgement of the vasculature. Hazy stranding within the fat of the mesentery. There are multiple borderline enlarged lymph nodes. Largest on the sagittal reformatted images measures 15 mm. Sagittal view demonstrates loss of the fat planes in the rectovaginal fat plane. Vascular/Lymphatic: Borderline lymph nodes within the left greater than right pelvic sidewall extending along the IMA/IM V drainage pathway and the right iliac pathway. Unremarkable appearance of the arteries with no atherosclerotic changes. Reproductive: Unremarkable uterus. There appears to be physiologic changes of the bilateral ovaries. Other: No hernia. Musculoskeletal: No displaced fracture. IMPRESSION: Subtle inflammatory changes in the pelvis associated with the rectosigmoid junction. This includes edema, borderline lymph nodes, and questionable engorged vasculature. It is uncertain if these changes are reactive secondary to infection (including diverticulitis, proctitis), or if this could represent a primary abnormality such as inflammatory bowel disease. Correlation with patient's presentation/history recommended, as well as GI referral. Electronically Signed   By: Corrie Mckusick D.O.   On: 02/02/2019 13:21   [place] Art/ven Flow Abd Pelv Doppler  Result Date: 02/15/2019 CLINICAL DATA:  Initial evaluation for possible infection, PID/TOA EXAM: TRANSABDOMINAL AND TRANSVAGINAL ULTRASOUND OF PELVIS DOPPLER ULTRASOUND OF OVARIES TECHNIQUE: Both transabdominal and transvaginal ultrasound examinations of the pelvis were performed. Transabdominal technique was performed for global imaging of the pelvis including uterus, ovaries,  adnexal regions, and pelvic cul-de-sac. It was necessary to proceed with endovaginal exam following the transabdominal exam to visualize the uterus, endometrium, and ovaries. Color and duplex Doppler ultrasound was utilized to evaluate blood flow to the ovaries. COMPARISON:  Prior CT from earlier the same day. FINDINGS: Uterus Measurements: 8.2 x  3.5 x 4.9 cm = volume: 75 mL. No fibroids or other mass visualized. Endometrium Thickness: 7.8 mm.  No focal abnormality visualized. Right ovary Measurements: 5.2 x 4.5 x 6.5 cm = volume: 70.8 mL. Complex hypoechoic collection measuring 3.1 x 2.3 x 1.9 cm. Associated peripherally increased vascularity. This is likely paraovarian in location. Adjacent complex hypoechoic collection measures 1.7 x 1.5 x 1.2 cm, also with peripherally increased vascularity. This may be either paraovarian or intra-ovarian in location. Left ovary Measurements: 4.1 x 4.6 x 3.4 cm = volume: 33.6 mL. 2.5 x 2.5 x 2.2 cm complex hypoechoic collection adjacent to the left ovary. This appears paraovarian in location. Additional 2.4 x 1.4 x 1.7 cm hypoechoic complex collection adjacent to the left ovary. Pulsed Doppler evaluation of both ovaries demonstrates normal low-resistance arterial and venous waveforms. Other findings Moderate volume complex free fluid within the pelvis with few scattered internal septations. IMPRESSION: 1. Multiple complex hypoechoic collection/lesions involving the bilateral adnexa, most concerning for tubo-ovarian abscesses in the setting of PID. A short interval follow-up ultrasound following a course of antibiotic therapy is suggested to ensure these changes resolve. 2. Associated moderate volume complex free fluid within the pelvis. 3. No evidence for ovarian torsion or other complication. Electronically Signed   By: Jeannine Boga M.D.   On: 02/15/2019 01:23    Discharge Exam: Blood pressure 138/66, pulse 80, temperature 98.5 F (36.9 C), temperature source Oral,  resp. rate 18, height 5\' 2"  (1.575 m), weight 97.1 kg, last menstrual period 02/14/2019, SpO2 100 %. General appearance: alert and no distress Resp: clear to auscultation bilaterally Cardio: regular rate and rhythm GI: soft, mild diffuse tenderness in lower abdomen, no rebound or guarding; bowel sounds normal; no masses,  no organomegaly Pelvic: deferred Extremities: extremities normal, atraumatic, no cyanosis or edema and Homans sign is negative, no sign of DVT Pulses: 2+ and symmetric Skin: Skin color, texture, turgor normal. No rashes or lesions Neurologic: Grossly normal    Discharge disposition: 01-Home or Self Care      Discharge Instructions    Call MD for:  persistant nausea and vomiting   Complete by:  As directed    Call MD for:  severe uncontrolled pain   Complete by:  As directed    Call MD for:  temperature >100.4   Complete by:  As directed    Diet general   Complete by:  As directed    Increase activity slowly   Complete by:  As directed      Allergies as of 02/16/2019      Reactions   Penicillins Hives, Shortness Of Breath, Swelling   Boils  Has patient had a PCN reaction causing immediate rash, facial/tongue/throat swelling, SOB or lightheadedness with hypotension: yes Has patient had a PCN reaction causing severe rash involving mucus membranes or skin necrosis: no Has patient had a PCN reaction that required hospitalization: yes, visit Has patient had a PCN reaction occurring within the last 10 years: no If all of the above answers are "NO", then may proceed with Cephalosporin use.      Medication List    STOP taking these medications   acetaminophen 325 MG tablet Commonly known as:  TYLENOL   azithromycin 250 MG tablet Commonly known as:  ZITHROMAX   ciprofloxacin 500 MG tablet Commonly known as:  CIPRO   diclofenac 50 MG EC tablet Commonly known as:  VOLTAREN     TAKE these medications   albuterol 108 (90 Base) MCG/ACT inhaler  Commonly  known as:  PROVENTIL HFA;VENTOLIN HFA Inhale 1-2 puffs into the lungs every 4 (four) hours as needed for wheezing or shortness of breath.   doxycycline 100 MG tablet Commonly known as:  VIBRA-TABS Take 1 tablet (100 mg total) by mouth 2 (two) times daily for 14 days.   fluconazole 150 MG tablet Commonly known as:  DIFLUCAN Take 1 tablet (150 mg total) by mouth every 3 (three) days. For three doses   gabapentin 300 MG capsule Commonly known as:  NEURONTIN Take 900 mg by mouth every morning.   ibuprofen 800 MG tablet Commonly known as:  ADVIL,MOTRIN Take 1 tablet (800 mg total) by mouth 3 (three) times daily with meals as needed for headache, mild pain or cramping.   lamoTRIgine 25 MG tablet Commonly known as:  LAMICTAL Take 50 mg by mouth daily.   metroNIDAZOLE 500 MG tablet Commonly known as:  FLAGYL Take 1 tablet (500 mg total) by mouth 2 (two) times daily for 14 days. What changed:    when to take this  additional instructions   nicotine 21 mg/24hr patch Commonly known as:  NICODERM CQ - dosed in mg/24 hours Place 1 patch (21 mg total) onto the skin at bedtime.   omeprazole 20 MG capsule Commonly known as:  PRILOSEC Take 1 capsule (20 mg total) by mouth daily.   ondansetron 4 MG tablet Commonly known as:  ZOFRAN Take 1 tablet (4 mg total) by mouth every 6 (six) hours as needed for nausea or vomiting. What changed:    when to take this  reasons to take this   oxyCODONE-acetaminophen 5-325 MG tablet Commonly known as:  PERCOCET Take 1 tablet by mouth every 6 (six) hours as needed for severe pain. What changed:  reasons to take this   promethazine 25 MG tablet Commonly known as:  PHENERGAN Take 1 tablet (25 mg total) by mouth every 6 (six) hours as needed for nausea or vomiting.   QUEtiapine 50 MG tablet Commonly known as:  SEROQUEL Take 50 mg by mouth at bedtime. Pt takes at bedtime with 400mg  tablet for total dose of 450mg    QUEtiapine 400 MG  tablet Commonly known as:  SEROQUEL Take 400 mg by mouth daily. Pt takes at bedtime with 50mg  for a total dose of 450mg    sertraline 100 MG tablet Commonly known as:  ZOLOFT Take 200 mg by mouth daily.      Follow-up Information    Shelly Bombard, MD Follow up in 2 week(s).   Specialty:  Obstetrics and Gynecology Why:  You will be contacted with appointment time and date Contact information: [address] [place] [address] [phone]           Signed: Verita Schneiders 02/16/2019, 7:24 AM

## 2019-03-02 ENCOUNTER — Other Ambulatory Visit: Payer: Self-pay

## 2019-03-02 ENCOUNTER — Ambulatory Visit (INDEPENDENT_AMBULATORY_CARE_PROVIDER_SITE_OTHER): Payer: Medicaid Other | Admitting: Obstetrics

## 2019-03-02 ENCOUNTER — Encounter: Payer: Self-pay | Admitting: Obstetrics

## 2019-03-02 VITALS — BP 136/92 | HR 81 | Wt 214.0 lb

## 2019-03-02 DIAGNOSIS — N731 Chronic parametritis and pelvic cellulitis: Secondary | ICD-10-CM

## 2019-03-02 DIAGNOSIS — R102 Pelvic and perineal pain: Secondary | ICD-10-CM | POA: Diagnosis not present

## 2019-03-02 DIAGNOSIS — A749 Chlamydial infection, unspecified: Secondary | ICD-10-CM

## 2019-03-02 MED ORDER — IBUPROFEN 800 MG PO TABS: 800.0000 mg | ORAL_TABLET | Freq: Three times a day (TID) | ORAL | 1 refills | Status: DC | PRN

## 2019-03-02 MED ORDER — DOXYCYCLINE HYCLATE 100 MG PO TABS: 100.0000 mg | ORAL_TABLET | Freq: Two times a day (BID) | ORAL | 0 refills | Status: DC

## 2019-03-02 MED ORDER — METRONIDAZOLE 500 MG PO TABS: 500.0000 mg | ORAL_TABLET | Freq: Two times a day (BID) | ORAL | 0 refills | Status: DC

## 2019-03-02 NOTE — Progress Notes (Signed)
RGYN pt here to F/U.on tubo-ovarian absesses.  Pt had U/S on 02/14/19  CC: pt states she is still in pain , 7/10x. Pt states she has IBP 800 mg still not much relief almost out.   Pt states she just recently stopped bleeding.

## 2019-03-02 NOTE — Progress Notes (Signed)
Patient ID: Amy Yoder, female   DOB: 07/13/1986, 33 y.o.   MRN: [id number]  Chief Complaint  Patient presents with  . Follow-up    HPI Amy Yoder is a 33 y.o. female.  Admitted to [facility] on 01-14-2019 for PID with TOA's.  Treated and discharged home on 2 weeks of Doxy / Flagyl.  She has taken the po course of Doxy / Flagyl, and presents today for 2 week follow up.  Still complaining of pelvic pain.  She admits to still having sex with her untreated infected partner, and says that no one told her that she has chlamydia and that her partner needs treatment.  She denies fever. HPI  Past Medical History:  Diagnosis Date  . Bipolar I disorder ([place])   . Bronchitis   . Depression   . Hypertension 02/15/2019  . Marijuana use   . Symptomatic cholelithiasis s/p lap chole 03/12/2014 03/11/2014  . Tobacco use     Past Surgical History:  Procedure Laterality Date  . CHOLECYSTECTOMY N/A 03/12/2014   Procedure: LAPAROSCOPIC CHOLECYSTECTOMY WITH INTRAOPERATIVE CHOLANGIOGRAM;  Surgeon: Merrie Roof, MD;  Location: [facility];  Service: General;  Laterality: N/A;  . DENTAL SURGERY    . DILATION AND CURETTAGE OF UTERUS     x2 for EAB    Family History  Problem Relation Age of Onset  . Hypertension Mother     Social History Social History   Tobacco Use  . Smoking status: Current Every Day Smoker    Packs/day: 0.50    Types: Cigarettes  . Smokeless tobacco: Never Used  Substance Use Topics  . Alcohol use: Yes    Comment: 2x/week  . Drug use: Yes    Comment: occasional Marijuana use    Allergies  Allergen Reactions  . Penicillins Hives, Shortness Of Breath and Swelling    Boils  Has patient had a PCN reaction causing immediate rash, facial/tongue/throat swelling, SOB or lightheadedness with hypotension: yes Has patient had a PCN reaction causing severe rash involving mucus membranes or skin necrosis: no Has patient had a PCN reaction that required hospitalization:  yes, visit Has patient had a PCN reaction occurring within the last 10 years: no If all of the above answers are "NO", then may proceed with Cephalosporin use.     Current Outpatient Medications  Medication Sig Dispense Refill  . albuterol (PROVENTIL HFA;VENTOLIN HFA) 108 (90 Base) MCG/ACT inhaler Inhale 1-2 puffs into the lungs every 4 (four) hours as needed for wheezing or shortness of breath. 1 Inhaler 0  . gabapentin (NEURONTIN) 300 MG capsule Take 900 mg by mouth every morning.     Marland Kitchen ibuprofen (ADVIL,MOTRIN) 800 MG tablet Take 1 tablet (800 mg total) by mouth 3 (three) times daily with meals as needed for headache, mild pain or cramping. 60 tablet 1  . lamoTRIgine (LAMICTAL) 25 MG tablet Take 50 mg by mouth daily.    . ondansetron (ZOFRAN) 4 MG tablet Take 1 tablet (4 mg total) by mouth every 6 (six) hours as needed for nausea or vomiting. 20 tablet 2  . promethazine (PHENERGAN) 25 MG tablet Take 1 tablet (25 mg total) by mouth every 6 (six) hours as needed for nausea or vomiting. 15 tablet 0  . QUEtiapine (SEROQUEL) 400 MG tablet Take 400 mg by mouth daily. Pt takes at bedtime with 50mg  for a total dose of 450mg   1  . QUEtiapine (SEROQUEL) 50 MG tablet Take 50 mg by mouth at bedtime. Pt  takes at bedtime with 400mg  tablet for total dose of 450mg     . sertraline (ZOLOFT) 100 MG tablet Take 200 mg by mouth daily.    Marland Kitchen azithromycin (ZITHROMAX) 500 MG tablet TAKE 2 TABLETS BY MOUTH ONCE FOR 1 DOSE    . doxycycline (VIBRA-TABS) 100 MG tablet Take 1 tablet (100 mg total) by mouth 2 (two) times daily for 14 days. 28 tablet 0  . fluconazole (DIFLUCAN) 150 MG tablet Take 1 tablet (150 mg total) by mouth every 3 (three) days. For three doses (Patient not taking: Reported on 03/02/2019) 3 tablet 3  . metroNIDAZOLE (FLAGYL) 500 MG tablet Take 1 tablet (500 mg total) by mouth 2 (two) times daily for 14 days. 28 tablet 0  . nicotine (NICODERM CQ - DOSED IN MG/24 HOURS) 21 mg/24hr patch Place 1 patch (21  mg total) onto the skin at bedtime. (Patient not taking: Reported on 03/02/2019) 28 patch 2  . omeprazole (PRILOSEC) 20 MG capsule Take 1 capsule (20 mg total) by mouth daily. (Patient not taking: Reported on 02/02/2019) 30 capsule 0  . oxyCODONE-acetaminophen (PERCOCET) 5-325 MG tablet Take 1 tablet by mouth every 6 (six) hours as needed for severe pain. (Patient not taking: Reported on 03/02/2019) 30 tablet 0   No current facility-administered medications for this visit.     Review of Systems Review of Systems Constitutional: negative for fatigue and weight loss Respiratory: negative for cough and wheezing Cardiovascular: negative for chest pain, fatigue and palpitations Gastrointestinal: negative for abdominal pain and change in bowel habits Genitourinary:positive for pelvic pain Integument/breast: negative for nipple discharge Musculoskeletal:negative for myalgias Neurological: negative for gait problems and tremors Behavioral/Psych: negative for abusive relationship, depression Endocrine: negative for temperature intolerance      Blood pressure (!) 136/92, pulse 81, weight 214 lb (97.1 kg), last menstrual period 02/14/2019.  Physical Exam Physical Exam General:   alert  Skin:   no rash or abnormalities  Lungs:   clear to auscultation bilaterally  Heart:   regular rate and rhythm, S1, S2 normal, no murmur, click, rub or gallop  Breasts:   normal without suspicious masses, skin or nipple changes or axillary nodes  Abdomen:  normal findings: no organomegaly, soft, non-tender and no hernia  Pelvis:  External genitalia: normal general appearance Urinary system: urethral meatus normal and bladder without fullness, nontender Vaginal: normal without tenderness, induration or masses Cervix: normal appearance Adnexa: normal bimanual exam Uterus: anteverted and non-tender, normal size    50% of 15 min visit spent on counseling and coordination of care.   Data Reviewed Wet Prep and  Cultures  Assessment     1. Chronic PID (chronic pelvic inflammatory disease) Rx: - metroNIDAZOLE (FLAGYL) 500 MG tablet; Take 1 tablet (500 mg total) by mouth 2 (two) times daily for 14 days.  Dispense: 28 tablet; Refill: 0 - ibuprofen (ADVIL,MOTRIN) 800 MG tablet; Take 1 tablet (800 mg total) by mouth 3 (three) times daily with meals as needed for headache, mild pain or cramping.  Dispense: 60 tablet; Refill: 1 - doxycycline (VIBRA-TABS) 100 MG tablet; Take 1 tablet (100 mg total) by mouth 2 (two) times daily for 14 days.  Dispense: 28 tablet; Refill: 0  2. Chlamydia infection  3. Pelvic pain   Plan    Follow up in 2 weeks for pelvic exam   No orders of the defined types were placed in this encounter.  Meds ordered this encounter  Medications  . metroNIDAZOLE (FLAGYL) 500 MG tablet  Sig: Take 1 tablet (500 mg total) by mouth 2 (two) times daily for 14 days.    Dispense:  28 tablet    Refill:  0  . ibuprofen (ADVIL,MOTRIN) 800 MG tablet    Sig: Take 1 tablet (800 mg total) by mouth 3 (three) times daily with meals as needed for headache, mild pain or cramping.    Dispense:  60 tablet    Refill:  1  . doxycycline (VIBRA-TABS) 100 MG tablet    Sig: Take 1 tablet (100 mg total) by mouth 2 (two) times daily for 14 days.    Dispense:  28 tablet    Refill:  0     Shelly Bombard MD 03-02-2019

## 2019-03-11 ENCOUNTER — Telehealth: Payer: Self-pay | Admitting: Obstetrics

## 2019-03-13 ENCOUNTER — Ambulatory Visit: Payer: Medicaid Other | Admitting: Obstetrics

## 2019-03-16 ENCOUNTER — Other Ambulatory Visit: Payer: Self-pay

## 2019-03-16 ENCOUNTER — Other Ambulatory Visit (HOSPITAL_COMMUNITY)
Admission: RE | Admit: 2019-03-16 | Discharge: 2019-03-16 | Disposition: A | Discharge status: Home / Self Care | Payer: Medicaid Other | Source: Ambulatory Visit | Attending: Obstetrics | Admitting: Obstetrics

## 2019-03-16 ENCOUNTER — Ambulatory Visit: Payer: Medicaid Other | Admitting: Obstetrics

## 2019-03-16 ENCOUNTER — Encounter: Payer: Self-pay | Admitting: Obstetrics

## 2019-03-16 VITALS — BP 143/83 | HR 86 | Ht 63.0 in | Wt 216.8 lb

## 2019-03-16 DIAGNOSIS — R102 Pelvic and perineal pain: Secondary | ICD-10-CM | POA: Diagnosis not present

## 2019-03-16 DIAGNOSIS — B379 Candidiasis, unspecified: Secondary | ICD-10-CM | POA: Diagnosis not present

## 2019-03-16 DIAGNOSIS — N898 Other specified noninflammatory disorders of vagina: Secondary | ICD-10-CM | POA: Diagnosis not present

## 2019-03-16 DIAGNOSIS — Z3009 Encounter for other general counseling and advice on contraception: Secondary | ICD-10-CM

## 2019-03-16 DIAGNOSIS — Z30015 Encounter for initial prescription of vaginal ring hormonal contraceptive: Secondary | ICD-10-CM | POA: Diagnosis not present

## 2019-03-16 DIAGNOSIS — A5611 Chlamydial female pelvic inflammatory disease: Secondary | ICD-10-CM | POA: Diagnosis not present

## 2019-03-16 DIAGNOSIS — F17210 Nicotine dependence, cigarettes, uncomplicated: Secondary | ICD-10-CM

## 2019-03-16 DIAGNOSIS — Z30011 Encounter for initial prescription of contraceptive pills: Secondary | ICD-10-CM

## 2019-03-16 MED ORDER — VARENICLINE TARTRATE 1 MG PO TABS: 1.0000 mg | ORAL_TABLET | Freq: Two times a day (BID) | ORAL | 1 refills | Status: DC

## 2019-03-16 MED ORDER — IBUPROFEN 800 MG PO TABS: 800.0000 mg | ORAL_TABLET | Freq: Three times a day (TID) | ORAL | 5 refills | Status: DC | PRN

## 2019-03-16 MED ORDER — DROSPIRENONE 4 MG PO TABS: 1.0000 | ORAL_TABLET | Freq: Every day | ORAL | 11 refills | Status: DC

## 2019-03-16 MED ORDER — VARENICLINE TARTRATE 0.5 MG X 11 & 1 MG X 42 PO MISC: ORAL | 0 refills | Status: DC

## 2019-03-16 MED ORDER — DOXYCYCLINE HYCLATE 100 MG PO TABS: 100.0000 mg | ORAL_TABLET | Freq: Two times a day (BID) | ORAL | 0 refills | Status: AC

## 2019-03-16 MED ORDER — ETONOGESTREL-ETHINYL ESTRADIOL 0.12-0.015 MG/24HR VA RING: VAGINAL_RING | VAGINAL | 12 refills | Status: DC

## 2019-03-16 MED ORDER — METRONIDAZOLE 500 MG PO TABS: 500.0000 mg | ORAL_TABLET | Freq: Two times a day (BID) | ORAL | 0 refills | Status: DC

## 2019-03-16 MED ORDER — OXYCODONE-ACETAMINOPHEN 5-325 MG PO TABS: 1.0000 | ORAL_TABLET | Freq: Four times a day (QID) | ORAL | 0 refills | Status: DC | PRN

## 2019-03-16 MED ORDER — FLUCONAZOLE 150 MG PO TABS: 150.0000 mg | ORAL_TABLET | Freq: Once | ORAL | 2 refills | Status: AC

## 2019-03-16 NOTE — Progress Notes (Signed)
Patient ID: Amy Yoder, female   DOB: Jun 23, 1986, 33 y.o.   MRN: [id number]  Chief Complaint  Patient presents with  . Follow-up    HPI Amy Yoder is a 33 y.o. female.  History of PID with TOA's, bilateral, treated with inpatient IV antibiotics, and discharged home on po Doxy / Flagyl.  Presents today for follow up.  Complains of mild pelvic pain, much improved. HPI  Past Medical History:  Diagnosis Date  . Bipolar I disorder ([place])   . Bronchitis   . Depression   . Hypertension 02/15/2019  . Marijuana use   . Symptomatic cholelithiasis s/p lap chole 03/12/2014 03/11/2014  . Tobacco use     Past Surgical History:  Procedure Laterality Date  . CHOLECYSTECTOMY N/A 03/12/2014   Procedure: LAPAROSCOPIC CHOLECYSTECTOMY WITH INTRAOPERATIVE CHOLANGIOGRAM;  Surgeon: Merrie Roof, MD;  Location: [facility];  Service: General;  Laterality: N/A;  . DENTAL SURGERY    . DILATION AND CURETTAGE OF UTERUS     x2 for EAB    Family History  Problem Relation Age of Onset  . Hypertension Mother     Social History Social History   Tobacco Use  . Smoking status: Current Every Day Smoker    Packs/day: 0.50    Types: Cigarettes  . Smokeless tobacco: Never Used  Substance Use Topics  . Alcohol use: Yes    Comment: 2x/week  . Drug use: Yes    Comment: occasional Marijuana use    Allergies  Allergen Reactions  . Penicillins Hives, Shortness Of Breath and Swelling    Boils  Has patient had a PCN reaction causing immediate rash, facial/tongue/throat swelling, SOB or lightheadedness with hypotension: yes Has patient had a PCN reaction causing severe rash involving mucus membranes or skin necrosis: no Has patient had a PCN reaction that required hospitalization: yes, visit Has patient had a PCN reaction occurring within the last 10 years: no If all of the above answers are "NO", then may proceed with Cephalosporin use.     Current Outpatient Medications  Medication  Sig Dispense Refill  . albuterol (PROVENTIL HFA;VENTOLIN HFA) 108 (90 Base) MCG/ACT inhaler Inhale 1-2 puffs into the lungs every 4 (four) hours as needed for wheezing or shortness of breath. 1 Inhaler 0  . doxycycline (VIBRA-TABS) 100 MG tablet Take 1 tablet (100 mg total) by mouth 2 (two) times daily for 14 days. 28 tablet 0  . gabapentin (NEURONTIN) 300 MG capsule Take 900 mg by mouth every morning.     Marland Kitchen ibuprofen (ADVIL,MOTRIN) 800 MG tablet Take 1 tablet (800 mg total) by mouth 3 (three) times daily with meals as needed for headache, mild pain or cramping. 60 tablet 1  . lamoTRIgine (LAMICTAL) 25 MG tablet Take 50 mg by mouth daily.    . metroNIDAZOLE (FLAGYL) 500 MG tablet Take 1 tablet (500 mg total) by mouth 2 (two) times daily for 14 days. 28 tablet 0  . omeprazole (PRILOSEC) 20 MG capsule Take 1 capsule (20 mg total) by mouth daily. 30 capsule 0  . ondansetron (ZOFRAN) 4 MG tablet Take 1 tablet (4 mg total) by mouth every 6 (six) hours as needed for nausea or vomiting. 20 tablet 2  . promethazine (PHENERGAN) 25 MG tablet Take 1 tablet (25 mg total) by mouth every 6 (six) hours as needed for nausea or vomiting. 15 tablet 0  . QUEtiapine (SEROQUEL) 400 MG tablet Take 400 mg by mouth daily. Pt takes at bedtime with 50mg   for a total dose of 450mg   1  . QUEtiapine (SEROQUEL) 50 MG tablet Take 50 mg by mouth at bedtime. Pt takes at bedtime with 400mg  tablet for total dose of 450mg     . sertraline (ZOLOFT) 100 MG tablet Take 200 mg by mouth daily.    Marland Kitchen azithromycin (ZITHROMAX) 500 MG tablet TAKE 2 TABLETS BY MOUTH ONCE FOR 1 DOSE    . etonogestrel-ethinyl estradiol (NUVARING) 0.12-0.015 MG/24HR vaginal ring Insert vaginally and leave in place for 3 consecutive weeks, then remove for 1 week. 1 each 12  . fluconazole (DIFLUCAN) 150 MG tablet Take 1 tablet (150 mg total) by mouth every 3 (three) days. For three doses (Patient not taking: Reported on 03/16/2019) 3 tablet 3  . fluconazole (DIFLUCAN)  150 MG tablet Take 1 tablet (150 mg total) by mouth once for 1 dose. 1 tablet 2  . ibuprofen (ADVIL,MOTRIN) 800 MG tablet Take 1 tablet (800 mg total) by mouth every 8 (eight) hours as needed. 30 tablet 5  . nicotine (NICODERM CQ - DOSED IN MG/24 HOURS) 21 mg/24hr patch Place 1 patch (21 mg total) onto the skin at bedtime. (Patient not taking: Reported on 03/02/2019) 28 patch 2  . oxyCODONE-acetaminophen (PERCOCET) 5-325 MG tablet Take 1 tablet by mouth every 6 (six) hours as needed for severe pain. (Patient not taking: Reported on 03/02/2019) 30 tablet 0  . oxyCODONE-acetaminophen (PERCOCET/ROXICET) 5-325 MG tablet Take 1-2 tablets by mouth every 6 (six) hours as needed for severe pain. 30 tablet 0   No current facility-administered medications for this visit.     Review of Systems Review of Systems Constitutional: negative for fatigue and weight loss Respiratory: negative for cough and wheezing Cardiovascular: negative for chest pain, fatigue and palpitations Gastrointestinal: negative for abdominal pain and change in bowel habits Genitourinary:pelvic pain, mild Integument/breast: negative for nipple discharge Musculoskeletal:negative for myalgias Neurological: negative for gait problems and tremors Behavioral/Psych: negative for abusive relationship, depression Endocrine: negative for temperature intolerance      Blood pressure (!) 143/83, pulse 86, height 5\' 3"  (1.6 m), weight 216 lb 12.8 oz (98.3 kg), last menstrual period 02/14/2019.  Physical Exam Physical Exam           General:  Alert and no distress Abdomen:  normal findings: no organomegaly, soft, non-tender and no hernia  Pelvis:  External genitalia: normal general appearance Urinary system: urethral meatus normal and bladder without fullness, nontender Vaginal: normal without tenderness, induration or masses Cervix: normal appearance Adnexa: mildly tender, bilaterally.  No masses palpable. Uterus: anteverted and  non-tender, normal size    50% of 15 min visit spent on counseling and coordination of care.   Data Reviewed Wet Prep and Cultures Results for JAZARI, OBER (MRN [id number]) as of 03/16/2019 11:53  Ref. Range 02/15/2019 06:14  Mean Plasma Glucose Latest Units: mg/dL 111.15  WBC Latest Ref Range: 4.0 - 10.5 K/uL 9.9  RBC Latest Ref Range: 3.87 - 5.11 MIL/uL 4.06  Hemoglobin Latest Ref Range: 12.0 - 15.0 g/dL 10.8 (L)  HCT Latest Ref Range: 36.0 - 46.0 % 33.9 (L)  MCV Latest Ref Range: 80.0 - 100.0 fL 83.5  MCH Latest Ref Range: 26.0 - 34.0 pg 26.6  MCHC Latest Ref Range: 30.0 - 36.0 g/dL 31.9  RDW Latest Ref Range: 11.5 - 15.5 % 13.0  Platelets Latest Ref Range: 150 - 400 K/uL 311  nRBC Latest Ref Range: 0.0 - 0.2 % 0.0  Neutrophils Latest Units: % 60  Lymphocytes Latest  Units: % 24  Monocytes Relative Latest Units: % 14  Eosinophil Latest Units: % 1  Basophil Latest Units: % 1  Immature Granulocytes Latest Units: % 0  NEUT# Latest Ref Range: 1.7 - 7.7 K/uL 5.9  Lymphocyte # Latest Ref Range: 0.7 - 4.0 K/uL 2.4  Monocyte # Latest Ref Range: 0.1 - 1.0 K/uL 1.4 (H)  Eosinophils Absolute Latest Ref Range: 0.0 - 0.5 K/uL 0.1  Basophils Absolute Latest Ref Range: 0.0 - 0.1 K/uL 0.1  Abs Immature Granulocytes Latest Ref Range: 0.00 - 0.07 K/uL 0.04  Hemoglobin A1C Latest Ref Range: 4.8 - 5.6 % 5.5  Sample Expiration Unknown 02/18/2019...  Antibody Screen Unknown NEG  ABO/RH(D) Unknown O POS    Results for LUCRESIA, SIMIC (MRN [id number]) as of 03/16/2019 11:53  Ref. Range 02/15/2019 06:14  Mean Plasma Glucose Latest Units: mg/dL 111.15  WBC Latest Ref Range: 4.0 - 10.5 K/uL 9.9  RBC Latest Ref Range: 3.87 - 5.11 MIL/uL 4.06  Hemoglobin Latest Ref Range: 12.0 - 15.0 g/dL 10.8 (L)  HCT Latest Ref Range: 36.0 - 46.0 % 33.9 (L)  MCV Latest Ref Range: 80.0 - 100.0 fL 83.5  MCH Latest Ref Range: 26.0 - 34.0 pg 26.6  MCHC Latest Ref Range: 30.0 - 36.0 g/dL 31.9  RDW  Latest Ref Range: 11.5 - 15.5 % 13.0  Platelets Latest Ref Range: 150 - 400 K/uL 311  nRBC Latest Ref Range: 0.0 - 0.2 % 0.0  Neutrophils Latest Units: % 60  Lymphocytes Latest Units: % 24  Monocytes Relative Latest Units: % 14  Eosinophil Latest Units: % 1  Basophil Latest Units: % 1  Immature Granulocytes Latest Units: % 0  NEUT# Latest Ref Range: 1.7 - 7.7 K/uL 5.9  Lymphocyte # Latest Ref Range: 0.7 - 4.0 K/uL 2.4  Monocyte # Latest Ref Range: 0.1 - 1.0 K/uL 1.4 (H)  Eosinophils Absolute Latest Ref Range: 0.0 - 0.5 K/uL 0.1  Basophils Absolute Latest Ref Range: 0.0 - 0.1 K/uL 0.1  Abs Immature Granulocytes Latest Ref Range: 0.00 - 0.07 K/uL 0.04  Hemoglobin A1C Latest Ref Range: 4.8 - 5.6 % 5.5  Sample Expiration Unknown 02/18/2019...  Antibody Screen Unknown NEG  ABO/RH(D) Unknown O POS   Results for ILYANA, MANUELE (MRN [id number]) as of 03/16/2019 11:53  Ref. Range 02/15/2019 06:14  Mean Plasma Glucose Latest Units: mg/dL 111.15  WBC Latest Ref Range: 4.0 - 10.5 K/uL 9.9  RBC Latest Ref Range: 3.87 - 5.11 MIL/uL 4.06  Hemoglobin Latest Ref Range: 12.0 - 15.0 g/dL 10.8 (L)  HCT Latest Ref Range: 36.0 - 46.0 % 33.9 (L)  MCV Latest Ref Range: 80.0 - 100.0 fL 83.5  MCH Latest Ref Range: 26.0 - 34.0 pg 26.6  MCHC Latest Ref Range: 30.0 - 36.0 g/dL 31.9  RDW Latest Ref Range: 11.5 - 15.5 % 13.0  Platelets Latest Ref Range: 150 - 400 K/uL 311  nRBC Latest Ref Range: 0.0 - 0.2 % 0.0  Neutrophils Latest Units: % 60  Lymphocytes Latest Units: % 24  Monocytes Relative Latest Units: % 14  Eosinophil Latest Units: % 1  Basophil Latest Units: % 1  Immature Granulocytes Latest Units: % 0  NEUT# Latest Ref Range: 1.7 - 7.7 K/uL 5.9  Lymphocyte # Latest Ref Range: 0.7 - 4.0 K/uL 2.4  Monocyte # Latest Ref Range: 0.1 - 1.0 K/uL 1.4 (H)  Eosinophils Absolute Latest Ref Range: 0.0 - 0.5 K/uL 0.1  Basophils Absolute Latest Ref Range: 0.0 -  0.1 K/uL 0.1  Abs Immature Granulocytes  Latest Ref Range: 0.00 - 0.07 K/uL 0.04  Hemoglobin A1C Latest Ref Range: 4.8 - 5.6 % 5.5  Sample Expiration Unknown 02/18/2019...  Antibody Screen Unknown NEG  ABO/RH(D) Unknown O POS   Results for YAMILETTE, GARRETSON (MRN [id number]) as of 03/16/2019 11:53  Ref. Range 02/15/2019 06:14  Mean Plasma Glucose Latest Units: mg/dL 111.15  WBC Latest Ref Range: 4.0 - 10.5 K/uL 9.9  RBC Latest Ref Range: 3.87 - 5.11 MIL/uL 4.06  Hemoglobin Latest Ref Range: 12.0 - 15.0 g/dL 10.8 (L)  HCT Latest Ref Range: 36.0 - 46.0 % 33.9 (L)  MCV Latest Ref Range: 80.0 - 100.0 fL 83.5  MCH Latest Ref Range: 26.0 - 34.0 pg 26.6  MCHC Latest Ref Range: 30.0 - 36.0 g/dL 31.9  RDW Latest Ref Range: 11.5 - 15.5 % 13.0  Platelets Latest Ref Range: 150 - 400 K/uL 311  nRBC Latest Ref Range: 0.0 - 0.2 % 0.0  Neutrophils Latest Units: % 60  Lymphocytes Latest Units: % 24  Monocytes Relative Latest Units: % 14  Eosinophil Latest Units: % 1  Basophil Latest Units: % 1  Immature Granulocytes Latest Units: % 0  NEUT# Latest Ref Range: 1.7 - 7.7 K/uL 5.9  Lymphocyte # Latest Ref Range: 0.7 - 4.0 K/uL 2.4  Monocyte # Latest Ref Range: 0.1 - 1.0 K/uL 1.4 (H)  Eosinophils Absolute Latest Ref Range: 0.0 - 0.5 K/uL 0.1  Basophils Absolute Latest Ref Range: 0.0 - 0.1 K/uL 0.1  Abs Immature Granulocytes Latest Ref Range: 0.00 - 0.07 K/uL 0.04  Hemoglobin A1C Latest Ref Range: 4.8 - 5.6 % 5.5  Sample Expiration Unknown 02/18/2019...  Antibody Screen Unknown NEG  ABO/RH(D) Unknown O POS   Results for ELEONORE, SHIPPEE (MRN [id number]) as of 03/16/2019 11:53  Ref. Range 02/14/2019 00:00  Chlamydia Unknown **POSITIVE** (A)  Neisseria gonorrhea Unknown Negative   Results for SHAUNAE, SIELOFF (MRN [id number]) as of 03/16/2019 11:53  Ref. Range 02/14/2019 23:25  Yeast Wet Prep HPF POC Latest Ref Range: NONE SEEN  NONE SEEN  Trich, Wet Prep Latest Ref Range: NONE SEEN  NONE SEEN  Clue Cells Wet Prep HPF POC  Latest Ref Range: NONE SEEN  NONE SEEN  WBC, Wet Prep HPF POC Latest Ref Range: NONE SEEN  MANY (A)   Ultrasound: US Pelvis Complete (Accession [id number]) (Order [id number])  Imaging  Date: 02/14/2019 Department: [facility] Released By/Authorizing: Domenic Moras, PA-C (auto-released)  Exam Information   Status Exam Begun  Exam Ended   Final [99] 02/14/2019 11:34 PM 02/15/2019 12:36 AM  PACS Images   Show images for US Pelvis Complete  Study Result   CLINICAL DATA:  Initial evaluation for possible infection, PID/TOA  EXAM: TRANSABDOMINAL AND TRANSVAGINAL ULTRASOUND OF PELVIS  DOPPLER ULTRASOUND OF OVARIES  TECHNIQUE: Both transabdominal and transvaginal ultrasound examinations of the pelvis were performed. Transabdominal technique was performed for global imaging of the pelvis including uterus, ovaries, adnexal regions, and pelvic cul-de-sac.  It was necessary to proceed with endovaginal exam following the transabdominal exam to visualize the uterus, endometrium, and ovaries. Color and duplex Doppler ultrasound was utilized to evaluate blood flow to the ovaries.  COMPARISON:  Prior CT from earlier the same day.  FINDINGS: Uterus  Measurements: 8.2 x 3.5 x 4.9 cm = volume: 75 mL. No fibroids or other mass visualized.  Endometrium  Thickness: 7.8 mm.  No focal abnormality visualized.  Right  ovary  Measurements: 5.2 x 4.5 x 6.5 cm = volume: 70.8 mL. Complex hypoechoic collection measuring 3.1 x 2.3 x 1.9 cm. Associated peripherally increased vascularity. This is likely paraovarian in location. Adjacent complex hypoechoic collection measures 1.7 x 1.5 x 1.2 cm, also with peripherally increased vascularity. This may be either paraovarian or intra-ovarian in location.  Left ovary  Measurements: 4.1 x 4.6 x 3.4 cm = volume: 33.6 mL. 2.5 x 2.5 x 2.2 cm complex hypoechoic collection adjacent to the left ovary. This appears  paraovarian in location. Additional 2.4 x 1.4 x 1.7 cm hypoechoic complex collection adjacent to the left ovary.  Pulsed Doppler evaluation of both ovaries demonstrates normal low-resistance arterial and venous waveforms.  Other findings  Moderate volume complex free fluid within the pelvis with few scattered internal septations.  IMPRESSION: 1. Multiple complex hypoechoic collection/lesions involving the bilateral adnexa, most concerning for tubo-ovarian abscesses in the setting of PID. A short interval follow-up ultrasound following a course of antibiotic therapy is suggested to ensure these changes resolve. 2. Associated moderate volume complex free fluid within the pelvis. 3. No evidence for ovarian torsion or other complication.   Electronically Signed   By: Jeannine Boga M.D.   On: 02/15/2019 01:23    Assessment     1. 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  Dispense: 28 tablet; Refill: 0 - metroNIDAZOLE (FLAGYL) 500 MG tablet; Take 1 tablet (500 mg total) by mouth 2 (two) times daily for 14 days.  Dispense: 28 tablet; Refill: 0  2. Pelvic pain Rx: - oxyCODONE-acetaminophen (PERCOCET/ROXICET) 5-325 MG tablet; Take 1-2 tablets by mouth every 6 (six) hours as needed for severe pain.  Dispense: 30 tablet; Refill: 0 - ibuprofen (ADVIL,MOTRIN) 800 MG tablet; Take 1 tablet (800 mg total) by mouth every 8 (eight) hours as needed.  Dispense: 30 tablet; Refill: 5  3. Vaginal discharge Rx: - Cervicovaginal ancillary only( [place])  4. Candida albicans infection Rx - fluconazole (DIFLUCAN) 150 MG tablet; Take 1 tablet (150 mg total) by mouth once for 1 dose.  Dispense: 1 tablet; Refill: 2  5. Encounter for other general counseling and advice on contraception - wants OCP's  73. Encounter  for initial prescription of contraceptive pills Rx: - Drospirenone (SLYND) 4 MG TABS; Take 1 tablet by mouth daily before breakfast.  Dispense: 28 tablet; Refill: 11  7. Tobacco dependence due to cigarettes Rx: - varenicline (CHANTIX STARTING MONTH PAK) 0.5 MG X 11 & 1 MG X 42 tablet; Take one 0.5 mg tablet by mouth once daily for 3 days, then increase to one 0.5 mg tablet twice daily for 4 days, then increase to one 1 mg tablet twice daily.  Dispense: 53 tablet; Refill: 0 - varenicline (CHANTIX CONTINUING MONTH PAK) 1 MG tablet; Take 1 tablet (1 mg total) by mouth 2 (two) times daily.  Dispense: 60 tablet; Refill: 1    Plan    Follow up in 2 weeks for pelvic exam  Orders Placed This Encounter  Procedures  . US PELVIC COMPLETE WITH TRANSVAGINAL    Standing Status:   Future    Standing Expiration Date:   05/15/2020    Order Specific Question:   Reason for Exam (SYMPTOM  OR DIAGNOSIS REQUIRED)    Answer:   PID, Chronic    Order Specific Question:  Preferred imaging location?    Answer:   [facility]   Meds ordered this encounter  Medications  . doxycycline (VIBRA-TABS) 100 MG tablet    Sig: Take 1 tablet (100 mg total) by mouth 2 (two) times daily for 14 days.    Dispense:  28 tablet    Refill:  0  . metroNIDAZOLE (FLAGYL) 500 MG tablet    Sig: Take 1 tablet (500 mg total) by mouth 2 (two) times daily for 14 days.    Dispense:  28 tablet    Refill:  0  . fluconazole (DIFLUCAN) 150 MG tablet    Sig: Take 1 tablet (150 mg total) by mouth once for 1 dose.    Dispense:  1 tablet    Refill:  2  . oxyCODONE-acetaminophen (PERCOCET/ROXICET) 5-325 MG tablet    Sig: Take 1-2 tablets by mouth every 6 (six) hours as needed for severe pain.    Dispense:  30 tablet    Refill:  0  . ibuprofen (ADVIL,MOTRIN) 800 MG tablet    Sig: Take 1 tablet (800 mg total) by mouth every 8 (eight) hours as needed.    Dispense:  30 tablet    Refill:  5  . etonogestrel-ethinyl  estradiol (NUVARING) 0.12-0.015 MG/24HR vaginal ring    Sig: Insert vaginally and leave in place for 3 consecutive weeks, then remove for 1 week.    Dispense:  1 each    Refill:  12    Shelly Bombard MD 03-16-2019

## 2019-03-16 NOTE — Progress Notes (Signed)
Pt presents for pelvic exam to check abscesses.

## 2019-03-17 LAB — CERVICOVAGINAL ANCILLARY ONLY
Bacterial vaginitis: NEGATIVE
Candida vaginitis: POSITIVE — AB
Chlamydia: NEGATIVE
Neisseria Gonorrhea: NEGATIVE
Trichomonas: NEGATIVE

## 2019-03-30 ENCOUNTER — Ambulatory Visit: Payer: Medicaid Other | Admitting: Obstetrics

## 2019-04-01 IMAGING — CR DG CHEST 2V
2 series · 2 of 2 positions shown · non-contrast
Comparison: 07/09/2015

CLINICAL DATA: Headache pain in the throat and chest

EXAM:
CHEST  2 VIEW

[chest pa]
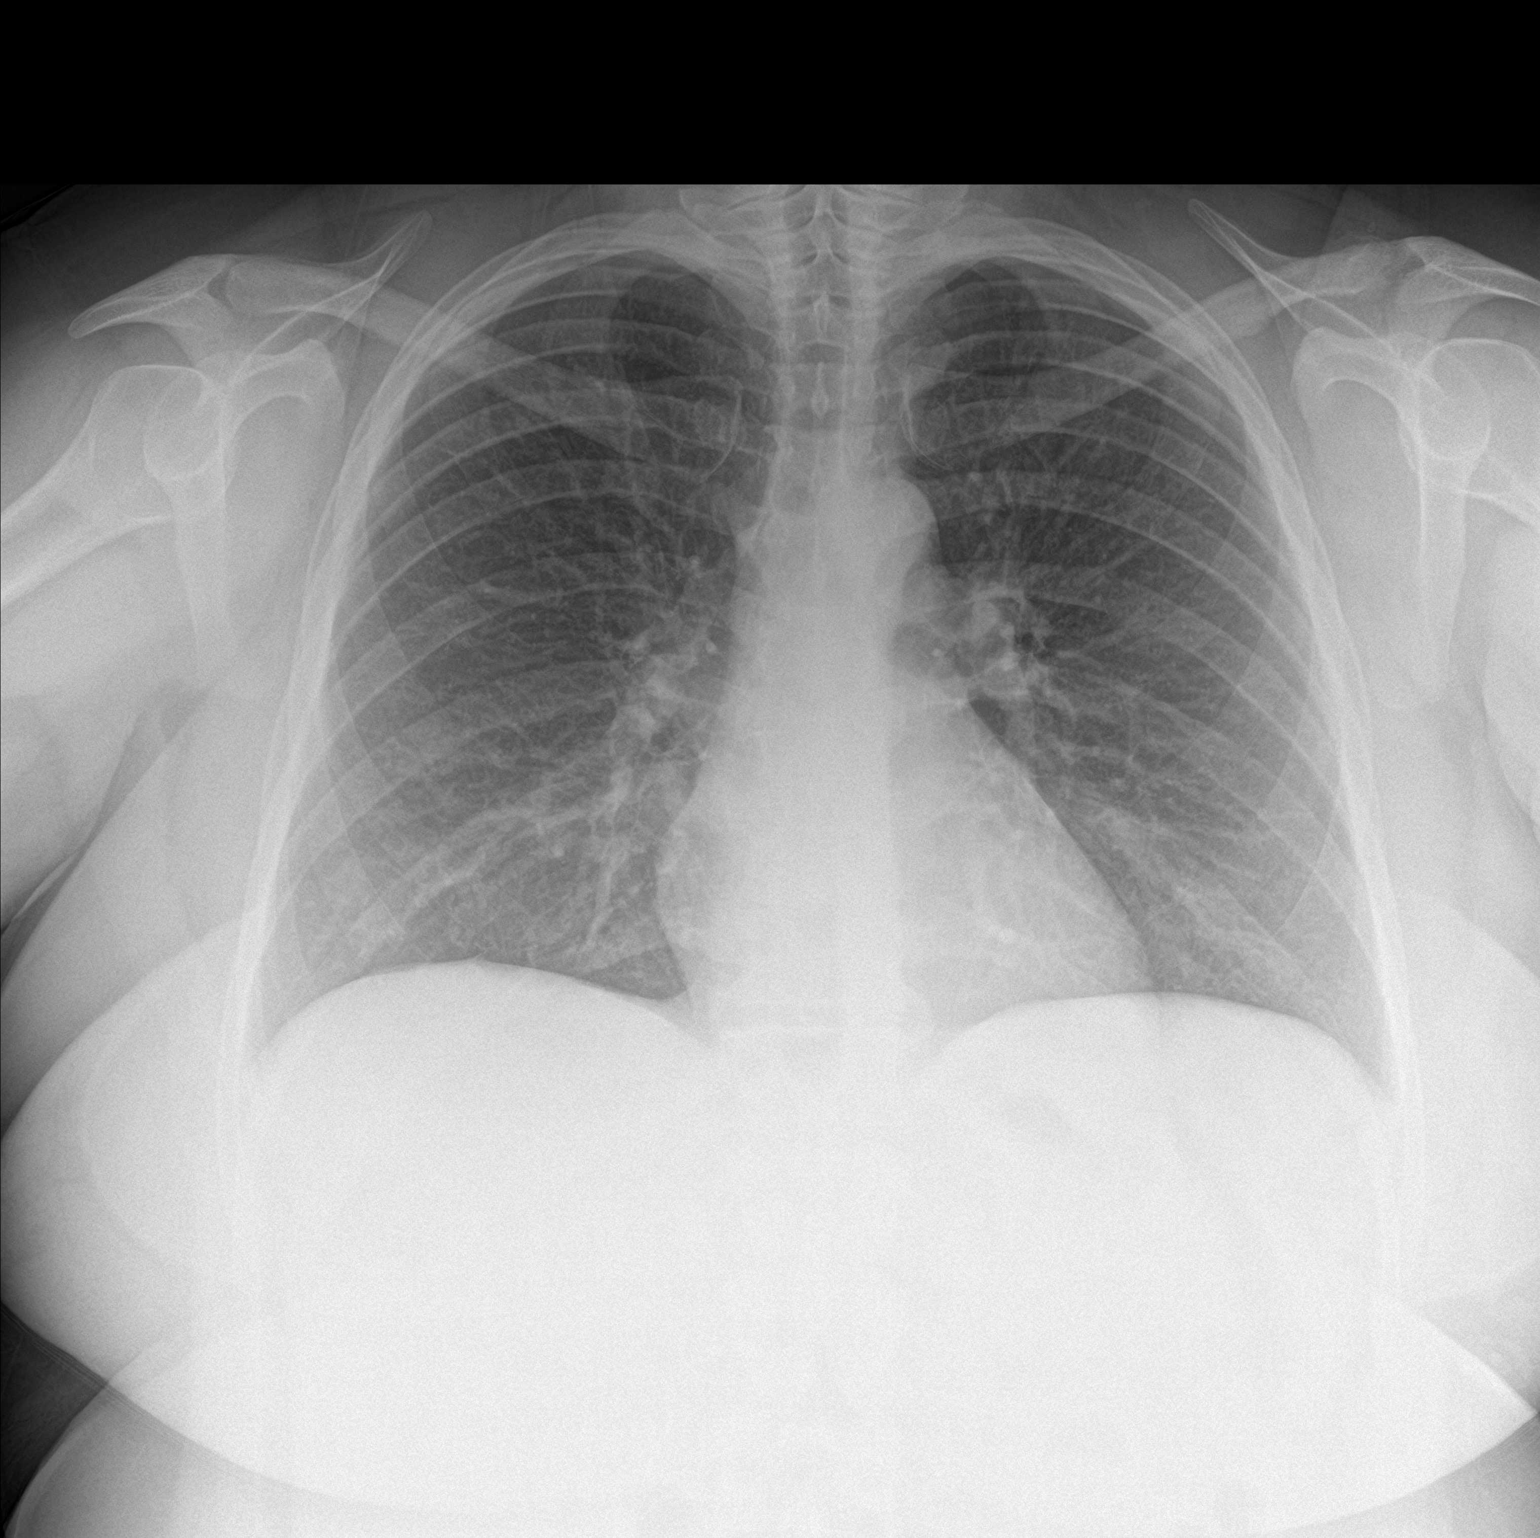

[chest lat]
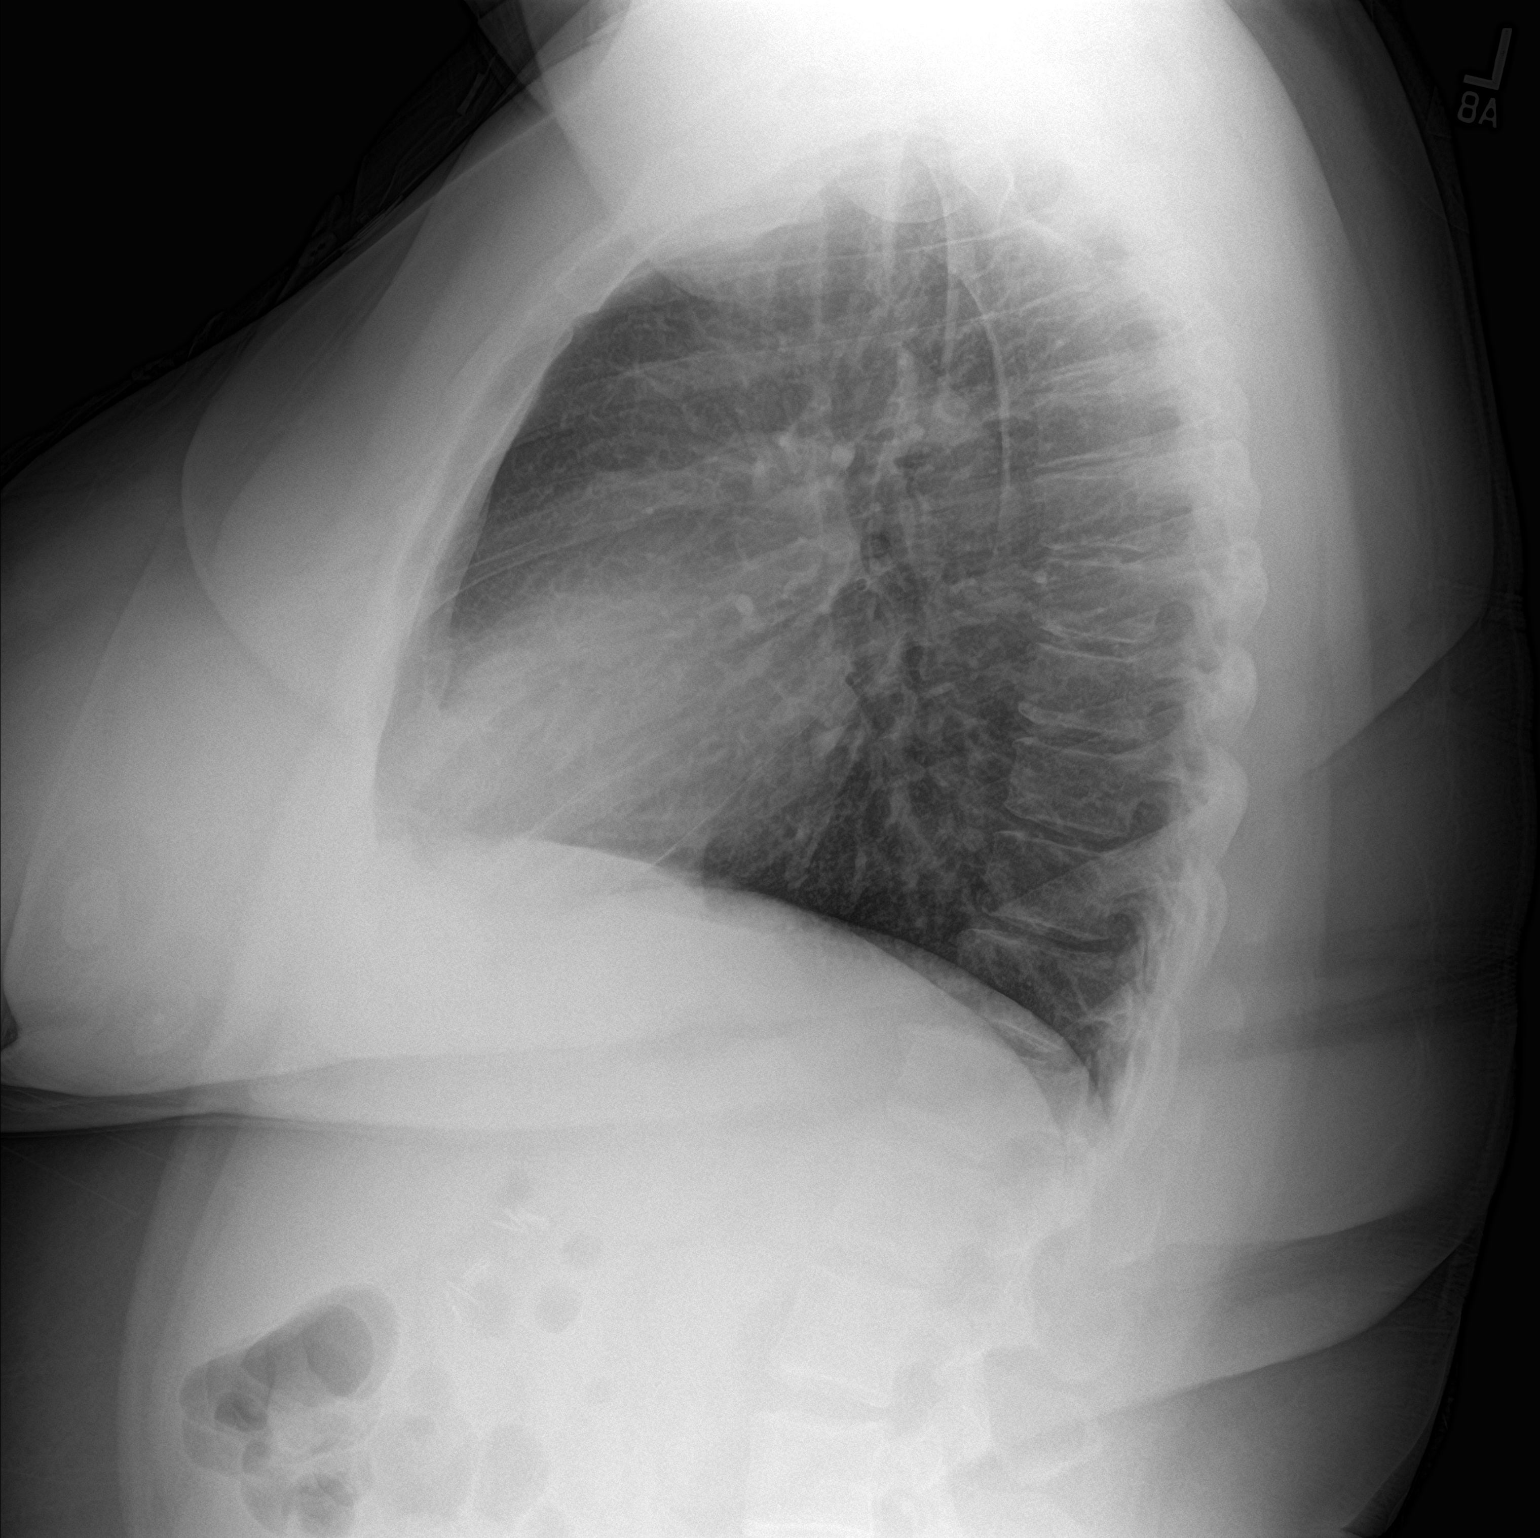

[2 of 2 positions shown; findings below may reference images not displayed]

FINDINGS: The heart size and mediastinal contours are within normal limits.
Both lungs are clear. The visualized skeletal structures are
unremarkable.
IMPRESSION: No active cardiopulmonary disease.

## 2019-04-10 ENCOUNTER — Ambulatory Visit: Payer: Medicaid Other | Admitting: Obstetrics

## 2019-04-16 ENCOUNTER — Ambulatory Visit (HOSPITAL_COMMUNITY): Admission: RE | Admit: 2019-04-16 | Payer: Medicaid Other | Source: Ambulatory Visit

## 2019-06-29 ENCOUNTER — Ambulatory Visit: Payer: Medicaid Other | Admitting: Obstetrics

## 2019-09-17 ENCOUNTER — Ambulatory Visit (HOSPITAL_COMMUNITY): Payer: Medicaid Other | Attending: Obstetrics

## 2019-11-19 ENCOUNTER — Other Ambulatory Visit: Payer: Self-pay

## 2019-11-19 ENCOUNTER — Emergency Department (HOSPITAL_COMMUNITY)
Admission: EM | Admit: 2019-11-19 | Discharge: 2019-11-19 | Disposition: A | Discharge status: Home / Self Care | Payer: Medicaid Other | Attending: Emergency Medicine | Admitting: Emergency Medicine

## 2019-11-19 ENCOUNTER — Encounter (HOSPITAL_COMMUNITY): Payer: Self-pay | Admitting: Emergency Medicine

## 2019-11-19 DIAGNOSIS — K625 Hemorrhage of anus and rectum: Secondary | ICD-10-CM | POA: Diagnosis present

## 2019-11-19 DIAGNOSIS — Z5321 Procedure and treatment not carried out due to patient leaving prior to being seen by health care provider: Secondary | ICD-10-CM | POA: Diagnosis not present

## 2019-11-19 NOTE — ED Triage Notes (Signed)
Pt states she has had rectal bleeding x 5 months and it has proogressed to clots. States she has numbness and tingling in all her limbs. Alert and oriented.

## 2019-11-19 NOTE — ED Notes (Signed)
Called for blood draw and no response.

## 2019-11-20 ENCOUNTER — Ambulatory Visit: Payer: Medicaid Other | Admitting: Obstetrics

## 2019-11-25 ENCOUNTER — Ambulatory Visit: Payer: Medicaid Other | Admitting: Obstetrics

## 2019-11-26 NOTE — Telephone Encounter (Signed)
Made in error

## 2019-12-25 ENCOUNTER — Ambulatory Visit: Payer: Medicaid Other | Admitting: Obstetrics

## 2019-12-25 ENCOUNTER — Encounter (HOSPITAL_COMMUNITY): Payer: Self-pay

## 2019-12-25 ENCOUNTER — Encounter: Payer: Self-pay | Admitting: Obstetrics

## 2019-12-25 ENCOUNTER — Other Ambulatory Visit: Payer: Self-pay

## 2019-12-25 ENCOUNTER — Emergency Department (HOSPITAL_COMMUNITY): Payer: Medicaid Other

## 2019-12-25 ENCOUNTER — Emergency Department (HOSPITAL_COMMUNITY)
Admission: EM | Admit: 2019-12-25 | Discharge: 2019-12-26 | Disposition: A | Discharge status: Other Acute Inpatient Hospital | Payer: Medicaid Other | Attending: Emergency Medicine | Admitting: Emergency Medicine

## 2019-12-25 VITALS — BP 137/85 | HR 87 | Wt 229.0 lb

## 2019-12-25 DIAGNOSIS — Z79899 Other long term (current) drug therapy: Secondary | ICD-10-CM | POA: Insufficient documentation

## 2019-12-25 DIAGNOSIS — F319 Bipolar disorder, unspecified: Secondary | ICD-10-CM | POA: Diagnosis not present

## 2019-12-25 DIAGNOSIS — R45851 Suicidal ideations: Secondary | ICD-10-CM | POA: Diagnosis not present

## 2019-12-25 DIAGNOSIS — F1220 Cannabis dependence, uncomplicated: Secondary | ICD-10-CM | POA: Diagnosis not present

## 2019-12-25 DIAGNOSIS — M79644 Pain in right finger(s): Secondary | ICD-10-CM | POA: Diagnosis not present

## 2019-12-25 DIAGNOSIS — F17210 Nicotine dependence, cigarettes, uncomplicated: Secondary | ICD-10-CM | POA: Insufficient documentation

## 2019-12-25 DIAGNOSIS — K625 Hemorrhage of anus and rectum: Secondary | ICD-10-CM | POA: Insufficient documentation

## 2019-12-25 DIAGNOSIS — N3001 Acute cystitis with hematuria: Secondary | ICD-10-CM

## 2019-12-25 DIAGNOSIS — Z20822 Contact with and (suspected) exposure to covid-19: Secondary | ICD-10-CM | POA: Diagnosis not present

## 2019-12-25 DIAGNOSIS — I10 Essential (primary) hypertension: Secondary | ICD-10-CM | POA: Diagnosis not present

## 2019-12-25 DIAGNOSIS — Z008 Encounter for other general examination: Secondary | ICD-10-CM | POA: Insufficient documentation

## 2019-12-25 LAB — POCT URINALYSIS DIPSTICK
Bilirubin, UA: NEGATIVE
Glucose, UA: NEGATIVE
Ketones, UA: NEGATIVE
Leukocytes, UA: NEGATIVE
Nitrite, UA: POSITIVE
Protein, UA: POSITIVE — AB
Spec Grav, UA: >=1.03 — AB (ref 1.010–1.025)
Urobilinogen, UA: 0.2 E.U./dL
pH, UA: 6.5 (ref 5.0–8.0)

## 2019-12-25 LAB — CBC
HCT: 36.3 % (ref 36.0–46.0)
Hemoglobin: 11.6 g/dL — ABNORMAL LOW (ref 12.0–15.0)
MCH: 26.4 pg (ref 26.0–34.0)
MCHC: 32 g/dL (ref 30.0–36.0)
MCV: 82.5 fL (ref 80.0–100.0)
Platelets: 407 10*3/uL — ABNORMAL HIGH (ref 150–400)
RBC: 4.4 MIL/uL (ref 3.87–5.11)
RDW: 14.6 % (ref 11.5–15.5)
WBC: 9.8 10*3/uL (ref 4.0–10.5)
nRBC: 0 % (ref 0.0–0.2)

## 2019-12-25 LAB — COMPREHENSIVE METABOLIC PANEL
ALT: 20 U/L (ref 0–44)
AST: 19 U/L (ref 15–41)
Albumin: 3.8 g/dL (ref 3.5–5.0)
Alkaline Phosphatase: 88 U/L (ref 38–126)
Anion gap: 10 (ref 5–15)
BUN: 9 mg/dL (ref 6–20)
CO2: 20 mmol/L — ABNORMAL LOW (ref 22–32)
Calcium: 8.5 mg/dL — ABNORMAL LOW (ref 8.9–10.3)
Chloride: 105 mmol/L (ref 98–111)
Creatinine, Ser: 0.81 mg/dL (ref 0.44–1.00)
GFR calc Af Amer: >60 mL/min (ref >60)
GFR calc non Af Amer: >60 mL/min (ref >60)
Glucose, Bld: 107 mg/dL — ABNORMAL HIGH (ref 70–99)
Potassium: 3.4 mmol/L — ABNORMAL LOW (ref 3.5–5.1)
Sodium: 135 mmol/L (ref 135–145)
Total Bilirubin: 0.1 mg/dL — ABNORMAL LOW (ref 0.3–1.2)
Total Protein: 7 g/dL (ref 6.5–8.1)

## 2019-12-25 LAB — I-STAT CHEM 8, ED
BUN: 8 mg/dL (ref 6–20)
Calcium, Ion: 1.09 mmol/L — ABNORMAL LOW (ref 1.15–1.40)
Chloride: 109 mmol/L (ref 98–111)
Creatinine, Ser: 0.7 mg/dL (ref 0.44–1.00)
Glucose, Bld: 102 mg/dL — ABNORMAL HIGH (ref 70–99)
HCT: 35 % — ABNORMAL LOW (ref 36.0–46.0)
Hemoglobin: 11.9 g/dL — ABNORMAL LOW (ref 12.0–15.0)
Potassium: 3.6 mmol/L (ref 3.5–5.1)
Sodium: 142 mmol/L (ref 135–145)
TCO2: 24 mmol/L (ref 22–32)

## 2019-12-25 LAB — RAPID URINE DRUG SCREEN, HOSP PERFORMED
Amphetamines: NOT DETECTED
Barbiturates: NOT DETECTED
Benzodiazepines: NOT DETECTED
Cocaine: NOT DETECTED
Opiates: NOT DETECTED
Tetrahydrocannabinol: POSITIVE — AB

## 2019-12-25 LAB — ETHANOL: Alcohol, Ethyl (B): <10 mg/dL (ref <10)

## 2019-12-25 LAB — ACETAMINOPHEN LEVEL: Acetaminophen (Tylenol), Serum: <10 ug/mL — ABNORMAL LOW (ref 10–30)

## 2019-12-25 LAB — SALICYLATE LEVEL: Salicylate Lvl: <7 mg/dL — ABNORMAL LOW (ref 7.0–30.0)

## 2019-12-25 LAB — RESPIRATORY PANEL BY RT PCR (FLU A&B, COVID)
Influenza A by PCR: NEGATIVE
Influenza B by PCR: NEGATIVE
SARS Coronavirus 2 by RT PCR: NEGATIVE

## 2019-12-25 MED ORDER — ZIPRASIDONE MESYLATE 20 MG IM SOLR
10.0000 mg | Freq: Once | INTRAMUSCULAR | Status: AC
  Administered 2019-12-26: 10 mg via INTRAMUSCULAR
  Filled 2019-12-25: qty 20

## 2019-12-25 MED ORDER — NITROFURANTOIN MONOHYD MACRO 100 MG PO CAPS: 100.0000 mg | ORAL_CAPSULE | Freq: Two times a day (BID) | ORAL | 2 refills | Status: DC

## 2019-12-25 MED ORDER — NITROFURANTOIN MONOHYD MACRO 100 MG PO CAPS: 100.0000 mg | ORAL_CAPSULE | Freq: Two times a day (BID) | ORAL | Status: DC

## 2019-12-25 MED ORDER — PANTOPRAZOLE SODIUM 40 MG PO TBEC
40.0000 mg | DELAYED_RELEASE_TABLET | Freq: Every day | ORAL | Status: DC
  Administered 2019-12-25 – 2019-12-26 (×2): 40 mg via ORAL
  Filled 2019-12-25 (×2): qty 1

## 2019-12-25 MED ORDER — QUETIAPINE FUMARATE 400 MG PO TABS: 400.0000 mg | ORAL_TABLET | Freq: Every day | ORAL | Status: DC

## 2019-12-25 MED ORDER — LAMOTRIGINE 25 MG PO TABS
50.0000 mg | ORAL_TABLET | Freq: Every day | ORAL | Status: DC
  Administered 2019-12-26: 50 mg via ORAL
  Filled 2019-12-25 (×2): qty 2

## 2019-12-25 MED ORDER — QUETIAPINE FUMARATE 50 MG PO TABS: 50.0000 mg | ORAL_TABLET | Freq: Every day | ORAL | Status: DC

## 2019-12-25 MED ORDER — QUETIAPINE FUMARATE 50 MG PO TABS
450.0000 mg | ORAL_TABLET | Freq: Every day | ORAL | Status: DC
  Administered 2019-12-25: 450 mg via ORAL
  Filled 2019-12-25: qty 1

## 2019-12-25 MED ORDER — GABAPENTIN 300 MG PO CAPS
900.0000 mg | ORAL_CAPSULE | Freq: Every morning | ORAL | Status: DC
  Administered 2019-12-26: 900 mg via ORAL
  Filled 2019-12-25: qty 3

## 2019-12-25 MED ORDER — SERTRALINE HCL 100 MG PO TABS
300.0000 mg | ORAL_TABLET | Freq: Every day | ORAL | Status: DC
  Administered 2019-12-26: 300 mg via ORAL
  Filled 2019-12-25 (×2): qty 3

## 2019-12-25 NOTE — Progress Notes (Addendum)
Patient ID: Amy Yoder, female   DOB: 1986/09/03, 34 y.o.   MRN: [id number]  Chief Complaint  Patient presents with  . Urinary Tract Infection    HPI Amy Yoder is a 34 y.o. female.  Complains of urinary frequency, dribbling and odor to urine. HPI  Past Medical History:  Diagnosis Date  . Bipolar I disorder ([place])   . Bronchitis   . Depression   . Hypertension 02/15/2019  . Marijuana use   . Symptomatic cholelithiasis s/p lap chole 03/12/2014 03/11/2014  . Tobacco use     Past Surgical History:  Procedure Laterality Date  . CHOLECYSTECTOMY N/A 03/12/2014   Procedure: LAPAROSCOPIC CHOLECYSTECTOMY WITH INTRAOPERATIVE CHOLANGIOGRAM;  Surgeon: Merrie Roof, MD;  Location: [facility];  Service: General;  Laterality: N/A;  . DENTAL SURGERY    . DILATION AND CURETTAGE OF UTERUS     x2 for EAB    Family History  Problem Relation Age of Onset  . Hypertension Mother     Social History Social History   Tobacco Use  . Smoking status: Current Every Day Smoker    Packs/day: 0.50    Types: Cigarettes  . Smokeless tobacco: Never Used  Substance Use Topics  . Alcohol use: Yes    Comment: 2x/week  . Drug use: Yes    Comment: occasional Marijuana use    Allergies  Allergen Reactions  . Penicillins Hives, Shortness Of Breath and Swelling    Boils  Has patient had a PCN reaction causing immediate rash, facial/tongue/throat swelling, SOB or lightheadedness with hypotension: yes Has patient had a PCN reaction causing severe rash involving mucus membranes or skin necrosis: no Has patient had a PCN reaction that required hospitalization: yes, visit Has patient had a PCN reaction occurring within the last 10 years: no If all of the above answers are "NO", then may proceed with Cephalosporin use.     Current Outpatient Medications  Medication Sig Dispense Refill  . albuterol (PROVENTIL HFA;VENTOLIN HFA) 108 (90 Base) MCG/ACT inhaler Inhale 1-2 puffs into the  lungs every 4 (four) hours as needed for wheezing or shortness of breath. 1 Inhaler 0  . azithromycin (ZITHROMAX) 500 MG tablet TAKE 2 TABLETS BY MOUTH ONCE FOR 1 DOSE    . Drospirenone (SLYND) 4 MG TABS Take 1 tablet by mouth daily before breakfast. 28 tablet 11  . fluconazole (DIFLUCAN) 150 MG tablet Take 1 tablet (150 mg total) by mouth every 3 (three) days. For three doses (Patient not taking: Reported on 03/16/2019) 3 tablet 3  . gabapentin (NEURONTIN) 300 MG capsule Take 900 mg by mouth every morning.     Marland Kitchen ibuprofen (ADVIL,MOTRIN) 800 MG tablet Take 1 tablet (800 mg total) by mouth 3 (three) times daily with meals as needed for headache, mild pain or cramping. 60 tablet 1  . ibuprofen (ADVIL,MOTRIN) 800 MG tablet Take 1 tablet (800 mg total) by mouth every 8 (eight) hours as needed. 30 tablet 5  . lamoTRIgine (LAMICTAL) 25 MG tablet Take 50 mg by mouth daily.    . nicotine (NICODERM CQ - DOSED IN MG/24 HOURS) 21 mg/24hr patch Place 1 patch (21 mg total) onto the skin at bedtime. (Patient not taking: Reported on 03/02/2019) 28 patch 2  . nitrofurantoin, macrocrystal-monohydrate, (MACROBID) 100 MG capsule Take 1 capsule (100 mg total) by mouth 2 (two) times daily. 1 po BID x 7days 14 capsule 2  . omeprazole (PRILOSEC) 20 MG capsule Take 1 capsule (20 mg total) by  mouth daily. 30 capsule 0  . ondansetron (ZOFRAN) 4 MG tablet Take 1 tablet (4 mg total) by mouth every 6 (six) hours as needed for nausea or vomiting. 20 tablet 2  . oxyCODONE-acetaminophen (PERCOCET) 5-325 MG tablet Take 1 tablet by mouth every 6 (six) hours as needed for severe pain. (Patient not taking: Reported on 03/02/2019) 30 tablet 0  . oxyCODONE-acetaminophen (PERCOCET/ROXICET) 5-325 MG tablet Take 1-2 tablets by mouth every 6 (six) hours as needed for severe pain. 30 tablet 0  . promethazine (PHENERGAN) 25 MG tablet Take 1 tablet (25 mg total) by mouth every 6 (six) hours as needed for nausea or vomiting. 15 tablet 0  .  QUEtiapine (SEROQUEL) 400 MG tablet Take 400 mg by mouth daily. Pt takes at bedtime with 50mg  for a total dose of 450mg   1  . QUEtiapine (SEROQUEL) 50 MG tablet Take 50 mg by mouth at bedtime. Pt takes at bedtime with 400mg  tablet for total dose of 450mg     . sertraline (ZOLOFT) 100 MG tablet Take 200 mg by mouth daily.    . varenicline (CHANTIX CONTINUING MONTH PAK) 1 MG tablet Take 1 tablet (1 mg total) by mouth 2 (two) times daily. 60 tablet 1  . varenicline (CHANTIX STARTING MONTH PAK) 0.5 MG X 11 & 1 MG X 42 tablet Take one 0.5 mg tablet by mouth once daily for 3 days, then increase to one 0.5 mg tablet twice daily for 4 days, then increase to one 1 mg tablet twice daily. 53 tablet 0   No current facility-administered medications for this visit.    Review of Systems Review of Systems Constitutional: negative for fatigue and weight loss Respiratory: negative for cough and wheezing Cardiovascular: negative for chest pain, fatigue and palpitations Gastrointestinal: negative for abdominal pain and change in bowel habits Genitourinary:negative Integument/breast: negative for nipple discharge Musculoskeletal:negative for myalgias Neurological: negative for gait problems and tremors Behavioral/Psych: negative for abusive relationship, depression Endocrine: negative for temperature intolerance      Blood pressure 137/85, pulse 87, weight 229 lb (103.9 kg).  Physical Exam Physical Exam General:   alert  Skin:   no rash or abnormalities  Lungs:   clear to auscultation bilaterally  Heart:   regular rate and rhythm, S1, S2 normal, no murmur, click, rub or gallop  Breasts:   normal without suspicious masses, skin or nipple changes or axillary nodes  Abdomen:  normal findings: no organomegaly, soft, non-tender and no hernia   50% of 15 min visit spent on counseling and coordination of care.   Data Reviewed Urinalysis:  Results for MARSHELLA, VENEZIA (MRN [id number]) as of 12/25/2019  10:58  Ref. Range 12/25/2019 10:49  Bilirubin, UA Unknown negative  Clarity, UA Unknown hazy  Color, UA Unknown amber  Glucose Latest Ref Range: Negative  Negative  Ketones, UA Unknown negative  Leukocytes,UA Latest Ref Range: Negative  Negative  Nitrite, UA Unknown positive  pH, UA Latest Ref Range: 5.0 - 8.0  6.5  Protein,UA Latest Ref Range: Negative  Positive (A)  Specific Gravity, UA Latest Ref Range: 1.010 - 1.025  >=1.030 (A)  Urobilinogen, UA Latest Ref Range: 0.2 or 1.0 E.U./dL 0.2  RBC, UA Unknown large   Assessment     1. Acute cystitis with hematuria Rx: - POCT Urinalysis Dipstick - Urine culture - nitrofurantoin, macrocrystal-monohydrate, (MACROBID) 100 MG capsule; Take 1 capsule (100 mg total) by mouth 2 (two) times daily. 1 po BID x 7days  Dispense: 14 capsule; Refill:  2    Plan    Follow up prn  Orders Placed This Encounter  Procedures  . Urine culture  . POCT Urinalysis Dipstick   Meds ordered this encounter  Medications  . DISCONTD: nitrofurantoin, macrocrystal-monohydrate, (MACROBID) 100 MG capsule    Sig: Take 1 capsule (100 mg total) by mouth 2 (two) times daily. 1 po BID x 7days    Dispense:  14 capsule    Refill:  2  . nitrofurantoin, macrocrystal-monohydrate, (MACROBID) 100 MG capsule    Sig: Take 1 capsule (100 mg total) by mouth 2 (two) times daily. 1 po BID x 7days    Dispense:  14 capsule    Refill:  2   Shelly Bombard, MD 12/25/2019 10:53 AM
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
## 2019-12-25 NOTE — ED Triage Notes (Signed)
Pt reports suicidal thoughts, pt denies ah/vh. Pt very tearful in triage stating "I just dont want to be here anymore" Pt reports hx of psychiatric issues as a child and is on multiple medications now. Pt seeing therapist outpt.

## 2019-12-25 NOTE — ED Notes (Signed)
[id number] Amy Yoder wants a call for update   She has been trying to contact her after her sister called upset making her believe "it was a mental issue"

## 2019-12-25 NOTE — [facility] Counselor (Signed)
Clinician spoke to Will, RN, to pt in a private room to complete her TTS assessment. Clinician to call the cart in 10 minutes.    Vertell Novak, [place], [facility], [facility] Triage Specialist [phone]

## 2019-12-25 NOTE — ED Notes (Addendum)
Moved temporarily to 53 for TTS consult

## 2019-12-25 NOTE — ED Provider Notes (Signed)
[facility] EMERGENCY DEPARTMENT Provider Note   CSN: [id number] Arrival date & time: 12/25/19  1826     History Chief Complaint  Patient presents with  . Suicidal    Amy Yoder is a 34 y.o. female.  34 yo F with a chief complaint of suicidal ideation.  Going on for the past few days.  She does thinks the stressors of the world is gotten to her.  Has a history of mental illness and she has been taking her medications as prescribed.  She has multiple medical ailments, has been having some dysuria and frequency which she saw her OB/GYN for this morning and was prescribed antibiotics.  She then complaining of right thumb pain that is nontraumatic.  Hurts mostly in the webspace between the thumb and the second digit.  Worse with movement and palpation.  She is also complaining of rectal bleeding which is a chronic problem for her.  She is supposed to have a colonoscopy done next week.  The history is provided by the patient.  Illness Severity:  Moderate Onset quality:  Gradual Duration:  2 days Timing:  Constant Progression:  Worsening Chronicity:  New Associated symptoms: no chest pain, no congestion, no fever, no headaches, no myalgias, no nausea, no rhinorrhea, no shortness of breath, no vomiting and no wheezing        Past Medical History:  Diagnosis Date  . Bipolar I disorder ([place])   . Bronchitis   . Depression   . Hypertension 02/15/2019  . Marijuana use   . Symptomatic cholelithiasis s/p lap chole 03/12/2014 03/11/2014  . Tobacco use     Patient Active Problem List   Diagnosis Date Noted  . Pelvic abscess in female 02/16/2019  . TOA (tubo-ovarian abscess) 02/15/2019  . Hypertension 02/15/2019  . BMI 39.0-39.9,adult 02/15/2019  . Chlamydia 02/14/2019    Past Surgical History:  Procedure Laterality Date  . CHOLECYSTECTOMY N/A 03/12/2014   Procedure: LAPAROSCOPIC CHOLECYSTECTOMY WITH INTRAOPERATIVE CHOLANGIOGRAM;  Surgeon: Merrie Roof, MD;  Location: [facility];  Service: General;  Laterality: N/A;  . DENTAL SURGERY    . DILATION AND CURETTAGE OF UTERUS     x2 for EAB     OB History    Gravida  5   Para  1   Term  1   Preterm      AB  4   Living  1     SAB  2   TAB  2   Ectopic      Multiple      Live Births  1           Family History  Problem Relation Age of Onset  . Hypertension Mother     Social History   Tobacco Use  . Smoking status: Current Every Day Smoker    Packs/day: 0.50    Types: Cigarettes  . Smokeless tobacco: Never Used  Substance Use Topics  . Alcohol use: Yes    Comment: 2x/week  . Drug use: Yes    Comment: occasional Marijuana use    Home Medications Prior to Admission medications   Medication Sig Start Date End Date Taking? Authorizing Provider  albuterol (PROVENTIL HFA;VENTOLIN HFA) 108 (90 Base) MCG/ACT inhaler Inhale 1-2 puffs into the lungs every 4 (four) hours as needed for wheezing or shortness of breath. 07/10/18  Yes [name], MD  gabapentin (NEURONTIN) 300 MG capsule Take 600-900 mg by mouth 2 (two) times daily. Pt  states she takes 900 mg in the morning and 600 mg in the afternoon   Yes [provider]  lamoTRIgine (LAMICTAL) 25 MG tablet Take 50 mg by mouth daily.   Yes [provider]  QUEtiapine (SEROQUEL) 400 MG tablet Take 400 mg by mouth daily. Pt takes at bedtime with 50mg  for a total dose of 450mg  07/09/18  Yes [provider]  QUEtiapine (SEROQUEL) 50 MG tablet Take 50 mg by mouth at bedtime. Pt takes at bedtime with 400mg  tablet for total dose of 450mg    Yes [provider]  sertraline (ZOLOFT) 100 MG tablet Take 300 mg by mouth daily.    Yes [provider]  Drospirenone (SLYND) 4 MG TABS Take 1 tablet by mouth daily before breakfast. Patient not taking: Reported on 12/25/2019 03/16/19   Shelly Bombard, MD  fluconazole (DIFLUCAN) 150 MG tablet Take 1 tablet (150 mg total) by mouth every 3  (three) days. For three doses Patient not taking: Reported on 03/16/2019 02/16/19   Anyanwu, Sallyanne Havers, MD  ibuprofen (ADVIL,MOTRIN) 800 MG tablet Take 1 tablet (800 mg total) by mouth 3 (three) times daily with meals as needed for headache, mild pain or cramping. Patient not taking: Reported on 12/25/2019 03/02/19   Shelly Bombard, MD  ibuprofen (ADVIL,MOTRIN) 800 MG tablet Take 1 tablet (800 mg total) by mouth every 8 (eight) hours as needed. Patient not taking: Reported on 12/25/2019 03/16/19   Shelly Bombard, MD  nicotine (NICODERM CQ - DOSED IN MG/24 HOURS) 21 mg/24hr patch Place 1 patch (21 mg total) onto the skin at bedtime. Patient not taking: Reported on 03/02/2019 02/16/19   Anyanwu, Sallyanne Havers, MD  nitrofurantoin, macrocrystal-monohydrate, (MACROBID) 100 MG capsule Take 1 capsule (100 mg total) by mouth 2 (two) times daily. 1 po BID x 7days Patient not taking: Reported on 12/25/2019 12/25/19   Shelly Bombard, MD  omeprazole (PRILOSEC) 20 MG capsule Take 1 capsule (20 mg total) by mouth daily. Patient not taking: Reported on 12/25/2019 10/26/13   Clayton Bibles, PA-C  ondansetron (ZOFRAN) 4 MG tablet Take 1 tablet (4 mg total) by mouth every 6 (six) hours as needed for nausea or vomiting. Patient not taking: Reported on 12/25/2019 02/16/19   Osborne Oman, MD  oxyCODONE-acetaminophen (PERCOCET) 5-325 MG tablet Take 1 tablet by mouth every 6 (six) hours as needed for severe pain. Patient not taking: Reported on 03/02/2019 02/16/19   Osborne Oman, MD  oxyCODONE-acetaminophen (PERCOCET/ROXICET) 5-325 MG tablet Take 1-2 tablets by mouth every 6 (six) hours as needed for severe pain. Patient not taking: Reported on 12/25/2019 03/16/19   Shelly Bombard, MD  promethazine (PHENERGAN) 25 MG tablet Take 1 tablet (25 mg total) by mouth every 6 (six) hours as needed for nausea or vomiting. Patient not taking: Reported on 12/25/2019 02/02/19   Milton Ferguson, MD  varenicline (CHANTIX CONTINUING MONTH PAK) 1 MG  tablet Take 1 tablet (1 mg total) by mouth 2 (two) times daily. Patient not taking: Reported on 12/25/2019 03/16/19   Shelly Bombard, MD  varenicline (CHANTIX STARTING MONTH PAK) 0.5 MG X 11 & 1 MG X 42 tablet Take one 0.5 mg tablet by mouth once daily for 3 days, then increase to one 0.5 mg tablet twice daily for 4 days, then increase to one 1 mg tablet twice daily. Patient not taking: Reported on 12/25/2019 03/16/19   Shelly Bombard, MD    Allergies    Penicillins  Review  of Systems   Review of Systems  Constitutional: Negative for chills and fever.  HENT: Negative for congestion and rhinorrhea.   Eyes: Negative for redness and visual disturbance.  Respiratory: Negative for shortness of breath and wheezing.   Cardiovascular: Negative for chest pain and palpitations.  Gastrointestinal: Positive for blood in stool. Negative for nausea and vomiting.  Genitourinary: Positive for dysuria. Negative for urgency.  Musculoskeletal: Positive for arthralgias. Negative for myalgias.  Skin: Negative for pallor and wound.  Neurological: Negative for dizziness and headaches.  Psychiatric/Behavioral: Positive for suicidal ideas.    Physical Exam Updated Vital Signs BP (!) 155/100 (BP Location: Right Arm)   Pulse 100   Temp 98.4 F (36.9 C) (Oral)   Resp 16   LMP 12/25/2019   SpO2 96%   Physical Exam Vitals and nursing note reviewed.  Constitutional:      General: She is not in acute distress.    Appearance: She is well-developed. She is not diaphoretic.  HENT:     Head: Normocephalic and atraumatic.  Eyes:     Pupils: Pupils are equal, round, and reactive to light.  Cardiovascular:     Rate and Rhythm: Normal rate and regular rhythm.     Heart sounds: No murmur. No friction rub. No gallop.   Pulmonary:     Effort: Pulmonary effort is normal.     Breath sounds: No wheezing or rales.  Abdominal:     General: There is no distension.     Palpations: Abdomen is soft.     Tenderness:  There is no abdominal tenderness.  Musculoskeletal:        General: Tenderness present.     Cervical back: Normal range of motion and neck supple.     Comments: Mild tenderness diffusely about the thumb.  Ligaments appear to be intact.  No pain at the scaphoid.  Skin:    General: Skin is warm and dry.  Neurological:     Mental Status: She is alert and oriented to person, place, and time.  Psychiatric:        Behavior: Behavior normal.     ED Results / Procedures / Treatments   Labs (all labs ordered are listed, but only abnormal results are displayed) Labs Reviewed  COMPREHENSIVE METABOLIC PANEL - Abnormal; Notable for the following components:      Result Value   Potassium 3.4 (*)    CO2 20 (*)    Glucose, Bld 107 (*)    Calcium 8.5 (*)    Total Bilirubin 0.1 (*)    All other components within normal limits  SALICYLATE LEVEL - Abnormal; Notable for the following components:   Salicylate Lvl [id number] (*)    All other components within normal limits  ACETAMINOPHEN LEVEL - Abnormal; Notable for the following components:   Acetaminophen (Tylenol), Serum <10 (*)    All other components within normal limits  CBC - Abnormal; Notable for the following components:   Hemoglobin 11.6 (*)    Platelets 407 (*)    All other components within normal limits  RAPID URINE DRUG SCREEN, HOSP PERFORMED - Abnormal; Notable for the following components:   Tetrahydrocannabinol POSITIVE (*)    All other components within normal limits  I-STAT CHEM 8, ED - Abnormal; Notable for the following components:   Glucose, Bld 102 (*)    Calcium, Ion 1.09 (*)    Hemoglobin 11.9 (*)    HCT 35.0 (*)    All other components within normal limits  RESPIRATORY PANEL BY RT PCR (FLU A&B, COVID)  ETHANOL  I-STAT BETA HCG BLOOD, ED ([facility], [facility], [facility] ONLY)    EKG None  Radiology DG Finger Thumb Right  Result Date: 12/25/2019 CLINICAL DATA:  Right thumb pain. EXAM: RIGHT THUMB 2+V COMPARISON:  None. FINDINGS: There  is no evidence of fracture or dislocation. There is no evidence of arthropathy or other focal bone abnormality. Soft tissues are unremarkable. IMPRESSION: Negative. Electronically Signed   By: Virgina Norfolk M.D.   On: 12/25/2019 20:25    Procedures Procedures (including critical care time)  Medications Ordered in ED Medications  gabapentin (NEURONTIN) capsule 900 mg (has no administration in time range)  lamoTRIgine (LAMICTAL) tablet 50 mg (has no administration in time range)  pantoprazole (PROTONIX) EC tablet 40 mg (40 mg Oral Given 12/25/19 2138)  sertraline (ZOLOFT) tablet 300 mg (has no administration in time range)  QUEtiapine (SEROQUEL) tablet 450 mg (450 mg Oral Given 12/25/19 2209)    ED Course  I have reviewed the triage vital signs and the nursing notes.  Pertinent labs & imaging results that were available during my care of the patient were reviewed by me and considered in my medical decision making (see chart for details).    MDM Rules/Calculators/A&P                      34 yo F with a chief complaint of suicidal ideation.  Patient has multiple medical complaints though I feel that she is medically clear for TTS evaluation.  Hemoglobin is above her last check.  She is currently being treated for urinary tract infection and shows no signs of pyelonephritis.  Thumb pain without signs of infection will obtain a plain film to evaluate for fracture.  Plain film viewed by me without fx.  No concerning lab finding.  Psych feels that she meets inpatient criteria, working on placement.   The patients results and plan were reviewed and discussed.   Any x-rays performed were independently reviewed by myself.   Differential diagnosis were considered with the presenting HPI.  Medications  gabapentin (NEURONTIN) capsule 900 mg (has no administration in time range)  lamoTRIgine (LAMICTAL) tablet 50 mg (has no administration in time range)  pantoprazole (PROTONIX) EC tablet 40 mg  (40 mg Oral Given 12/25/19 2138)  sertraline (ZOLOFT) tablet 300 mg (has no administration in time range)  QUEtiapine (SEROQUEL) tablet 450 mg (450 mg Oral Given 12/25/19 2209)    Vitals:   12/25/19 1833  BP: (!) 155/100  Pulse: 100  Resp: 16  Temp: 98.4 F (36.9 C)  TempSrc: Oral  SpO2: 96%    Final diagnoses:  Suicidal ideation  Thumb pain, right    Admission/ observation were discussed with the admitting physician, patient and/or family and they are comfortable with the plan.   Final Clinical Impression(s) / ED Diagnoses Final diagnoses:  Suicidal ideation  Thumb pain, right    Rx / DC Orders ED Discharge Orders    None       Deno Etienne, DO 12/25/19 2304

## 2019-12-25 NOTE — [facility] Counselor (Signed)
Clinician called to speak to pt's nurse (to set up TTS cart) however clinician was informed pt's nurse got a "trauma" and to call back in a few minutes.    Vertell Novak, [place], [facility], [facility] Triage Specialist [phone]

## 2019-12-25 NOTE — ED Notes (Signed)
Patient transported to X-ray 

## 2019-12-26 ENCOUNTER — Inpatient Hospital Stay (HOSPITAL_COMMUNITY)
Admission: AD | Admit: 2019-12-26 | Discharge: 2019-12-28 | DRG: 885 | Disposition: A | Discharge status: Home / Self Care | Payer: Medicaid Other | Source: Intra-hospital | Attending: Psychiatry | Admitting: Psychiatry

## 2019-12-26 DIAGNOSIS — F3181 Bipolar II disorder: Principal | ICD-10-CM | POA: Diagnosis present

## 2019-12-26 DIAGNOSIS — R45851 Suicidal ideations: Secondary | ICD-10-CM | POA: Diagnosis present

## 2019-12-26 DIAGNOSIS — Z79899 Other long term (current) drug therapy: Secondary | ICD-10-CM | POA: Diagnosis not present

## 2019-12-26 DIAGNOSIS — F17210 Nicotine dependence, cigarettes, uncomplicated: Secondary | ICD-10-CM | POA: Diagnosis present

## 2019-12-26 DIAGNOSIS — Z8249 Family history of ischemic heart disease and other diseases of the circulatory system: Secondary | ICD-10-CM | POA: Diagnosis not present

## 2019-12-26 DIAGNOSIS — F332 Major depressive disorder, recurrent severe without psychotic features: Secondary | ICD-10-CM | POA: Diagnosis present

## 2019-12-26 DIAGNOSIS — F1290 Cannabis use, unspecified, uncomplicated: Secondary | ICD-10-CM | POA: Diagnosis present

## 2019-12-26 DIAGNOSIS — M79644 Pain in right finger(s): Secondary | ICD-10-CM | POA: Diagnosis not present

## 2019-12-26 DIAGNOSIS — Z880 Allergy status to penicillin: Secondary | ICD-10-CM

## 2019-12-26 DIAGNOSIS — F909 Attention-deficit hyperactivity disorder, unspecified type: Secondary | ICD-10-CM | POA: Diagnosis present

## 2019-12-26 DIAGNOSIS — Z590 Homelessness: Secondary | ICD-10-CM

## 2019-12-26 DIAGNOSIS — K219 Gastro-esophageal reflux disease without esophagitis: Secondary | ICD-10-CM | POA: Diagnosis present

## 2019-12-26 MED ORDER — STERILE WATER FOR INJECTION IJ SOLN
INTRAMUSCULAR | Status: AC
  Administered 2019-12-26: 1.2 mL
  Filled 2019-12-26: qty 10

## 2019-12-26 MED ORDER — PANTOPRAZOLE SODIUM 40 MG PO TBEC
40.0000 mg | DELAYED_RELEASE_TABLET | Freq: Every day | ORAL | Status: DC
  Administered 2019-12-27 – 2019-12-28 (×2): 40 mg via ORAL
  Filled 2019-12-26 (×3): qty 1

## 2019-12-26 MED ORDER — NICOTINE 7 MG/24HR TD PT24
7.0000 mg | MEDICATED_PATCH | Freq: Every day | TRANSDERMAL | Status: DC
  Administered 2019-12-27 – 2019-12-28 (×2): 7 mg via TRANSDERMAL
  Filled 2019-12-26 (×3): qty 1

## 2019-12-26 MED ORDER — LAMOTRIGINE 25 MG PO TABS
50.0000 mg | ORAL_TABLET | Freq: Every day | ORAL | Status: DC
  Administered 2019-12-27: 50 mg via ORAL
  Filled 2019-12-26 (×3): qty 2

## 2019-12-26 MED ORDER — GABAPENTIN 300 MG PO CAPS
900.0000 mg | ORAL_CAPSULE | Freq: Every morning | ORAL | Status: DC
  Administered 2019-12-27: 900 mg via ORAL
  Filled 2019-12-26 (×3): qty 3

## 2019-12-26 MED ORDER — SERTRALINE HCL 100 MG PO TABS
300.0000 mg | ORAL_TABLET | Freq: Every day | ORAL | Status: DC
  Administered 2019-12-27: 300 mg via ORAL
  Filled 2019-12-26 (×3): qty 3

## 2019-12-26 MED ORDER — QUETIAPINE FUMARATE 50 MG PO TABS
450.0000 mg | ORAL_TABLET | Freq: Every day | ORAL | Status: DC
  Administered 2019-12-26 – 2019-12-27 (×2): 450 mg via ORAL
  Filled 2019-12-26 (×4): qty 1

## 2019-12-26 MED ORDER — NICOTINE 7 MG/24HR TD PT24
7.0000 mg | MEDICATED_PATCH | Freq: Every day | TRANSDERMAL | Status: DC
  Administered 2019-12-26: 7 mg via TRANSDERMAL

## 2019-12-26 MED ORDER — NICOTINE 7 MG/24HR TD PT24
7.0000 mg | MEDICATED_PATCH | Freq: Once | TRANSDERMAL | Status: DC
  Administered 2019-12-26: 7 mg via TRANSDERMAL
  Filled 2019-12-26 (×2): qty 1

## 2019-12-26 NOTE — ED Notes (Signed)
Attempted to call report - RN to return call.  

## 2019-12-26 NOTE — ED Notes (Signed)
Breakfast ordered 

## 2019-12-26 NOTE — ED Notes (Signed)
Arrived to Rm 50 via stretcher. Pt noted to be sleeping. Respirations even, unlabored. Sitter w/pt. Breakfast tray delivered.

## 2019-12-26 NOTE — Progress Notes (Signed)
Orthopedic Tech Progress Note Patient Details:  Amy Yoder 19-Apr-1986 [id number]  Ortho Devices Type of Ortho Device: Thumb velcro splint Ortho Device/Splint Location: rue Ortho Device/Splint Interventions: Ordered, Application, Adjustment   Post Interventions Patient Tolerated: Well Instructions Provided: Care of device, Adjustment of device   Karolee Stamps 12/26/2019, 5:24 AM

## 2019-12-26 NOTE — ED Notes (Signed)
Found pt's belongings, put in locker #3, in purple zone.

## 2019-12-26 NOTE — ED Notes (Addendum)
Pt changed into blue paper scrubs, pt given boots to put on. Pt removed cell phone from bag - placed in her pants. Pt placed back into belongings bag as requested. Pt noted to be wearing watch to left wrist and right thumb spica on right. ALL belongings - 2 labeled belongings bags - Safe Transport - Pt aware.

## 2019-12-26 NOTE — Progress Notes (Addendum)
Per Drake Leach, [facility], pt has been accepted to [facility] bed 302-02. Accepting provider is Otila Kluver Take NP, Attending provider is Dr. Neita Garnet MD. Patient can arrive between 4-5:00PM today, 1/9. Number for report is [phone]. CSW contacted Eber Hong RN to provide disposition information.   Maxie Better, MSW, LCSW Clinical Social Worker 12/26/2019 11:36 AM

## 2019-12-26 NOTE — Progress Notes (Signed)
[facility] Notes:  (Nursing/MHT/Case Management/Adjunct)  Date:  12/26/2019  Time:  2030 Type of Therapy:  wrap up group  Participation Level:  Active  Participation Quality:  Appropriate, Attentive, Sharing and Supportive  Affect:  Anxious  Cognitive:  Appropriate  Insight:  Improving  Engagement in Group:  Engaged  Modes of Intervention:  Clarification, Education and Support  Summary of Progress/Problems: Pt shared that today she spoke to ended on good terms with many of her family members and one in particular that she had a falling out with.  Pt plans on working on her temper when she leaves the hospital knowing that she can just walk away and everything doesn't need a response. Pt is grateful for her family, her 27 year old child, and her life.   Winfield Rast S 12/26/2019, 9:08 PM

## 2019-12-26 NOTE — ED Notes (Signed)
Pt awake now. Eating breakfast. TTS being performed.

## 2019-12-26 NOTE — ED Notes (Signed)
Pt signed consent form for [facility] - Copy faxed to [facility] - Copy sent to Medical Records - Original placed in folder for [facility].

## 2019-12-26 NOTE — BH Assessment (Signed)
[facility] Assessment Progress Note This Probation officer spoke with patient this date to assess current mental health status. Patient presents with depressed affect and continues to voice immediate plan to self harm to include "jumping off a bridge off [address] Case was staffed with Marcello Moores NP who recommended a continued inpatient admission to assist with stabilization as appropriate bed placement is investigated.

## 2019-12-26 NOTE — ED Notes (Signed)
Pt voiced agreement and understanding - accepted to [facility] - may arrive around 1700 - Pt calling family member to advise.

## 2019-12-27 DIAGNOSIS — F3181 Bipolar II disorder: Principal | ICD-10-CM

## 2019-12-27 DIAGNOSIS — F332 Major depressive disorder, recurrent severe without psychotic features: Secondary | ICD-10-CM | POA: Diagnosis present

## 2019-12-27 MED ORDER — HYDROXYZINE HCL 25 MG PO TABS
25.0000 mg | ORAL_TABLET | Freq: Three times a day (TID) | ORAL | Status: DC | PRN
  Administered 2019-12-27: 25 mg via ORAL
  Filled 2019-12-27: qty 1

## 2019-12-27 MED ORDER — MAGNESIUM HYDROXIDE 400 MG/5ML PO SUSP: 30.0000 mL | Freq: Every day | ORAL | Status: DC | PRN

## 2019-12-27 MED ORDER — SERTRALINE HCL 100 MG PO TABS
200.0000 mg | ORAL_TABLET | Freq: Every day | ORAL | Status: DC
  Administered 2019-12-28: 200 mg via ORAL
  Filled 2019-12-27 (×2): qty 2

## 2019-12-27 MED ORDER — IBUPROFEN 600 MG PO TABS
600.0000 mg | ORAL_TABLET | Freq: Four times a day (QID) | ORAL | Status: DC | PRN
  Administered 2019-12-27: 600 mg via ORAL
  Filled 2019-12-27: qty 1

## 2019-12-27 MED ORDER — ACETAMINOPHEN 325 MG PO TABS: 650.0000 mg | ORAL_TABLET | Freq: Four times a day (QID) | ORAL | Status: DC | PRN

## 2019-12-27 MED ORDER — NITROFURANTOIN MONOHYD MACRO 100 MG PO CAPS
100.0000 mg | ORAL_CAPSULE | Freq: Two times a day (BID) | ORAL | Status: DC
  Administered 2019-12-27 – 2019-12-28 (×2): 100 mg via ORAL
  Filled 2019-12-27 (×5): qty 1

## 2019-12-27 MED ORDER — GABAPENTIN 300 MG PO CAPS
600.0000 mg | ORAL_CAPSULE | Freq: Two times a day (BID) | ORAL | Status: DC
  Administered 2019-12-27 – 2019-12-28 (×2): 600 mg via ORAL
  Filled 2019-12-27 (×4): qty 2

## 2019-12-27 MED ORDER — ALUM & MAG HYDROXIDE-SIMETH 200-200-20 MG/5ML PO SUSP: 30.0000 mL | ORAL | Status: DC | PRN

## 2019-12-27 NOTE — H&P (Addendum)
Psychiatric Admission Assessment Adult  Patient Identification: Amy Yoder MRN:  [id number] Date of Evaluation:  12/27/2019 Chief Complaint:  " I am feeling a lot better now, I am not depressed" Principal Diagnosis: Adjustment Disorder. Suicidal Ideations Diagnosis:  Active Problems:   MDD (major depressive disorder), recurrent episode, severe (HCC)  History of Present Illness: 34 year old female.  Presented to ED on 1/8, reporting suicidal ideations,  Endorsing thoughts that she  "did not want to be here anymore" .  She reported thoughts of driving her car off a bridge.  She reported significant contributing stressors, mainly homelessness, as well as mother's death, who passed away in 13-Mar-2018.  Patient states that on day of admission she had "slept in my car", had had an argument with her child's father, and explains " I was having a really bad day".  Today patient states "I am feeling a lot better", and states "I think I just had a bad day, that's all".  Currently she denies any suicidal ideations and presents future oriented, hoping for discharge soon so she can return to work.  She  also reports she found out that she will be able to go live with her grandmother following discharge.  States that this has been a major source of relief for her.   Associated Signs/Symptoms: Depression Symptoms:  Currently denies anhedonia, denies changes in sleep, appetite or energy level (Hypo) Manic Symptoms: none noted or reported  Anxiety Symptoms:  Reports some anxiety leading to admission related to homelessness. Psychotic Symptoms:  Denies hallucinations, no delusions are expressed , does not appear internally preoccupied  PTSD Symptoms: Denies  Total Time spent with patient: 45 minutes  Past Psychiatric History: History of a psychiatric admission as a teenager.  States was diagnosed with "ADHD".  Reports she has been diagnosed with ADHD and with bipolar disorder in the past.  She describes a history  of depression, mainly in the context of stressors such as homelessness or loss of loved ones.  Endorses brief mood swings but denies any clear history of hypomania or mania.  Describes past history of intermittent explosiveness/angry outbursts, which have tended to improve as she ages.  She denies any history of suicide attempts or self-injurious behaviors.  Denies history of psychosis.  Denies history of PTSD.  She reports that she has been on a combination of Seroquel, Zoloft, Neurontin for years.  States "I feel stable on those medications, they help".  Is the patient at risk to self? Yes.    Has the patient been a risk to self in the past 6 months? No.  Has the patient been a risk to self within the distant past? No.  Is the patient a risk to others? No.  Has the patient been a risk to others in the past 6 months? No.  Has the patient been a risk to others within the distant past? No.   Prior Inpatient Therapy:  As above Prior Outpatient Therapy:    Alcohol Screening:   Substance Abuse History in the last 12 months: denies alcohol abuse, endorses using cannabis use regularly. Denies other drug abuse  Consequences of Substance Abuse: Denies  Previous Psychotropic Medications: Seroquel 450 mg nightly, Zoloft 300 mg daily, Neurontin 900 mg every morning and 600 mg nightly.  As per home medication list also on lamotrigine 50 mg daily, patient not currently taking.  States she is tolerating these medications well. Psychological Evaluations:  No Past Medical History: Denies medical illnesses.  She is allergic to  penicillin.  Smokes one half PPD. Past Medical History:  Diagnosis Date  . Bipolar I disorder ([place])   . Bronchitis   . Depression   . Hypertension 02/15/2019  . Marijuana use   . Symptomatic cholelithiasis s/p lap chole 03/12/2014 03/11/2014  . Tobacco use     Past Surgical History:  Procedure Laterality Date  . CHOLECYSTECTOMY N/A 03/12/2014   Procedure: LAPAROSCOPIC CHOLECYSTECTOMY  WITH INTRAOPERATIVE CHOLANGIOGRAM;  Surgeon: Merrie Roof, MD;  Location: [facility];  Service: General;  Laterality: N/A;  . DENTAL SURGERY    . DILATION AND CURETTAGE OF UTERUS     x2 for EAB   Family History: Mother passed away in 03-23-18.  Father alive.  Limited contact with father.  Has 4 sisters and 2 brothers. Family History  Problem Relation Age of Onset  . Hypertension Mother    Family Psychiatric  History: Reports history of alcohol use disorder in family (mother, sister).  No suicides in family. Tobacco Screening:  Smokes half a pack per day Social History: 7, single, has a 25 year old daughter who lives with the father.  Reports she has a strained relationship with the child's father.  Reports she has been homeless and intermittently living in her car in hotels.  She is currently employed at a [employer].  Denies legal issues Social History   Substance and Sexual Activity  Alcohol Use Yes   Comment: 2x/week     Social History   Substance and Sexual Activity  Drug Use Yes   Comment: occasional Marijuana use    Additional Social History: Marital status: Single Does patient have children?: Yes How many children?: 1 How is patient's relationship with their children?: 10yo daughter, good relationship.  Child's father wants pt to come back and stay there, but she does not want to.  Allergies:   Allergies  Allergen Reactions  . Penicillins Hives, Shortness Of Breath and Swelling    Boils  Has patient had a PCN reaction causing immediate rash, facial/tongue/throat swelling, SOB or lightheadedness with hypotension: yes Has patient had a PCN reaction causing severe rash involving mucus membranes or skin necrosis: no Has patient had a PCN reaction that required hospitalization: yes, visit Has patient had a PCN reaction occurring within the last 10 years: no If all of the above answers are "NO", then may proceed with Cephalosporin use.    Lab Results:  Results for orders  placed or performed during the hospital encounter of 12/25/19 (from the past 48 hour(s))  Comprehensive metabolic panel     Status: Abnormal   Collection Time: 12/25/19  6:51 PM  Result Value Ref Range   Sodium 135 135 - 145 mmol/L   Potassium 3.4 (L) 3.5 - 5.1 mmol/L   Chloride 105 98 - 111 mmol/L   CO2 20 (L) 22 - 32 mmol/L   Glucose, Bld 107 (H) 70 - 99 mg/dL   BUN 9 6 - 20 mg/dL   Creatinine, Ser 0.81 0.44 - 1.00 mg/dL   Calcium 8.5 (L) 8.9 - 10.3 mg/dL   Total Protein 7.0 6.5 - 8.1 g/dL   Albumin 3.8 3.5 - 5.0 g/dL   AST 19 15 - 41 U/L   ALT 20 0 - 44 U/L   Alkaline Phosphatase 88 38 - 126 U/L   Total Bilirubin 0.1 (L) 0.3 - 1.2 mg/dL   GFR calc non Af Amer >60 >60 mL/min   GFR calc Af Amer >60 >60 mL/min   Anion gap 10 5 -  15    Comment: Performed at [facility], [place] [address] [place], [place] [address]  Ethanol     Status: None   Collection Time: 12/25/19  6:51 PM  Result Value Ref Range   Alcohol, Ethyl (B) <10 <10 mg/dL    Comment: (NOTE) Lowest detectable limit for serum alcohol is 10 mg/dL. For medical purposes only. Performed at [facility], [place] [address], [place], [place] [id number]   Salicylate level     Status: Abnormal   Collection Time: 12/25/19  6:51 PM  Result Value Ref Range   Salicylate Lvl [id number] (L) 7.0 - 30.0 mg/dL    Comment: Performed at [facility] [address] [place], [place] [address]  Acetaminophen level     Status: Abnormal   Collection Time: 12/25/19  6:51 PM  Result Value Ref Range   Acetaminophen (Tylenol), Serum <10 (L) 10 - 30 ug/mL    Comment: (NOTE) Therapeutic concentrations vary significantly. A range of 10-30 ug/mL  may be an effective concentration for many patients. However, some  are best treated at concentrations outside of this range. Acetaminophen concentrations >150 ug/mL at 4 hours after ingestion  and >50 ug/mL at 12 hours after ingestion are often associated with  toxic reactions. Performed at  [facility], [place] [address], [place], [place] [address]   cbc     Status: Abnormal   Collection Time: 12/25/19  6:51 PM  Result Value Ref Range   WBC 9.8 4.0 - 10.5 K/uL   RBC 4.40 3.87 - 5.11 MIL/uL   Hemoglobin 11.6 (L) 12.0 - 15.0 g/dL   HCT 36.3 36.0 - 46.0 %   MCV 82.5 80.0 - 100.0 fL   MCH 26.4 26.0 - 34.0 pg   MCHC 32.0 30.0 - 36.0 g/dL   RDW 14.6 11.5 - 15.5 %   Platelets 407 (H) 150 - 400 K/uL   nRBC 0.0 0.0 - 0.2 %    Comment: Performed at [facility], [place] [address] [place],  [address]  I-stat chem 8, ed     Status: Abnormal   Collection Time: 12/25/19  6:58 PM  Result Value Ref Range   Sodium 142 135 - 145 mmol/L   Potassium 3.6 3.5 - 5.1 mmol/L   Chloride 109 98 - 111 mmol/L   BUN 8 6 - 20 mg/dL   Creatinine, Ser 0.70 0.44 - 1.00 mg/dL   Glucose, Bld 102 (H) 70 - 99 mg/dL   Calcium, Ion 1.09 (L) 1.15 - 1.40 mmol/L   TCO2 24 22 - 32 mmol/L   Hemoglobin 11.9 (L) 12.0 - 15.0 g/dL   HCT 35.0 (L) 36.0 - 46.0 %  Rapid urine drug screen (hospital performed)     Status: Abnormal   Collection Time: 12/25/19  9:16 PM  Result Value Ref Range   Opiates NONE DETECTED NONE DETECTED   Cocaine NONE DETECTED NONE DETECTED   Benzodiazepines NONE DETECTED NONE DETECTED   Amphetamines NONE DETECTED NONE DETECTED   Tetrahydrocannabinol POSITIVE (A) NONE DETECTED   Barbiturates NONE DETECTED NONE DETECTED    Comment: (NOTE) DRUG SCREEN FOR MEDICAL PURPOSES ONLY.  IF CONFIRMATION IS NEEDED FOR ANY PURPOSE, NOTIFY LAB WITHIN 5 DAYS. LOWEST DETECTABLE LIMITS FOR URINE DRUG SCREEN Drug Class                     Cutoff (ng/mL) Amphetamine and metabolites    1000 Barbiturate and metabolites  200 Benzodiazepine                 [id number] Tricyclics and metabolites     300 Opiates and metabolites        300 Cocaine and metabolites        300 THC                            50 Performed at [facility], [place] [address], [place], [place] [address]    Respiratory Panel by RT PCR (Flu A&B, Covid) - Nasopharyngeal Swab     Status: None   Collection Time: 12/25/19  9:32 PM   Specimen: Nasopharyngeal Swab  Result Value Ref Range   SARS Coronavirus 2 by RT PCR NEGATIVE NEGATIVE    Comment: (NOTE) SARS-CoV-2 target nucleic acids are NOT DETECTED. The SARS-CoV-2 RNA is generally detectable in upper respiratoy specimens during the acute phase of infection. The lowest concentration of SARS-CoV-2 viral copies this assay can detect is 131 copies/mL. A negative result does not preclude SARS-Cov-2 infection and should not be used as the sole basis for treatment or other patient management decisions. A negative result may occur with  improper specimen collection/handling, submission of specimen other than nasopharyngeal swab, presence of viral mutation(s) within the areas targeted by this assay, and inadequate number of viral copies (<131 copies/mL). A negative result must be combined with clinical observations, patient history, and epidemiological information. The expected result is Negative. Fact Sheet for Patients:  PinkCheek.be Fact Sheet for Healthcare Providers:  GravelBags.it This test is not yet ap proved or cleared by the [place] FDA and  has been authorized for detection and/or diagnosis of SARS-CoV-2 by FDA under an Emergency Use Authorization (EUA). This EUA will remain  in effect (meaning this test can be used) for the duration of the COVID-19 declaration under Section 564(b)(1) of the Act, 21 U.S.C. section 360bbb-3(b)(1), unless the authorization is terminated or revoked sooner.    Influenza A by PCR NEGATIVE NEGATIVE   Influenza B by PCR NEGATIVE NEGATIVE    Comment: (NOTE) The Xpert Xpress SARS-CoV-2/FLU/RSV assay is intended as an aid in  the diagnosis of influenza from Nasopharyngeal swab specimens and  should not be used as a sole basis for treatment. Nasal  washings and  aspirates are unacceptable for Xpert Xpress SARS-CoV-2/FLU/RSV  testing. Fact Sheet for Patients: PinkCheek.be Fact Sheet for Healthcare Providers: GravelBags.it This test is not yet approved or cleared by the [place] FDA and  has been authorized for detection and/or diagnosis of SARS-CoV-2 by  FDA under an Emergency Use Authorization (EUA). This EUA will remain  in effect (meaning this test can be used) for the duration of the  Covid-19 declaration under Section 564(b)(1) of the Act, 21  U.S.C. section 360bbb-3(b)(1), unless the authorization is  terminated or revoked. Performed at [facility], [place] [address], [place], [place] [address]     Blood Alcohol level:  Lab Results  Component Value Date   ETH <10 [id number]    Metabolic Disorder Labs:  Lab Results  Component Value Date   HGBA1C 5.5 02/15/2019   MPG 111.15 02/15/2019   No results found for: PROLACTIN No results found for: CHOL, TRIG, HDL, CHOLHDL, VLDL, LDLCALC  Current Medications: Current Facility-Administered Medications  Medication Dose Route Frequency Provider Last Rate Last Admin  . acetaminophen (TYLENOL) tablet 650 mg  650 mg Oral Q6H PRN Emmaline Kluver,  FNP      . alum & mag hydroxide-simeth (MAALOX/MYLANTA) 200-200-20 MG/5ML suspension 30 mL  30 mL Oral Q4H PRN Emmaline Kluver, FNP      . gabapentin (NEURONTIN) capsule 900 mg  900 mg Oral q morning - 10a Emmaline Kluver, FNP   900 mg at 12/27/19 [id number]  . hydrOXYzine (ATARAX/VISTARIL) tablet 25 mg  25 mg Oral TID PRN Emmaline Kluver, FNP   25 mg at 12/27/19 1006  . ibuprofen (ADVIL) tablet 600 mg  600 mg Oral Q6H PRN Lindon Romp A, NP   600 mg at 12/27/19 0631  . lamoTRIgine (LAMICTAL) tablet 50 mg  50 mg Oral Daily Emmaline Kluver, FNP   50 mg at 12/27/19 0817  . magnesium hydroxide (MILK OF MAGNESIA) suspension 30 mL  30 mL Oral Daily PRN Emmaline Kluver, FNP      . nicotine (NICODERM CQ  - dosed in mg/24 hr) patch 7 mg  7 mg Transdermal Daily Emmaline Kluver, FNP   7 mg at 12/27/19 0818  . pantoprazole (PROTONIX) EC tablet 40 mg  40 mg Oral Daily Emmaline Kluver, FNP   40 mg at 12/27/19 0817  . QUEtiapine (SEROQUEL) tablet 450 mg  450 mg Oral QHS Emmaline Kluver, FNP   450 mg at 12/26/19 2127  . sertraline (ZOLOFT) tablet 300 mg  300 mg Oral Daily Emmaline Kluver, FNP   300 mg at 12/27/19 0818   PTA Medications: Medications Prior to Admission  Medication Sig Dispense Refill Last Dose  . albuterol (PROVENTIL HFA;VENTOLIN HFA) 108 (90 Base) MCG/ACT inhaler Inhale 1-2 puffs into the lungs every 4 (four) hours as needed for wheezing or shortness of breath. 1 Inhaler 0   . Drospirenone (SLYND) 4 MG TABS Take 1 tablet by mouth daily before breakfast. (Patient not taking: Reported on 12/25/2019) 28 tablet 11   . fluconazole (DIFLUCAN) 150 MG tablet Take 1 tablet (150 mg total) by mouth every 3 (three) days. For three doses (Patient not taking: Reported on 03/16/2019) 3 tablet 3   . gabapentin (NEURONTIN) 300 MG capsule Take 600-900 mg by mouth 2 (two) times daily. Pt states she takes 900 mg in the morning and 600 mg in the afternoon     . ibuprofen (ADVIL,MOTRIN) 800 MG tablet Take 1 tablet (800 mg total) by mouth 3 (three) times daily with meals as needed for headache, mild pain or cramping. (Patient not taking: Reported on 12/25/2019) 60 tablet 1   . ibuprofen (ADVIL,MOTRIN) 800 MG tablet Take 1 tablet (800 mg total) by mouth every 8 (eight) hours as needed. (Patient not taking: Reported on 12/25/2019) 30 tablet 5   . lamoTRIgine (LAMICTAL) 25 MG tablet Take 50 mg by mouth daily.     . nicotine (NICODERM CQ - DOSED IN MG/24 HOURS) 21 mg/24hr patch Place 1 patch (21 mg total) onto the skin at bedtime. (Patient not taking: Reported on 03/02/2019) 28 patch 2   . nitrofurantoin, macrocrystal-monohydrate, (MACROBID) 100 MG capsule Take 1 capsule (100 mg total) by mouth 2 (two) times daily. 1 po BID x 7days  (Patient not taking: Reported on 12/25/2019) 14 capsule 2   . omeprazole (PRILOSEC) 20 MG capsule Take 1 capsule (20 mg total) by mouth daily. (Patient not taking: Reported on 12/25/2019) 30 capsule 0   . ondansetron (ZOFRAN) 4 MG tablet Take 1 tablet (4 mg total) by mouth every 6 (six) hours as needed for nausea or vomiting. (Patient not  taking: Reported on 12/25/2019) 20 tablet 2   . oxyCODONE-acetaminophen (PERCOCET) 5-325 MG tablet Take 1 tablet by mouth every 6 (six) hours as needed for severe pain. (Patient not taking: Reported on 03/02/2019) 30 tablet 0   . oxyCODONE-acetaminophen (PERCOCET/ROXICET) 5-325 MG tablet Take 1-2 tablets by mouth every 6 (six) hours as needed for severe pain. (Patient not taking: Reported on 12/25/2019) 30 tablet 0   . promethazine (PHENERGAN) 25 MG tablet Take 1 tablet (25 mg total) by mouth every 6 (six) hours as needed for nausea or vomiting. (Patient not taking: Reported on 12/25/2019) 15 tablet 0   . QUEtiapine (SEROQUEL) 400 MG tablet Take 400 mg by mouth daily. Pt takes at bedtime with 50mg  for a total dose of 450mg   1   . QUEtiapine (SEROQUEL) 50 MG tablet Take 50 mg by mouth at bedtime. Pt takes at bedtime with 400mg  tablet for total dose of 450mg      . sertraline (ZOLOFT) 100 MG tablet Take 300 mg by mouth daily.      . varenicline (CHANTIX CONTINUING MONTH PAK) 1 MG tablet Take 1 tablet (1 mg total) by mouth 2 (two) times daily. (Patient not taking: Reported on 12/25/2019) 60 tablet 1   . varenicline (CHANTIX STARTING MONTH PAK) 0.5 MG X 11 & 1 MG X 42 tablet Take one 0.5 mg tablet by mouth once daily for 3 days, then increase to one 0.5 mg tablet twice daily for 4 days, then increase to one 1 mg tablet twice daily. (Patient not taking: Reported on 12/25/2019) 53 tablet 0     Musculoskeletal: Strength & Muscle Tone: within normal limits Gait & Station: normal Patient leans: N/A  Psychiatric Specialty Exam: Physical Exam  Review of Systems  Constitutional: Negative.    HENT: Negative.   Eyes: Negative.   Respiratory: Negative.   Cardiovascular: Negative.   Gastrointestinal: Negative.   Endocrine: Negative.   Genitourinary: Positive for dysuria.  Musculoskeletal: Negative.   Skin: Negative.   Allergic/Immunologic: Negative.   Neurological: Negative.   Hematological: Negative.   Psychiatric/Behavioral: Positive for suicidal ideas.  All other systems reviewed and are negative.   Blood pressure (!) 168/95, pulse 80, temperature 98.1 F (36.7 C), temperature source Oral, resp. rate 16, last menstrual period 12/25/2019, SpO2 100 %.There is no height or weight on file to calculate BMI.  General Appearance: Fairly Groomed  Eye Contact:  Good  Speech:  Normal Rate  Volume:  Normal  Mood:  reports " I am feeling a lot better, I am not depressed "  Affect:  appropriate, reactive   Thought Process:  Linear and Descriptions of Associations: Intact  Orientation:  Full (Time, Place, and Person)  Thought Content:  denies hallucinations, no delusions , not internally preoccupied   Suicidal Thoughts:  No denies suicidal ideations , denies self injurious ideations  Homicidal Thoughts:  No  Memory:  Recent and remote grossly intact  Judgement:  Fair  Insight:  Fair  Psychomotor Activity:  Normal-no psychomotor agitation or restlessness  Concentration:  Concentration: Good and Attention Span: Good  Recall:  Good  Fund of Knowledge:  Good  Language:  Good  Akathisia:  Negative  Handed:  Right  AIMS (if indicated):     Assets:  Desire for Improvement Resilience  ADL's:  Intact  Cognition:  WNL  Sleep:  Number of Hours: 3.75    Treatment Plan Summary: Daily contact with patient to assess and evaluate symptoms and progress in treatment, Medication management, Plan  Inpatient treatment and Medications as below  Observation Level/Precautions:  15 minute checks  Laboratory:  As needed TSH, Lipid Panel, Pregnancy Test, EKG   Psychotherapy: Milieu/group  therapy  Medications: Patient reports she has been on a combination of Seroquel, Zoloft, Neurontin for years without side effects and with good response.  . We will continue home medications-Seroquel 450 mg daily, will decrease Zoloft to 200 mg daily (reports he was taking 300 mg daily prior to admission), continue Neurontin at 600 mg twice daily As noted, patient was not taking lamotrigine prior to admission-we will discontinue. Chart review indicates that patient had been started on Macrobid 100 mg twice daily x7 days for suspected UTI on 1/8.  Consultations: As needed  Discharge Concerns:  -  Estimated LOS: 2-3 days   Other: Patient currently presents future oriented, focused on discharge, reporting that she needs to return to work soon in order not to jeopardize her job.  States that she is feeling significant/dramatic relief after finding out she can stay with her grandmother following discharge (homelessness has been a significant stressor).  I describes much improved mood in the context of the above.   Physician Treatment Plan for Primary Diagnosis: Suicidal Ideations.  Adjustment disorder with depressed mood Long Term Goal(s): Improvement in symptoms so as ready for discharge  Short Term Goals: Ability to identify changes in lifestyle to reduce recurrence of condition will improve, Ability to verbalize feelings will improve, Ability to disclose and discuss suicidal ideas, Ability to demonstrate self-control will improve, Ability to identify and develop effective coping behaviors will improve, Ability to maintain clinical measurements within normal limits will improve and Compliance with prescribed medications will improve  Physician Treatment Plan for Secondary Diagnosis: Suicidal ideations Long Term Goal(s): Improvement in symptoms so as ready for discharge  Short Term Goals: Ability to identify changes in lifestyle to reduce recurrence of condition will improve, Ability to verbalize  feelings will improve, Ability to disclose and discuss suicidal ideas, Ability to demonstrate self-control will improve, Ability to identify and develop effective coping behaviors will improve, Ability to maintain clinical measurements within normal limits will improve and Compliance with prescribed medications will improve  I certify that inpatient services furnished can reasonably be expected to improve the patient's condition.    Jenne Campus, MD 1/10/202112:44 PM
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
## 2019-12-27 NOTE — Progress Notes (Signed)
EKG results placed on the outside of shadow chart  Normal Sinus rhythm Normal ECG QT/QTc  392/452 ms
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
## 2019-12-27 NOTE — BHH Suicide Risk Assessment (Signed)
[facility] Admission Suicide Risk Assessment   Nursing information obtained from:   Patient in chart Demographic factors:   34 year old female Current Mental Status:   See below Loss Factors:   Loss of mother who passed away in 03/11/18, homelessness Historical Factors:   History of depression/mood disorder, has been diagnosed bipolar disorder in the past Risk Reduction Factors:   Resilience, sense of responsibility to family/child, employed  Total Time spent with patient: 45 minutes Principal Problem: Adjustment disorder Diagnosis:  Active Problems:   MDD (major depressive disorder), recurrent episode, severe ([place])  Subjective Data:  Continued Clinical Symptoms:    The "Alcohol Use Disorders Identification Test", Guidelines for Use in Primary Care, Second Edition.  World Pharmacologist [facility]). Score between 0-7:  no or low risk or alcohol related problems. Score between 8-15:  moderate risk of alcohol related problems. Score between 16-19:  high risk of alcohol related problems. Score 20 or above:  warrants further diagnostic evaluation for alcohol dependence and treatment.   CLINICAL FACTORS:  34 year old female, presented to ED on 1/8 reporting suicidal ideations, with thoughts of driving her car off a bridge.  Reported contributing stressors, mainly homelessness.  Reports a prior history of bipolar disorder diagnoses and endorses a history of depression in the context of pressor/losses.  Currently does not endorse any clear history of hypomania or mania, does report a history suggestive of intermittent explosive disorder which has improved over time.  Patient reports she has been on a combination of Seroquel, Neurontin, Zoloft for years with good response and tolerance. At this time patient presents euthymic with a full range of affect, denying any suicidal ideations, future oriented, focused on being discharged soon in order to return to work.  She states that her grandmother has informed  her she can live with her after discharge, which has been a great source of relief for patient since homelessness has been a major stressor.  Psychiatric Specialty Exam: Physical Exam  Review of Systems  Blood pressure (!) 168/95, pulse 80, temperature 98.1 F (36.7 C), temperature source Oral, resp. rate 16, last menstrual period 12/25/2019, SpO2 100 %.There is no height or weight on file to calculate BMI.  See admit note MSE    COGNITIVE FEATURES THAT CONTRIBUTE TO RISK:  Closed-mindedness and Loss of executive function    SUICIDE RISK:   Moderate:  Frequent suicidal ideation with limited intensity, and duration, some specificity in terms of plans, no associated intent, good self-control, limited dysphoria/symptomatology, some risk factors present, and identifiable protective factors, including available and accessible social support.  PLAN OF CARE: Patient will be admitted to inpatient psychiatric unit for stabilization and safety. Will provide and encourage milieu participation. Provide medication management and maked adjustments as needed.  Will follow daily.    I certify that inpatient services furnished can reasonably be expected to improve the patient's condition.   Jenne Campus, MD 12/27/2019, 1:23 PM

## 2019-12-27 NOTE — BHH Group Notes (Signed)
[facility] Group Notes: (Clinical Social Work)   12/27/2019      Type of Therapy:  Group Therapy   Participation Level:  Did Not Attend - was invited both individually by MHT and by overhead announcement, chose not to attend.   Selmer Dominion, LCSW 12/27/2019, 11:20 AM

## 2019-12-27 NOTE — BHH Group Notes (Signed)
[facility] Group Notes:  (Nursing/MHT/Case Management/Adjunct)  Date:  12/27/2019  Time:  1300  Type of Therapy:  Nurse Education discussed goals for hospitalization   Participation Level:  Active  Participation Quality:  Appropriate  Affect:  Appropriate  Cognitive:  Appropriate  Insight:  Appropriate  Engagement in Group:  Engaged  Modes of Intervention:  Discussion and Education  Summary of Progress/Problems:  Marissa Calamity 12/27/2019, 2:54 PM

## 2019-12-27 NOTE — Progress Notes (Signed)
D. Pt presents with an anxious affect/ sightly irritable mood, stating, "I'm ready to go home". Pt reports poor sleep last night. Per pt's self inventory, pt rated her depression, hopelessness and anxiety all 5's Pt currently denies SI/HI and AVH A. Labs and vitals monitored. Pt compliant with medications. Pt supported emotionally and encouraged to express concerns and ask questions.   R. Pt remains safe with 15 minute checks. Will continue POC.

## 2019-12-27 NOTE — BHH Suicide Risk Assessment (Signed)
[facility] INPATIENT:  Family/Significant Other Suicide Prevention Education  Suicide Prevention Education:  Patient Refusal for Family/Significant Other Suicide Prevention Education: The patient Amy Yoder has refused to provide written consent for family/significant other to be provided Family/Significant Other Suicide Prevention Education during admission and/or prior to discharge.  Physician notified.  Suicide Prevention Education was reviewed thoroughly with patient, including risk factors, warning signs, and what to do.  Mobile Crisis services were described and that telephone number pointed out, with encouragement to patient to put this number in personal cell phone.  Brochure was provided to patient to share with natural supports.  Patient acknowledged the ways in which they are at risk, and how working through each of their issues can gradually start to reduce their risk factors.  Patient was encouraged to think of the information in the context of people in their own lives.  Patient denied having access to firearms  Patient verbalized understanding of information provided.  Patient endorsed a desire to live.      Berlin Hun Grossman-Orr 12/27/2019, 2:14 PM

## 2019-12-27 NOTE — Progress Notes (Signed)
Psychoeducational Group Note  Date:  12/27/2019 Time:  2030  Group Topic/Focus:  wrap up group  Participation Level: Did Not Attend  Participation Quality:  Not Applicable  Affect:  Not Applicable  Cognitive:  Not Applicable  Insight:  Not Applicable  Engagement in Group: Not Applicable  Additional Comments:  Pt was asleep during group time. Pt did not sleep much last night so staff did not wake pt up. Pt given group materials.   Shellia Cleverly 12/27/2019, 9:25 PM

## 2019-12-27 NOTE — Progress Notes (Signed)
Pt c/o numbness in right hand. Kept her from sleeping well last night. Sleep total = 3.75. Pt says this has been going on for about 4 months. Began as pins and needles feeling in right hand and has progressed to numbness. Contacted provider for medication for pain. Received order for Ibuprofen 600 mg. Advised pt to inform MD of her issues this morning.

## 2019-12-28 ENCOUNTER — Other Ambulatory Visit: Payer: Self-pay | Admitting: Obstetrics

## 2019-12-28 DIAGNOSIS — F3181 Bipolar II disorder: Secondary | ICD-10-CM

## 2019-12-28 LAB — LIPID PANEL
Cholesterol: 170 mg/dL (ref 0–200)
HDL: 27 mg/dL — ABNORMAL LOW (ref >40)
LDL Cholesterol: 106 mg/dL — ABNORMAL HIGH (ref 0–99)
Total CHOL/HDL Ratio: 6.3 RATIO
Triglycerides: 184 mg/dL — ABNORMAL HIGH (ref <150)
VLDL: 37 mg/dL (ref 0–40)

## 2019-12-28 LAB — URINE CULTURE

## 2019-12-28 LAB — HEMOGLOBIN A1C
Hgb A1c MFr Bld: 5.5 % (ref 4.8–5.6)
Mean Plasma Glucose: 111.15 mg/dL

## 2019-12-28 LAB — TSH: TSH: 2.449 u[IU]/mL (ref 0.350–4.500)

## 2019-12-28 LAB — PREGNANCY, URINE: Preg Test, Ur: NEGATIVE

## 2019-12-28 MED ORDER — HYDROXYZINE HCL 25 MG PO TABS: 25.0000 mg | ORAL_TABLET | Freq: Three times a day (TID) | ORAL | 0 refills | Status: DC | PRN

## 2019-12-28 MED ORDER — QUETIAPINE FUMARATE 50 MG PO TABS: 50.0000 mg | ORAL_TABLET | Freq: Every day | ORAL | 0 refills | Status: AC

## 2019-12-28 MED ORDER — QUETIAPINE FUMARATE 400 MG PO TABS: 400.0000 mg | ORAL_TABLET | Freq: Every day | ORAL | 0 refills | Status: AC

## 2019-12-28 MED ORDER — GABAPENTIN 300 MG PO CAPS: 600.0000 mg | ORAL_CAPSULE | Freq: Two times a day (BID) | ORAL | 0 refills | Status: DC

## 2019-12-28 MED ORDER — SERTRALINE HCL 100 MG PO TABS: 200.0000 mg | ORAL_TABLET | Freq: Every day | ORAL | 0 refills | Status: AC

## 2019-12-28 MED ORDER — NICOTINE 7 MG/24HR TD PT24: 7.0000 mg | MEDICATED_PATCH | Freq: Every day | TRANSDERMAL | 0 refills | Status: DC

## 2019-12-28 MED ORDER — PANTOPRAZOLE SODIUM 40 MG PO TBEC: 40.0000 mg | DELAYED_RELEASE_TABLET | Freq: Every day | ORAL | 0 refills | Status: DC

## 2019-12-28 MED ORDER — NITROFURANTOIN MONOHYD MACRO 100 MG PO CAPS: 100.0000 mg | ORAL_CAPSULE | Freq: Two times a day (BID) | ORAL | 0 refills | Status: DC

## 2019-12-28 NOTE — Progress Notes (Signed)
  [facility] Adult Case Management Discharge Plan :  Will you be returning to the same living situation after discharge:  Yes,  home At discharge, do you have transportation home?: Yes,  pt's baby daddy is bringing her car Do you have the ability to pay for your medications: Yes,  medicaid  Release of information consent forms completed and in the chart;  Patient's signature needed at discharge.  Patient to Follow up at: Follow-up Information    [facility] Follow up on 01/12/2020.   Why: Your hospital discharge appointment will be held by phone with Dr.Fred on 01/26 at 1:45pm. Your provider will contact you. Please have access to your hospital discharge paperwork. Contact information: [place] [address] [phone]           Next level of care provider has access to [place] and Suicide Prevention discussed: No.; SPE with pt. Pt declined.      Has patient been referred to the Quitline?: Patient refused referral  Patient has been referred for addiction treatment: Yes  Trecia Rogers, LCSW 12/28/2019, 10:14 AM

## 2019-12-28 NOTE — Progress Notes (Signed)
Pt discharged to lobby. Pt was stable and appreciative at that time. All papers and prescriptions were given and valuables returned. Verbal understanding expressed. Denies SI/HI and A/VH. Pt given opportunity to express concerns and ask questions.  

## 2019-12-28 NOTE — Tx Team (Signed)
Interdisciplinary Treatment and Diagnostic Plan Update  12/28/2019 Time of Session:  Amy Yoder MRN: [id number]  Principal Diagnosis: <principal problem not specified>  Secondary Diagnoses: Active Problems:   MDD (major depressive disorder), recurrent episode, severe (HCC)   Bipolar II disorder ([place])   Current Medications:  Current Facility-Administered Medications  Medication Dose Route Frequency Provider Last Rate Last Admin  . acetaminophen (TYLENOL) tablet 650 mg  650 mg Oral Q6H PRN Emmaline Kluver, FNP      . alum & mag hydroxide-simeth (MAALOX/MYLANTA) 200-200-20 MG/5ML suspension 30 mL  30 mL Oral Q4H PRN Emmaline Kluver, FNP      . gabapentin (NEURONTIN) capsule 600 mg  600 mg Oral BID Cobos, Myer Peer, MD   600 mg at 12/28/19 0801  . hydrOXYzine (ATARAX/VISTARIL) tablet 25 mg  25 mg Oral TID PRN Emmaline Kluver, FNP   25 mg at 12/27/19 1006  . ibuprofen (ADVIL) tablet 600 mg  600 mg Oral Q6H PRN Lindon Romp A, NP   600 mg at 12/27/19 0631  . magnesium hydroxide (MILK OF MAGNESIA) suspension 30 mL  30 mL Oral Daily PRN Emmaline Kluver, FNP      . nicotine (NICODERM CQ - dosed in mg/24 hr) patch 7 mg  7 mg Transdermal Daily Emmaline Kluver, FNP   7 mg at 12/28/19 0802  . nitrofurantoin (macrocrystal-monohydrate) (MACROBID) capsule 100 mg  100 mg Oral Q12H Cobos, Myer Peer, MD   100 mg at 12/28/19 0802  . pantoprazole (PROTONIX) EC tablet 40 mg  40 mg Oral Daily Emmaline Kluver, FNP   40 mg at 12/28/19 0802  . QUEtiapine (SEROQUEL) tablet 450 mg  450 mg Oral QHS Cobos, Myer Peer, MD   450 mg at 12/27/19 2118  . sertraline (ZOLOFT) tablet 200 mg  200 mg Oral Daily Cobos, Myer Peer, MD   200 mg at 12/28/19 0801   PTA Medications: Medications Prior to Admission  Medication Sig Dispense Refill Last Dose  . albuterol (PROVENTIL HFA;VENTOLIN HFA) 108 (90 Base) MCG/ACT inhaler Inhale 1-2 puffs into the lungs every 4 (four) hours as needed for wheezing or shortness of breath. 1 Inhaler 0   .  Drospirenone (SLYND) 4 MG TABS Take 1 tablet by mouth daily before breakfast. (Patient not taking: Reported on 12/25/2019) 28 tablet 11   . fluconazole (DIFLUCAN) 150 MG tablet Take 1 tablet (150 mg total) by mouth every 3 (three) days. For three doses (Patient not taking: Reported on 03/16/2019) 3 tablet 3   . gabapentin (NEURONTIN) 300 MG capsule Take 600-900 mg by mouth 2 (two) times daily. Pt states she takes 900 mg in the morning and 600 mg in the afternoon     . ibuprofen (ADVIL,MOTRIN) 800 MG tablet Take 1 tablet (800 mg total) by mouth 3 (three) times daily with meals as needed for headache, mild pain or cramping. (Patient not taking: Reported on 12/25/2019) 60 tablet 1   . ibuprofen (ADVIL,MOTRIN) 800 MG tablet Take 1 tablet (800 mg total) by mouth every 8 (eight) hours as needed. (Patient not taking: Reported on 12/25/2019) 30 tablet 5   . lamoTRIgine (LAMICTAL) 25 MG tablet Take 50 mg by mouth daily.     . nicotine (NICODERM CQ - DOSED IN MG/24 HOURS) 21 mg/24hr patch Place 1 patch (21 mg total) onto the skin at bedtime. (Patient not taking: Reported on 03/02/2019) 28 patch 2   . nitrofurantoin, macrocrystal-monohydrate, (MACROBID) 100 MG capsule Take 1 capsule (100  mg total) by mouth 2 (two) times daily. 1 po BID x 7days (Patient not taking: Reported on 12/25/2019) 14 capsule 2   . omeprazole (PRILOSEC) 20 MG capsule Take 1 capsule (20 mg total) by mouth daily. (Patient not taking: Reported on 12/25/2019) 30 capsule 0   . ondansetron (ZOFRAN) 4 MG tablet Take 1 tablet (4 mg total) by mouth every 6 (six) hours as needed for nausea or vomiting. (Patient not taking: Reported on 12/25/2019) 20 tablet 2   . oxyCODONE-acetaminophen (PERCOCET) 5-325 MG tablet Take 1 tablet by mouth every 6 (six) hours as needed for severe pain. (Patient not taking: Reported on 03/02/2019) 30 tablet 0   . oxyCODONE-acetaminophen (PERCOCET/ROXICET) 5-325 MG tablet Take 1-2 tablets by mouth every 6 (six) hours as needed for severe  pain. (Patient not taking: Reported on 12/25/2019) 30 tablet 0   . promethazine (PHENERGAN) 25 MG tablet Take 1 tablet (25 mg total) by mouth every 6 (six) hours as needed for nausea or vomiting. (Patient not taking: Reported on 12/25/2019) 15 tablet 0   . sertraline (ZOLOFT) 100 MG tablet Take 300 mg by mouth daily.      . varenicline (CHANTIX CONTINUING MONTH PAK) 1 MG tablet Take 1 tablet (1 mg total) by mouth 2 (two) times daily. (Patient not taking: Reported on 12/25/2019) 60 tablet 1   . varenicline (CHANTIX STARTING MONTH PAK) 0.5 MG X 11 & 1 MG X 42 tablet Take one 0.5 mg tablet by mouth once daily for 3 days, then increase to one 0.5 mg tablet twice daily for 4 days, then increase to one 1 mg tablet twice daily. (Patient not taking: Reported on 12/25/2019) 53 tablet 0   . [DISCONTINUED] QUEtiapine (SEROQUEL) 400 MG tablet Take 400 mg by mouth daily. Pt takes at bedtime with 50mg  for a total dose of 450mg   1   . [DISCONTINUED] QUEtiapine (SEROQUEL) 50 MG tablet Take 50 mg by mouth at bedtime. Pt takes at bedtime with 400mg  tablet for total dose of 450mg        Patient Stressors:    Patient Strengths:    Treatment Modalities: Medication Management, Group therapy, Case management,  1 to 1 session with clinician, Psychoeducation, Recreational therapy.   Physician Treatment Plan for Primary Diagnosis: <principal problem not specified> Long Term Goal(s): Improvement in symptoms so as ready for discharge Improvement in symptoms so as ready for discharge   Short Term Goals: Ability to identify changes in lifestyle to reduce recurrence of condition will improve Ability to verbalize feelings will improve Ability to disclose and discuss suicidal ideas Ability to demonstrate self-control will improve Ability to identify and develop effective coping behaviors will improve Ability to maintain clinical measurements within normal limits will improve Compliance with prescribed medications will  improve Ability to identify changes in lifestyle to reduce recurrence of condition will improve Ability to verbalize feelings will improve Ability to disclose and discuss suicidal ideas Ability to demonstrate self-control will improve Ability to identify and develop effective coping behaviors will improve Ability to maintain clinical measurements within normal limits will improve Compliance with prescribed medications will improve  Medication Management: Evaluate patient's response, side effects, and tolerance of medication regimen.  Therapeutic Interventions: 1 to 1 sessions, Unit Group sessions and Medication administration.  Evaluation of Outcomes: Adequate for Discharge  Physician Treatment Plan for Secondary Diagnosis: Active Problems:   MDD (major depressive disorder), recurrent episode, severe (HCC)   Bipolar II disorder ([place])  Long Term Goal(s): Improvement in symptoms so  as ready for discharge Improvement in symptoms so as ready for discharge   Short Term Goals: Ability to identify changes in lifestyle to reduce recurrence of condition will improve Ability to verbalize feelings will improve Ability to disclose and discuss suicidal ideas Ability to demonstrate self-control will improve Ability to identify and develop effective coping behaviors will improve Ability to maintain clinical measurements within normal limits will improve Compliance with prescribed medications will improve Ability to identify changes in lifestyle to reduce recurrence of condition will improve Ability to verbalize feelings will improve Ability to disclose and discuss suicidal ideas Ability to demonstrate self-control will improve Ability to identify and develop effective coping behaviors will improve Ability to maintain clinical measurements within normal limits will improve Compliance with prescribed medications will improve     Medication Management: Evaluate patient's response, side effects,  and tolerance of medication regimen.  Therapeutic Interventions: 1 to 1 sessions, Unit Group sessions and Medication administration.  Evaluation of Outcomes: Adequate for Discharge   RN Treatment Plan for Primary Diagnosis: <principal problem not specified> Long Term Goal(s): Knowledge of disease and therapeutic regimen to maintain health will improve  Short Term Goals: Ability to participate in decision making will improve, Ability to verbalize feelings will improve, Ability to disclose and discuss suicidal ideas, Ability to identify and develop effective coping behaviors will improve and Compliance with prescribed medications will improve  Medication Management: RN will administer medications as ordered by provider, will assess and evaluate patient's response and provide education to patient for prescribed medication. RN will report any adverse and/or side effects to prescribing provider.  Therapeutic Interventions: 1 on 1 counseling sessions, Psychoeducation, Medication administration, Evaluate responses to treatment, Monitor vital signs and CBGs as ordered, Perform/monitor CIWA, COWS, AIMS and Fall Risk screenings as ordered, Perform wound care treatments as ordered.  Evaluation of Outcomes: Adequate for Discharge   LCSW Treatment Plan for Primary Diagnosis: <principal problem not specified> Long Term Goal(s): Safe transition to appropriate next level of care at discharge, Engage patient in therapeutic group addressing interpersonal concerns.  Short Term Goals: Engage patient in aftercare planning with referrals and resources  Therapeutic Interventions: Assess for all discharge needs, 1 to 1 time with Social worker, Explore available resources and support systems, Assess for adequacy in community support network, Educate family and significant other(s) on suicide prevention, Complete Psychosocial Assessment, Interpersonal group therapy.  Evaluation of Outcomes: Adequate for  Discharge   Progress in Treatment: Attending groups: Yes. Participating in groups: Yes. Taking medication as prescribed: Yes. Toleration medication: Yes. Family/Significant other contact made: No, will contact:  patient declined consent Patient understands diagnosis: Yes. Discussing patient identified problems/goals with staff: Yes. Medical problems stabilized or resolved: Yes. Denies suicidal/homicidal ideation: Yes. Issues/concerns per patient self-inventory: No. Other:  New problem(s) identified:m None   New Short Term/Long Term Goal(s):Detox, medication stabilization, elimination of SI thoughts, development of comprehensive mental wellness plan.    Patient Goals:    Discharge Plan or Barriers: Patient reports she plans to return home with family at discharge. Patient was living in her car prior to coming to the hospital. Housing resources were provided.  She will follow up with [facility] for outpatient medication management and therapy services at discharge.    Reason for Continuation of Hospitalization:   Estimated Length of Stay: 12/28/2019  Attendees: Patient: 12/28/2019 10:06 AM  Physician: Dr. Neita Garnet, MD 12/28/2019 10:06 AM  Nursing: Coralyn Mark RN 12/28/2019 10:06 AM  RN Care Manager: 12/28/2019 10:06 AM  Social Worker: Lurline Idol  Amy Yoder 12/28/2019 10:06 AM  Recreational Therapist:  12/28/2019 10:06 AM  Other:  12/28/2019 10:06 AM  Other:  12/28/2019 10:06 AM  Other: 12/28/2019 10:06 AM    Scribe for Treatment Team: Marylee Floras, [place] 12/28/2019 10:06 AM

## 2019-12-28 NOTE — [facility] Suicide Risk Assessment (Signed)
[facility] Discharge Suicide Risk Assessment   Principal Problem: depression Discharge Diagnoses: Active Problems:   MDD (major depressive disorder), recurrent episode, severe ([place])   Total Time spent with patient: 30 minutes  Musculoskeletal: Strength & Muscle Tone: within normal limits Gait & Station: normal Patient leans: N/A  Psychiatric Specialty Exam: Review of Systems denies headache, no chest pain, no shortness of breath, no vomiting   Blood pressure (!) 163/103, pulse 79, temperature 99.2 F (37.3 C), temperature source Oral, resp. rate 16, last menstrual period 12/25/2019, SpO2 97 %.There is no height or weight on file to calculate BMI.  General Appearance: Well Groomed  Eye Contact::  Good  Speech:  Normal Rate409  Volume:  Normal  Mood:  improved mood and currently euthymic  Affect:  full range, bright affect   Thought Process:  Linear and Descriptions of Associations: Intact  Orientation:  Full (Time, Place, and Person)  Thought Content:  no hallucinations, no delusions   Suicidal Thoughts:  No denies suicidal or self injurious ideations, denies homicidal or violent ideations , presents future oriented  Homicidal Thoughts:  No  Memory:  recent and remote grossly intact   Judgement:  Other:  improving   Insight:  improving   Psychomotor Activity:  Normal  Concentration:  Good  Recall:  Good  Fund of Knowledge:Good  Language: Good  Akathisia:  Negative  Handed:  Right  AIMS (if indicated):     Assets:  Communication Skills Desire for Improvement Resilience  Sleep:  Number of Hours: 6  Cognition: WNL  ADL's:  Intact   Mental Status Per Nursing Assessment::   On Admission:     Demographic Factors:  4, 69 y old daughter who lives with father, employed , homeless prior to admission  Loss Factors: Homelessness, strained relationship with her child's father  Historical Factors: Reports history of depression related to stressors such as homelessness, history of  ADHD , reports she has been diagnosed with Bipolar Disorder in the past and has responded well to Seroquel , Neurontin , Zoloft which she states she has been on for years without side effects.    Risk Reduction Factors:   Responsible for children under 57 years of age, Sense of responsibility to family, Employed, Living with another person, especially a relative and Positive coping skills or problem solving skills  Continued Clinical Symptoms:  At this time patient is alert, attentive, well related, pleasant on approach. Presents euthymic with bright and full range of affect. No thought disorder, denies  suicidal or self injurious ideations. Denies homicidal or violent ideations . No psychotic symptoms, and presents future oriented .  Behavior on unit calm and in good control.  No disruptive or agitated behaviors . Patient is currently future oriented, looking forward to returning to her job later today and stating she is happy that she will be able to stay with her grandmother so that homelessness, which had been her major concern/stressor, is now resolved . Patient was continued on her home medications , denies side effects . Side effects have been reviewed . Labs reviewed - TSH 2.449, HgbA1C 5.5, Lipid Panel - mildly elevated triglycerides ( 184), low HDL   Cognitive Features That Contribute To Risk:  No gross cognitive deficits noted upon discharge. Is alert , attentive, and oriented x 3   Suicide Risk:  Mild:  Suicidal ideation of limited frequency, intensity, duration, and specificity.  There are no identifiable plans, no associated intent, mild dysphoria and related symptoms, good self-control (both  objective and subjective assessment), few other risk factors, and identifiable protective factors, including available and accessible social support.  Follow-up Information    [facility] Follow up on 01/12/2020.   Why: Your hospital discharge appointment will be held by phone with Dr.Fred on  01/26 at 1:45pm. Your provider will contact you. Please have access to your hospital discharge paperwork. Contact information: [address] [place] [address] [phone]           Plan Of Care/Follow-up recommendations:  Activity:  as tolerated  Diet:  heart healthy Tests:  NA Other:  See below  Patient is expressing readiness for discharge and there are no current grounds for involuntary commitment . She is leaving unit in good spirits, plans to go live with her grandmother and to return to work later today. Follow up as above. Also encouraged to follow up with PCP for medical monitoring as needed .  Jenne Campus, MD 12/28/2019, 9:40 AM

## 2019-12-28 NOTE — Discharge Summary (Addendum)
Physician Discharge Summary Note  Patient:  Amy Yoder is an 34 y.o., female MRN:  [id number] DOB:  Aug 30, 1986 Patient phone:  [phone] (home)  Patient address:   [place] [address],  Total Time spent with patient: 15 minutes  Date of Admission:  12/26/2019 Date of Discharge: 12/28/19  Reason for Admission:  suicidal ideation  Principal Problem: <principal problem not specified> Discharge Diagnoses: Active Problems:   MDD (major depressive disorder), recurrent episode, severe ([place])   Bipolar II disorder ([place])   Past Psychiatric History: History of a psychiatric admission as a teenager. States was diagnosed with "ADHD". Reports she has been diagnosed with ADHD and with bipolar disorder in the past.  She describes a history of depression, mainly in the context of stressors such as homelessness or loss of loved ones.  Endorses brief mood swings but denies any clear history of hypomania or mania.  Describes past history of intermittent explosiveness/angry outbursts, which have tended to improve as she ages. She denies any history of suicide attempts or self-injurious behaviors. Denies history of psychosis.  Denies history of PTSD. She reports that she has been on a combination of Seroquel, Zoloft, Neurontin for years. States "I feel stable on those medications, they help".  Past Medical History:  Past Medical History:  Diagnosis Date  . Bipolar I disorder ([place])   . Bronchitis   . Depression   . Hypertension 02/15/2019  . Marijuana use   . Symptomatic cholelithiasis s/p lap chole 03/12/2014 03/11/2014  . Tobacco use     Past Surgical History:  Procedure Laterality Date  . CHOLECYSTECTOMY N/A 03/12/2014   Procedure: LAPAROSCOPIC CHOLECYSTECTOMY WITH INTRAOPERATIVE CHOLANGIOGRAM;  Surgeon: Merrie Roof, MD;  Location: [facility];  Service: General;  Laterality: N/A;  . DENTAL SURGERY    . DILATION AND CURETTAGE OF UTERUS     x2 for EAB   Family History:  Family  History  Problem Relation Age of Onset  . Hypertension Mother    Family Psychiatric  History: Reports history of alcohol use disorder in family (mother, sister).  No suicides in family. Social History:  Social History   Substance and Sexual Activity  Alcohol Use Yes   Comment: 2x/week     Social History   Substance and Sexual Activity  Drug Use Yes   Comment: occasional Marijuana use    Social History   Socioeconomic History  . Marital status: Single    Spouse name: Not on file  . Number of children: Not on file  . Years of education: Not on file  . Highest education level: Not on file  Occupational History  . Not on file  Tobacco Use  . Smoking status: Current Every Day Smoker    Packs/day: 0.50    Types: Cigarettes  . Smokeless tobacco: Never Used  Substance and Sexual Activity  . Alcohol use: Yes    Comment: 2x/week  . Drug use: Yes    Comment: occasional Marijuana use  . Sexual activity: Yes    Birth control/protection: Condom  Other Topics Concern  . Not on file  Social History Narrative  . Not on file   Social Determinants of Health   Financial Resource Strain:   . Difficulty of Paying Living Expenses: Not on file  Food Insecurity:   . Worried About Charity fundraiser in the Last Year: Not on file  . Ran Out of Food in the Last Year: Not on file  Transportation Needs:   .  Lack of Transportation (Medical): Not on file  . Lack of Transportation (Non-Medical): Not on file  Physical Activity:   . Days of Exercise per Week: Not on file  . Minutes of Exercise per Session: Not on file  Stress:   . Feeling of Stress : Not on file  Social Connections:   . Frequency of Communication with Friends and Family: Not on file  . Frequency of Social Gatherings with Friends and Family: Not on file  . Attends Religious Services: Not on file  . Active Member of Clubs or Organizations: Not on file  . Attends Archivist Meetings: Not on file  . Marital  Status: Not on file    Hospital Course:  From admission H&P: 34 year old female. Presented to ED on 1/8, reporting suicidal ideations,  Endorsing thoughts that she "did not want to be here anymore" .  She reported thoughts of driving her car off a bridge.  She reported significant contributing stressors, mainly homelessness, as well as mother's death, who passed away in 04/04/2018.  Patient states that on day of admission she had "slept in my car", had had an argument with her child's father, and explains " I was having a really bad day". Today patient states "I am feeling a lot better", and states "I think I just had a bad day, that's all".  Currently she denies any suicidal ideations and presents future oriented, hoping for discharge soon so she can return to work. She also reports she found out that she will be able to go live with her grandmother following discharge.  States that this has been a major source of relief for her.  Ms. Ducommun was admitted for depression with suicidal ideation. She remained on the [facility] unit for two days. Zoloft, Neurontin, and Seroquel were continued. She participated in group therapy on the unit. She responded well to treatment with no adverse effects reported. She has shown improved mood, affect, sleep, and interaction. On day of discharge, she presents with bright affect and reports good mood. Her recent homelessness has been a significant stressor for her, and her grandmother and sister have both offered to let her stay with them. She denies any SI/HI/AVH and contracts for safety. She is discharging on the medications listed below. She agrees to follow up at [facility] (see below). Patient is provided with prescriptions for medications upon discharge. Her friend is picking her up for discharge home with family.  Physical Findings: AIMS: Facial and Oral Movements Muscles of Facial Expression: None, normal Lips and Perioral Area: None, normal Jaw: None, normal Tongue: None,  normal,Extremity Movements Upper (arms, wrists, hands, fingers): None, normal Lower (legs, knees, ankles, toes): None, normal, Trunk Movements Neck, shoulders, hips: None, normal, Overall Severity Severity of abnormal movements (highest score from questions above): None, normal Incapacitation due to abnormal movements: None, normal Patient's awareness of abnormal movements (rate only patient's report): No Awareness, Dental Status Current problems with teeth and/or dentures?: No Does patient usually wear dentures?: No  CIWA:    COWS:     Musculoskeletal: Strength & Muscle Tone: within normal limits Gait & Station: normal Patient leans: N/A  Psychiatric Specialty Exam: Physical Exam  Nursing note and vitals reviewed. Constitutional: She is oriented to person, place, and time. She appears well-developed and well-nourished.  Cardiovascular: Normal rate.  Respiratory: Effort normal.  Neurological: She is alert and oriented to person, place, and time.    Review of Systems  Constitutional: Negative.   Respiratory: Negative for cough  and shortness of breath.   Psychiatric/Behavioral: Negative for agitation, behavioral problems, confusion, dysphoric mood, hallucinations, self-injury, sleep disturbance and suicidal ideas. The patient is not nervous/anxious and is not hyperactive.     Blood pressure (!) 155/100, pulse 84, temperature 99.2 F (37.3 C), temperature source Oral, resp. rate 16, last menstrual period 12/25/2019, SpO2 97 %.There is no height or weight on file to calculate BMI.  See MD's discharge SRA      Has this patient used any form of tobacco in the last 30 days? (Cigarettes, Smokeless Tobacco, Cigars, and/or Pipes) Yes, a prescription for an FDA-approved medication for tobacco cessation was offered at discharge.   Blood Alcohol level:  Lab Results  Component Value Date   ETH <10 99991111    Metabolic Disorder Labs:  Lab Results  Component Value Date   HGBA1C 5.5  12/28/2019   MPG 111.15 12/28/2019   MPG 111.15 02/15/2019   No results found for: PROLACTIN Lab Results  Component Value Date   CHOL 170 12/28/2019   TRIG 184 (H) 12/28/2019   HDL 27 (L) 12/28/2019   CHOLHDL 6.3 12/28/2019   VLDL 37 12/28/2019   LDLCALC 106 (H) 12/28/2019    See Psychiatric Specialty Exam and Suicide Risk Assessment completed by Attending Physician prior to discharge.  Discharge destination:  Home  Is patient on multiple antipsychotic therapies at discharge:  No   Has Patient had three or more failed trials of antipsychotic monotherapy by history:  No  Recommended Plan for Multiple Antipsychotic Therapies: NA  Discharge Instructions    Discharge instructions   Complete by: As directed    Patient is instructed to take all prescribed medications as recommended. Report any side effects or adverse reactions to your outpatient psychiatrist. Patient is instructed to abstain from alcohol and illegal drugs while on prescription medications. In the event of worsening symptoms, patient is instructed to call the crisis hotline, 911, or go to the nearest emergency department for evaluation and treatment.     Allergies as of 12/28/2019      Reactions   Penicillins Hives, Shortness Of Breath, Swelling   Boils  Has patient had a PCN reaction causing immediate rash, facial/tongue/throat swelling, SOB or lightheadedness with hypotension: yes Has patient had a PCN reaction causing severe rash involving mucus membranes or skin necrosis: no Has patient had a PCN reaction that required hospitalization: yes, visit Has patient had a PCN reaction occurring within the last 10 years: no If all of the above answers are "NO", then may proceed with Cephalosporin use.      Medication List    STOP taking these medications   albuterol 108 (90 Base) MCG/ACT inhaler Commonly known as: VENTOLIN HFA   Drospirenone 4 MG Tabs Commonly known as: Slynd   fluconazole 150 MG  tablet Commonly known as: DIFLUCAN   ibuprofen 800 MG tablet Commonly known as: ADVIL   lamoTRIgine 25 MG tablet Commonly known as: LAMICTAL   nicotine 21 mg/24hr patch Commonly known as: NICODERM CQ - dosed in mg/24 hours Replaced by: nicotine 7 mg/24hr patch   omeprazole 20 MG capsule Commonly known as: PRILOSEC Replaced by: pantoprazole 40 MG tablet   ondansetron 4 MG tablet Commonly known as: ZOFRAN   oxyCODONE-acetaminophen 5-325 MG tablet Commonly known as: PERCOCET/ROXICET   promethazine 25 MG tablet Commonly known as: PHENERGAN   varenicline 0.5 MG X 11 & 1 MG X 42 tablet Commonly known as: Chantix Starting Month Pak   varenicline 1 MG tablet  Commonly known as: Chantix Continuing Month Pak     TAKE these medications     Indication  gabapentin 300 MG capsule Commonly known as: NEURONTIN Take 2 capsules (600 mg total) by mouth 2 (two) times daily. What changed:   how much to take  additional instructions  Indication: Neuropathic Pain   hydrOXYzine 25 MG tablet Commonly known as: ATARAX/VISTARIL Take 1 tablet (25 mg total) by mouth 3 (three) times daily as needed for anxiety.  Indication: Feeling Anxious   nicotine 7 mg/24hr patch Commonly known as: NICODERM CQ - dosed in mg/24 hr Place 1 patch (7 mg total) onto the skin daily. Start taking on: December 29, 2019 Replaces: nicotine 21 mg/24hr patch  Indication: Nicotine Addiction   nitrofurantoin (macrocrystal-monohydrate) 100 MG capsule Commonly known as: MACROBID Take 1 capsule (100 mg total) by mouth every 12 (twelve) hours. What changed:   when to take this  additional instructions  Indication: Simple Infection of the Urinary Tract   pantoprazole 40 MG tablet Commonly known as: PROTONIX Take 1 tablet (40 mg total) by mouth daily. Start taking on: December 29, 2019 Replaces: omeprazole 20 MG capsule  Indication: Gastroesophageal Reflux Disease   QUEtiapine 400 MG tablet Commonly known  as: SEROQUEL Take 1 tablet (400 mg total) by mouth daily. Pt takes at bedtime with 50mg  for a total dose of 450mg   Indication: Major Depressive Disorder   QUEtiapine 50 MG tablet Commonly known as: SEROQUEL Take 1 tablet (50 mg total) by mouth at bedtime. Pt takes at bedtime with 400mg  tablet for total dose of 450mg   Indication: Major Depressive Disorder   sertraline 100 MG tablet Commonly known as: ZOLOFT Take 2 tablets (200 mg total) by mouth daily. Start taking on: December 29, 2019 What changed: how much to take  Indication: Major Depressive Disorder      Follow-up Information    [facility] Follow up on 01/12/2020.   Why: Your hospital discharge appointment will be held by phone with Dr.Fred on 01/26 at 1:45pm. Your provider will contact you. Please have access to your hospital discharge paperwork. Contact information: [address] [place] [address] [phone]           Follow-up recommendations: Activity as tolerated. Diet as recommended by primary care physician. Keep all scheduled follow-up appointments as recommended.   Comments:   Patient is instructed to take all prescribed medications as recommended. Report any side effects or adverse reactions to your outpatient psychiatrist. Patient is instructed to abstain from alcohol and illegal drugs while on prescription medications. In the event of worsening symptoms, patient is instructed to call the crisis hotline, 911, or go to the nearest emergency department for evaluation and treatment.  Signed: Connye Burkitt, NP 12/28/2019, 1:54 PM   Patient seen, Suicide Assessment Completed.  Disposition Plan Reviewed

## 2020-01-04 ENCOUNTER — Other Ambulatory Visit: Payer: Self-pay | Admitting: Gastroenterology

## 2020-01-04 NOTE — Progress Notes (Signed)
Pre-op endo call completed  

## 2020-01-05 ENCOUNTER — Other Ambulatory Visit (HOSPITAL_COMMUNITY)
Admission: RE | Admit: 2020-01-05 | Discharge: 2020-01-05 | Disposition: A | Discharge status: Home / Self Care | Payer: Medicaid Other | Source: Ambulatory Visit | Attending: Gastroenterology | Admitting: Gastroenterology

## 2020-01-05 DIAGNOSIS — Z01812 Encounter for preprocedural laboratory examination: Secondary | ICD-10-CM | POA: Diagnosis not present

## 2020-01-05 DIAGNOSIS — Z20822 Contact with and (suspected) exposure to covid-19: Secondary | ICD-10-CM | POA: Insufficient documentation

## 2020-01-06 LAB — NOVEL CORONAVIRUS, NAA (HOSP ORDER, SEND-OUT TO REF LAB; TAT 18-24 HRS): SARS-CoV-2, NAA: NOT DETECTED

## 2020-01-08 ENCOUNTER — Encounter (HOSPITAL_COMMUNITY)
Admission: RE | Disposition: A | Discharge status: Home / Self Care | Payer: Self-pay | Source: Home / Self Care | Attending: Gastroenterology

## 2020-01-08 ENCOUNTER — Ambulatory Visit (HOSPITAL_COMMUNITY): Payer: Medicaid Other | Admitting: Certified Registered Nurse Anesthetist

## 2020-01-08 ENCOUNTER — Encounter (HOSPITAL_COMMUNITY): Payer: Self-pay | Admitting: Gastroenterology

## 2020-01-08 ENCOUNTER — Other Ambulatory Visit: Payer: Self-pay

## 2020-01-08 ENCOUNTER — Ambulatory Visit (HOSPITAL_COMMUNITY)
Admission: RE | Admit: 2020-01-08 | Discharge: 2020-01-08 | Disposition: A | Discharge status: Home / Self Care | Payer: Medicaid Other | Attending: Gastroenterology | Admitting: Gastroenterology

## 2020-01-08 DIAGNOSIS — F17210 Nicotine dependence, cigarettes, uncomplicated: Secondary | ICD-10-CM | POA: Insufficient documentation

## 2020-01-08 DIAGNOSIS — I10 Essential (primary) hypertension: Secondary | ICD-10-CM | POA: Diagnosis not present

## 2020-01-08 DIAGNOSIS — Z79899 Other long term (current) drug therapy: Secondary | ICD-10-CM | POA: Insufficient documentation

## 2020-01-08 DIAGNOSIS — K921 Melena: Secondary | ICD-10-CM | POA: Insufficient documentation

## 2020-01-08 DIAGNOSIS — F319 Bipolar disorder, unspecified: Secondary | ICD-10-CM | POA: Diagnosis not present

## 2020-01-08 DIAGNOSIS — C19 Malignant neoplasm of rectosigmoid junction: Secondary | ICD-10-CM | POA: Diagnosis not present

## 2020-01-08 DIAGNOSIS — Z6841 Body Mass Index (BMI) 40.0 and over, adult: Secondary | ICD-10-CM | POA: Diagnosis not present

## 2020-01-08 HISTORY — PX: HEMOSTASIS CLIP PLACEMENT: SHX6857

## 2020-01-08 HISTORY — PX: SUBMUCOSAL TATTOO INJECTION: SHX6856

## 2020-01-08 HISTORY — PX: POLYPECTOMY: SHX5525

## 2020-01-08 HISTORY — PX: FLEXIBLE SIGMOIDOSCOPY: SHX5431

## 2020-01-08 HISTORY — PX: SCLEROTHERAPY: SHX6841

## 2020-01-08 LAB — PREGNANCY, URINE: Preg Test, Ur: NEGATIVE

## 2020-01-08 SURGERY — SIGMOIDOSCOPY, FLEXIBLE
Anesthesia: Monitor Anesthesia Care

## 2020-01-08 MED ORDER — LACTATED RINGERS IV SOLN: INTRAVENOUS | Status: DC

## 2020-01-08 MED ORDER — SPOT INK MARKER SYRINGE KIT
PACK | SUBMUCOSAL | Status: DC | PRN
  Administered 2020-01-08: 2 mL via SUBMUCOSAL

## 2020-01-08 MED ORDER — SPOT INK MARKER SYRINGE KIT
PACK | SUBMUCOSAL | Status: AC
  Filled 2020-01-08: qty 5

## 2020-01-08 MED ORDER — EPINEPHRINE 1 MG/10ML IJ SOSY
PREFILLED_SYRINGE | INTRAMUSCULAR | Status: AC
  Filled 2020-01-08: qty 10

## 2020-01-08 MED ORDER — FLEET ENEMA 7-19 GM/118ML RE ENEM
ENEMA | RECTAL | Status: AC
  Filled 2020-01-08: qty 2

## 2020-01-08 MED ORDER — LIDOCAINE 2% (20 MG/ML) 5 ML SYRINGE
INTRAMUSCULAR | Status: DC | PRN
  Administered 2020-01-08: 60 mg via INTRAVENOUS

## 2020-01-08 MED ORDER — PROPOFOL 10 MG/ML IV BOLUS
INTRAVENOUS | Status: DC | PRN
  Administered 2020-01-08: 125 ug/kg/min via INTRAVENOUS
  Administered 2020-01-08: 40 mg via INTRAVENOUS

## 2020-01-08 MED ORDER — SODIUM CHLORIDE (PF) 0.9 % IJ SOLN
PREFILLED_SYRINGE | INTRAMUSCULAR | Status: DC | PRN
  Administered 2020-01-08: 4 mL

## 2020-01-08 MED ORDER — SODIUM CHLORIDE 0.9 % IV SOLN: INTRAVENOUS | Status: DC

## 2020-01-08 MED ORDER — FLEET ENEMA 7-19 GM/118ML RE ENEM
2.0000 | ENEMA | Freq: Once | RECTAL | Status: AC
  Administered 2020-01-08: 08:00:00 2 via RECTAL

## 2020-01-08 NOTE — Anesthesia Preprocedure Evaluation (Signed)
Anesthesia Evaluation  Patient identified by MRN, date of birth, ID band Patient awake    Reviewed: Allergy & Precautions, NPO status , Patient's Chart, lab work & pertinent test results  Airway Mallampati: I       Dental no notable dental hx. (+) Teeth Intact   Pulmonary Current Smoker and Patient abstained from smoking.,    Pulmonary exam normal breath sounds clear to auscultation       Cardiovascular Normal cardiovascular exam Rhythm:Regular Rate:Normal     Neuro/Psych PSYCHIATRIC DISORDERS Depression Bipolar Disorder negative neurological ROS     GI/Hepatic negative GI ROS, Neg liver ROS,   Endo/Other  Morbid obesity  Renal/GU negative Renal ROS     Musculoskeletal negative musculoskeletal ROS (+)   Abdominal (+) + obese,   Peds  Hematology  (+) anemia ,   Anesthesia Other Findings   Reproductive/Obstetrics negative OB ROS                             Anesthesia Physical Anesthesia Plan  ASA: III  Anesthesia Plan: MAC   Post-op Pain Management:    Induction:   PONV Risk Score and Plan:   Airway Management Planned: Natural Airway, Nasal Cannula and Simple Face Mask  Additional Equipment: None  Intra-op Plan:   Post-operative Plan:   Informed Consent: I have reviewed the patients History and Physical, chart, labs and discussed the procedure including the risks, benefits and alternatives for the proposed anesthesia with the patient or authorized representative who has indicated his/her understanding and acceptance.       Plan Discussed with: CRNA  Anesthesia Plan Comments:         Anesthesia Quick Evaluation

## 2020-01-08 NOTE — Anesthesia Postprocedure Evaluation (Signed)
Anesthesia Post Note  Patient: Amy Yoder  Procedure(s) Performed: FLEXIBLE SIGMOIDOSCOPY (N/A ) POLYPECTOMY HEMOSTASIS CLIP [place] INJECTION SCLEROTHERAPY     Patient location during evaluation: Endoscopy Anesthesia Type: MAC Level of consciousness: awake Pain management: pain level controlled Vital Signs Assessment: post-procedure vital signs reviewed and stable Respiratory status: spontaneous breathing Cardiovascular status: stable Postop Assessment: no apparent nausea or vomiting Anesthetic complications: no    Last Vitals:  Vitals:   01/08/20 0920 01/08/20 0926  BP: (!) 175/122   Pulse: 76 71  Resp: 14 17  Temp:    SpO2: 100% 99%    Last Pain:  Vitals:   01/08/20 0920  TempSrc:   PainSc: 5    Pain Goal:                   Huston Foley

## 2020-01-08 NOTE — H&P (Signed)
  Amy Yoder HPI: The patient was recently identified to have a very large rectosigmoid colon polyp.  It was the source of her hematochezia.  She is here today for a FFS and polypectomy.  Past Medical History:  Diagnosis Date  . Bipolar I disorder ([place])   . Bronchitis   . Depression   . Hypertension 02/15/2019  . Marijuana use   . Symptomatic cholelithiasis s/p lap chole 03/12/2014 03/11/2014  . Tobacco use     Past Surgical History:  Procedure Laterality Date  . CHOLECYSTECTOMY N/A 03/12/2014   Procedure: LAPAROSCOPIC CHOLECYSTECTOMY WITH INTRAOPERATIVE CHOLANGIOGRAM;  Surgeon: Merrie Roof, MD;  Location: [facility];  Service: General;  Laterality: N/A;  . DENTAL SURGERY    . DILATION AND CURETTAGE OF UTERUS     x2 for EAB    Family History  Problem Relation Age of Onset  . Hypertension Mother     Social History:  reports that she has been smoking cigarettes. She has been smoking about 0.50 packs per day. She has never used smokeless tobacco. She reports current alcohol use. She reports current drug use.  Allergies:  Allergies  Allergen Reactions  . Penicillins Hives, Shortness Of Breath and Swelling    Boils  Did it involve swelling of the face/tongue/throat, SOB, or low BP? Yes Did it involve sudden or severe rash/hives, skin peeling, or any reaction on the inside of your mouth or nose? Yes Did you need to seek medical attention at a hospital or doctor's office? Yes When did it last happen?20 years If all above answers are "NO", may proceed with cephalosporin use.     Medications:  Scheduled:  Continuous: . sodium chloride    . lactated ringers      No results found for this or any previous visit (from the past 24 hour(s)).   No results found.  ROS:  As stated above in the HPI otherwise negative.  Last menstrual period 12/25/2019.    PE: Gen: NAD, Alert and Oriented HEENT:  [place]/AT, EOMI Neck: Supple, no LAD Lungs: CTA Bilaterally CV: RRR  without M/G/R ABM: Soft, NTND, +BS Ext: No C/C/E  Assessment/Plan: 1) Large rectosigmoid colon polyp - FFS with polypectomy.  Amy Yoder D 01/08/2020, 7:35 AM

## 2020-01-08 NOTE — Transfer of Care (Signed)
Immediate Anesthesia Transfer of Care Note  Patient: Amy Yoder  Procedure(s) Performed: Procedure(s): FLEXIBLE SIGMOIDOSCOPY (N/A)  Patient Location: PACU and Endoscopy Unit  Anesthesia Type:General  Level of Consciousness: awake, alert  and oriented  Airway & Oxygen Therapy: Patient Spontanous Breathing and Patient connected to nasal cannula oxygen  Post-op Assessment: Report given to RN and Post -op Vital signs reviewed and stable  Post vital signs: Reviewed and stable  Last Vitals:  Vitals:   01/08/20 0806  BP: (!) 143/77  Pulse: 78  Resp: (!) 27  Temp: 37.1 C  SpO2: [id number]    Complications: No apparent anesthesia complications

## 2020-01-08 NOTE — Discharge Instructions (Signed)
      [facility] ENDOSCOPY [place], [place]  [address] Phone:  [phone]   January 08, 2020  Patient: Amy Yoder  Date of Birth: [birth year]  Date of Visit: January 08, 2020    To Whom It May Concern:  Amy Yoder was tested for [facility] January 05, 2020 and kept quarantined until treated on January 08, 2020.          If you have any questions or concerns, please don't hesitate to call.   Sincerely,   Madalen Gavin Isaac    Treatment Team:  Attending Provider: Carol Ada, MD       YOU HAD AN ENDOSCOPIC PROCEDURE TODAY: Refer to the procedure report and other information in the discharge instructions given to you for any specific questions about what was found during the examination. If this information does not answer your questions, please call [place] at [phone] to clarify.   YOU SHOULD EXPECT: Some feelings of bloating in the abdomen. Passage of more gas than usual. Walking can help get rid of the air that was put into your GI tract during the procedure and reduce the bloating. If you had a lower endoscopy (such as a colonoscopy or flexible sigmoidoscopy) you may notice spotting of blood in your stool or on the toilet paper. Some abdominal soreness may be present for a day or two, also.  DIET: Your first meal following the procedure should be a light meal and then it is ok to progress to your normal diet. A half-sandwich or bowl of soup is an example of a good first meal. Heavy or fried foods are harder to digest and may make you feel nauseous or bloated. Drink plenty of fluids but you should avoid alcoholic beverages for 24 hours. If you had an esophageal dilation, please see attached information for diet.   ACTIVITY: Your care partner should take you home directly after the procedure. You should plan to take it easy, moving slowly for the rest of the day. You can resume normal activity the day after the  procedure however YOU SHOULD NOT DRIVE, use power tools, machinery or perform tasks that involve climbing or major physical exertion for 24 hours (because of the sedation medicines used during the test).   SYMPTOMS TO REPORT IMMEDIATELY: A gastroenterologist can be reached at any hour. Please call [phone]  for any of the following symptoms:  Following lower endoscopy (colonoscopy, flexible sigmoidoscopy) Excessive amounts of blood in the stool  Significant tenderness, worsening of abdominal pains  Swelling of the abdomen that is new, acute  Fever of 100 or higher  Black, tarry-looking or red, bloody stools  FOLLOW UP:  If any biopsies were taken you will be contacted by phone or by letter within the next 1-3 weeks. Call [phone]  if you have not heard about the biopsies in 3 weeks.  Please also call with any specific questions about appointments or follow up tests.

## 2020-01-08 NOTE — Op Note (Signed)
[facility] Patient Name: Amy Yoder Procedure Date: 01/08/2020 MRN: [id number] Attending MD: Carol Ada , MD Date of Birth: 08/20/1986 CSN: [id number] Age: 34 Admit Type: Outpatient Procedure:                Flexible Sigmoidoscopy Indications:              Hematochezia Providers:                Carol Ada, MD, Baird Cancer, RN, William Dalton, Technician, Theodora Blow, Technician Referring MD:              Medicines:                Propofol per Anesthesia Complications:            No immediate complications. Estimated Blood Loss:     Estimated blood loss: none. Procedure:                Pre-Anesthesia Assessment:                           - Prior to the procedure, a History and Physical                            was performed, and patient medications and                            allergies were reviewed. The patient's tolerance of                            previous anesthesia was also reviewed. The risks                            and benefits of the procedure and the sedation                            options and risks were discussed with the patient.                            All questions were answered, and informed consent                            was obtained. Prior Anticoagulants: The patient has                            taken no previous anticoagulant or antiplatelet                            agents. ASA Grade Assessment: II - A patient with                            mild systemic disease. After reviewing the risks  and benefits, the patient was deemed in                            satisfactory condition to undergo the procedure.                           - Sedation was administered by an anesthesia                            professional. Deep sedation was attained.                           After obtaining informed consent, the scope was                            passed under direct  vision. The [id number]                            Olympus gastroscope was introduced through the anus                            and advanced to the the sigmoid colon. The flexible                            sigmoidoscopy was accomplished without difficulty.                            The patient tolerated the procedure well. The                            quality of the bowel preparation was good. Scope In: 8:21:34 AM Scope Out: 8:55:43 AM Total Procedure Duration: 0 hours 34 minutes 9 seconds  Findings:      A 35 mm polyp was found in the recto-sigmoid colon. The polyp was       semi-pedunculated. The polyp was removed with a hot snare. The polyp was       removed with a piecemeal technique using a hot snare. Resection and       retrieval were complete. To prevent bleeding post-intervention, three       hemostatic clips were successfully placed (MR unsafe). There was no       bleeding at the end of the procedure. Area was successfully injected       with 3 mL of a 1:10,000 solution of epinephrine for hemostasis. Area was       tattooed with an injection of 2 mL of Spot (carbon black).      The polyp was again encountered at 20 cm from the anal verge. The polyp       was approximately 35 mm with a moderately-sized base. Before snaring was       performed a total of 3 ml of 1:10,000 Epi was injected to minimize any       bleeding. Attempts to ensnare the entire polyp failed as a result of its       size and positioning. Piecemeal resection was performed to allow for the       base to be ensnared.  An excellent resection was achieved and any       suspicious edges were treated with snare tip cautery. The area was       secured with three Duraclips and the site was tattooed on the opposite       wall. Impression:               - One 35 mm polyp at the recto-sigmoid colon,                            removed with a hot snare and removed piecemeal                            using a hot  snare. Resected and retrieved. Clips                            (MR unsafe) were placed. Injected. Tattooed. Moderate Sedation:      Not Applicable - Patient had care per Anesthesia. Recommendation:           - Patient has a contact number available for                            emergencies. The signs and symptoms of potential                            delayed complications were discussed with the                            patient. Return to normal activities tomorrow.                            Written discharge instructions were provided to the                            patient.                           - Resume previous diet.                           - Repeat flexible sigmoidoscopy in 6 months for                            surveillance.                           - Await pathology results. Procedure Code(s):        --- Professional ---                           [phone], Sigmoidoscopy, flexible; with removal of                            tumor(s), polyp(s), or other lesion(s) by snare  technique Diagnosis Code(s):        --- Professional ---                           K63.5, Polyp of colon                           K92.1, Melena (includes Hematochezia) CPT copyright 2019 American Medical Association. All rights reserved. The codes documented in this report are preliminary and upon coder review may  be revised to meet current compliance requirements. Carol Ada, MD Carol Ada, MD 01/08/2020 9:06:04 AM This report has been signed electronically. Number of Addenda: 0

## 2020-01-11 ENCOUNTER — Encounter: Payer: Self-pay | Admitting: *Deleted

## 2020-01-12 LAB — SURGICAL PATHOLOGY

## 2020-01-14 ENCOUNTER — Ambulatory Visit: Payer: Medicaid Other | Admitting: Orthopedic Surgery

## 2020-01-26 ENCOUNTER — Other Ambulatory Visit: Payer: Self-pay | Admitting: General Surgery

## 2020-01-26 DIAGNOSIS — C189 Malignant neoplasm of colon, unspecified: Secondary | ICD-10-CM

## 2020-01-29 ENCOUNTER — Telehealth: Payer: Self-pay | Admitting: Genetic Counselor

## 2020-01-29 NOTE — Telephone Encounter (Signed)
Received an urgent genetic counseling referral from Dr. Dema Severin at [place] for colon cancer. Amy Yoder has been cld and scheduled to see Raquel Sarna for a mychart video visit on 2/16 at 11am. I verified the pt's email address and cell phone. She doesn't have an active mychart acct, but I did send an invite for her to sign up.

## 2020-02-01 ENCOUNTER — Other Ambulatory Visit (HOSPITAL_COMMUNITY): Payer: Self-pay | Admitting: General Surgery

## 2020-02-01 DIAGNOSIS — C189 Malignant neoplasm of colon, unspecified: Secondary | ICD-10-CM

## 2020-02-02 ENCOUNTER — Encounter: Payer: Self-pay | Admitting: Genetic Counselor

## 2020-02-02 ENCOUNTER — Other Ambulatory Visit: Payer: Self-pay | Admitting: Genetic Counselor

## 2020-02-02 ENCOUNTER — Inpatient Hospital Stay: Payer: Medicaid Other | Attending: Genetic Counselor | Admitting: Genetic Counselor

## 2020-02-02 DIAGNOSIS — Z800 Family history of malignant neoplasm of digestive organs: Secondary | ICD-10-CM | POA: Diagnosis not present

## 2020-02-02 DIAGNOSIS — Z807 Family history of other malignant neoplasms of lymphoid, hematopoietic and related tissues: Secondary | ICD-10-CM

## 2020-02-02 DIAGNOSIS — C189 Malignant neoplasm of colon, unspecified: Secondary | ICD-10-CM | POA: Diagnosis not present

## 2020-02-02 DIAGNOSIS — Z801 Family history of malignant neoplasm of trachea, bronchus and lung: Secondary | ICD-10-CM | POA: Diagnosis not present

## 2020-02-02 DIAGNOSIS — Z808 Family history of malignant neoplasm of other organs or systems: Secondary | ICD-10-CM | POA: Insufficient documentation

## 2020-02-02 NOTE — Progress Notes (Signed)
REFERRING PROVIDER: Ileana Roup, MD [place],  [place] [address]  PRIMARY PROVIDER:  Trey Sailors, [place]  PRIMARY REASON FOR VISIT:  1. Malignant neoplasm of colon, unspecified part of colon ([place])   2. Family history of colon cancer   3. Family history of non-Hodgkin's lymphoma   4. Family history of lung cancer   5. Family history of brain cancer   6. Family history of stomach cancer      I connected with Ms. Ciliberto on 02/02/2020 at 11:00 am EDT by Webex video conference and verified that I am speaking with the correct person using two identifiers.   Patient location: [place] (parked) Provider location: [place] office  HISTORY OF PRESENT ILLNESS:   Ms. Saldierna, a 34 y.o. female, was seen for a [facility] cancer genetics consultation at the request of Dr. Dema Severin due to a personal history of young-onset colon cancer and a family history of cancer.  Ms. Wartman presents to clinic today to discuss the possibility of a hereditary predisposition to cancer, genetic testing, and to further clarify her future cancer risks, as well as potential cancer risks for family members.   In 2021, at the age of 58, Ms. 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      CANCER HISTORY:  Oncology History   No history exists.     RISK FACTORS:  Menarche was at age 26.  First live birth at age 49.  OCP use for approximately 0 years.  Ovaries intact: yes.  Hysterectomy: no.  Menopausal status: premenopausal.  HRT use: 0 years. Colonoscopy: yes; abnormal. Mammogram within the last year: no. Number of breast biopsies: 0. Any excessive radiation exposure in the past: no  Past Medical History:  Diagnosis Date  . Bipolar I disorder ([place])   . Bronchitis   . Depression   . Family history of brain cancer   . Family history of colon cancer   . Family history of lung cancer   . Family history of non-Hodgkin's lymphoma   . Family  history of stomach cancer   . Hypertension 02/15/2019  . Marijuana use   . Symptomatic cholelithiasis s/p lap chole 03/12/2014 03/11/2014  . Tobacco use     Past Surgical History:  Procedure Laterality Date  . CHOLECYSTECTOMY N/A 03/12/2014   Procedure: LAPAROSCOPIC CHOLECYSTECTOMY WITH INTRAOPERATIVE CHOLANGIOGRAM;  Surgeon: Merrie Roof, MD;  Location: [facility];  Service: General;  Laterality: N/A;  . DENTAL SURGERY    . DILATION AND CURETTAGE OF UTERUS     x2 for EAB  . FLEXIBLE SIGMOIDOSCOPY N/A 01/08/2020   Procedure: FLEXIBLE SIGMOIDOSCOPY;  Surgeon: Carol Ada, MD;  Location: [facility];  Service: Endoscopy;  Laterality: N/A;  . HEMOSTASIS CLIP PLACEMENT  01/08/2020   Procedure: HEMOSTASIS CLIP PLACEMENT;  Surgeon: Carol Ada, MD;  Location: [facility];  Service: Endoscopy;;  . POLYPECTOMY  01/08/2020   Procedure: POLYPECTOMY;  Surgeon: Carol Ada, MD;  Location: [facility];  Service: Endoscopy;;  . Clide Deutscher  01/08/2020   Procedure: Clide Deutscher;  Surgeon: Carol Ada, MD;  Location: [facility];  Service: Endoscopy;;  . SUBMUCOSAL TATTOO INJECTION  01/08/2020   Procedure: SUBMUCOSAL TATTOO INJECTION;  Surgeon: Carol Ada, MD;  Location: [facility];  Service: Endoscopy;;    Social History   Socioeconomic History  . Marital status: Single    Spouse name: Not on file  . Number of children: Not on file  . Years of education:  Not on file  . Highest education level: Not on file  Occupational History  . Not on file  Tobacco Use  . Smoking status: Current Every Day Smoker    Packs/day: 0.50    Types: Cigarettes  . Smokeless tobacco: Never Used  Substance and Sexual Activity  . Alcohol use: Yes    Comment: 2x/week  . Drug use: Yes    Comment: occasional Marijuana use  . Sexual activity: Yes    Birth control/protection: Condom  Other Topics Concern  . Not on file  Social History Narrative  . Not on file   Social Determinants of Health    Financial Resource Strain:   . Difficulty of Paying Living Expenses: Not on file  Food Insecurity:   . Worried About Charity fundraiser in the Last Year: Not on file  . Ran Out of Food in the Last Year: Not on file  Transportation Needs:   . Lack of Transportation (Medical): Not on file  . Lack of Transportation (Non-Medical): Not on file  Physical Activity:   . Days of Exercise per Week: Not on file  . Minutes of Exercise per Session: Not on file  Stress:   . Feeling of Stress : Not on file  Social Connections:   . Frequency of Communication with Friends and Family: Not on file  . Frequency of Social Gatherings with Friends and Family: Not on file  . Attends Religious Services: Not on file  . Active Member of Clubs or Organizations: Not on file  . Attends Archivist Meetings: Not on file  . Marital Status: Not on file     FAMILY HISTORY:  We obtained a detailed, 4-generation family history.  Significant diagnoses are listed below: Family History  Problem Relation Age of Onset  . Hypertension Mother   . Non-Hodgkin's lymphoma Mother 29  . Cirrhosis Maternal Grandfather        cirrhosis of the liver  . Lung cancer Paternal Grandfather        dx. late 64s  . Brain cancer Other        dx. >50, unsure if primary or metastasis  . Stomach cancer Other        dx. >50, unsure if primary or metastasis  . Lung cancer Other        dx. >50, unsure if primary or metastasis  . Colon cancer Maternal Great-grandmother        dx. 8s   Ms. Kitamura has one daughter who is 84 years old. She has an identical twin sister, a maternal half-brother who is 32 and maternal half-sister who is 51, and a paternal half-brother who is 50 and two paternal half-sisters who are 7 and 17. None of these individuals have had cancer.   Ms. Renzulli mother died from non-Hodgkin's lymphoma at age 30, which was initially diagnosed at age 24. She believes her mother had a colonoscopy before she died  and had not heard of abnormal results. [name] has a maternal aunt who is currently 47 and has not had cancer. Her maternal grandmother is 62 and has not had cancer, although her great-grandmother (grandmother's mother) had colon cancer in her 19s. Additionally, she had a great-uncle (grandmother's brother) who had brain, stomach, and lung cancer at the same time, diagnosed older than 68. Ms. Hulen is not sure if these were three separate cancers, or if one cancer spread to other parts of his body. She does note that this great-uncle was  a smoker. Her maternal grandfather died in his 35s and had cirrhosis of the liver. There are no other known diagnoses of cancer on the maternal side of the family.  Ms. Roorda father is currently 3 and has not had cancer. Her father recently had a colonoscopy that was normal. She does not have any paternal aunts or uncles. Her paternal grandmother is in her 1s and is healthy, and her paternal grandfather died in his late 58s from lung cancer and had a history of smoking. There are no other known diagnoses of cancer on the paternal side of the family.  Ms. Campoy is unaware of previous family history of genetic testing for hereditary cancer risks. Her ancestry is unknown. There is no reported [name] ancestry. There is no known consanguinity.  GENETIC COUNSELING ASSESSMENT: Ms. Baray is a 34 y.o. female with a personal history of young-onset colon cancer and a family history of colon cancer and possibly brain and stomach cancer, which is somewhat suggestive of a hereditary cancer syndrome and predisposition to cancer. We, therefore, discussed and recommended the following at today's visit.   DISCUSSION: We discussed that approximately 5-10% of cancer is hereditary. Most cases of hereditary colon cancer are associated with Lynch syndrome.  Lynch syndrome is a genetic condition that increases this risk for mainly colon and endometrial cancer, as well as  other cancer types.  There are other genes that can be associated with hereditary colon cancer syndromes, some of which can also lead to a person having many colon polyps.  We discussed that testing is beneficial for several reasons including knowing about other cancer risks, identifying potential screening and risk-reduction options that may be appropriate, and to understand if other family members could be at risk for cancer and allow them to undergo genetic testing.   We reviewed the characteristics, features and inheritance patterns of hereditary cancer syndromes. We also discussed genetic testing, including the appropriate family members to test, the process of testing, insurance coverage and turn-around-time for results. We discussed the implications of a negative, positive and/or variant of uncertain significant result. We recommended Ms. Chronister pursue genetic testing for the [facility] Common Hereditary Cancers panel.   The Common Hereditary Cancers Panel offered by [facility] includes sequencing and/or deletion duplication testing of the following 48 genes: APC, ATM, AXIN2, BARD1, BMPR1A, BRCA1, BRCA2, BRIP1, CDH1, CDK4, CDKN2A (p14ARF), CDKN2A (p16INK4a), CHEK2, CTNNA1, DICER1, EPCAM (Deletion/duplication testing only), GREM1 (promoter region deletion/duplication testing only), KIT, MEN1, MLH1, MSH2, MSH3, MSH6, MUTYH, NBN, NF1, NHTL1, PALB2, PDGFRA, PMS2, POLD1, POLE, PTEN, RAD50, RAD51C, RAD51D, RNF43, SDHB, SDHC, SDHD, SMAD4, SMARCA4. STK11, TP53, TSC1, TSC2, and VHL.  The following genes are evaluated for sequence changes only: SDHA and HOXB13 c.251G>A variant only.   Based on Ms. Allers's personal history of colon cancer diagnosed younger than 73, she meets medical criteria for genetic testing. Despite that she meets criteria, she may still have an out of pocket cost. We discussed that if her out of pocket cost for testing is over $100, the laboratory will call and confirm whether she wants to  proceed with testing.  If the out of pocket cost of testing is less than $100 she will be billed by the genetic testing laboratory.   PLAN: After considering the risks, benefits, and limitations, Ms. Henrikson provided informed consent to pursue genetic testing and the blood sample will be sent to [facility] for analysis of the Common Hereditary Cancers panel. Results should be available within approximately two-three weeks'  time, at which point they will be disclosed by telephone to Ms. Pozzi, as will any additional recommendations warranted by these results. Ms. Delange will receive a summary of her genetic counseling visit and a copy of her results once available. This information will also be available in Epic.   Ms. Ferrufino questions were answered to her satisfaction today. Our contact information was provided should additional questions or concerns arise. Thank you for the referral and allowing [place] to share in the care of your patient.   Clint Guy, MS, [facility] Genetic Counselor [email or web address] Phone: [phone]  The patient was seen for a total of 40 minutes in face-to-face genetic counseling.  This patient was discussed with Drs. Magrinat, Lindi Adie and/or Burr Medico who agrees with the above.    _______________________________________________________________________ For Office Staff:  Number of people involved in session: 1 Was an Intern/ student involved with case: no

## 2020-02-03 ENCOUNTER — Other Ambulatory Visit: Payer: Self-pay

## 2020-02-03 ENCOUNTER — Telehealth: Payer: Self-pay | Admitting: Genetic Counselor

## 2020-02-03 NOTE — Telephone Encounter (Signed)
Rescheduled 2/18 appt due to weather. Pt is aware of new appt date and time.

## 2020-02-03 NOTE — Progress Notes (Signed)
Plan is to perform serial endoscopies for monitoring Dr. Dallas Breeding. Dema Severin

## 2020-02-04 ENCOUNTER — Encounter (HOSPITAL_COMMUNITY): Payer: Self-pay

## 2020-02-04 ENCOUNTER — Ambulatory Visit (HOSPITAL_COMMUNITY): Admission: RE | Admit: 2020-02-04 | Payer: Medicaid Other | Source: Ambulatory Visit

## 2020-02-04 ENCOUNTER — Inpatient Hospital Stay: Payer: Medicaid Other

## 2020-02-08 ENCOUNTER — Inpatient Hospital Stay: Payer: Medicaid Other

## 2020-02-13 ENCOUNTER — Other Ambulatory Visit: Payer: Self-pay | Admitting: Obstetrics

## 2020-02-13 DIAGNOSIS — A5611 Chlamydial female pelvic inflammatory disease: Secondary | ICD-10-CM

## 2020-02-15 ENCOUNTER — Other Ambulatory Visit: Payer: Self-pay

## 2020-02-15 ENCOUNTER — Inpatient Hospital Stay: Payer: Medicaid Other | Attending: Genetic Counselor

## 2020-02-17 ENCOUNTER — Ambulatory Visit (HOSPITAL_COMMUNITY)
Admission: RE | Admit: 2020-02-17 | Discharge: 2020-02-17 | Disposition: A | Discharge status: Home / Self Care | Payer: Medicaid Other | Source: Ambulatory Visit | Attending: General Surgery | Admitting: General Surgery

## 2020-02-17 ENCOUNTER — Other Ambulatory Visit: Payer: Self-pay

## 2020-02-17 DIAGNOSIS — C189 Malignant neoplasm of colon, unspecified: Secondary | ICD-10-CM | POA: Diagnosis present

## 2020-02-17 MED ORDER — IOHEXOL 300 MG/ML  SOLN
100.0000 mL | Freq: Once | INTRAMUSCULAR | Status: AC | PRN
  Administered 2020-02-17: 100 mL via INTRAVENOUS

## 2020-02-17 MED ORDER — SODIUM CHLORIDE (PF) 0.9 % IJ SOLN
INTRAMUSCULAR | Status: AC
  Filled 2020-02-17: qty 50

## 2020-03-10 DIAGNOSIS — Z1509 Genetic susceptibility to other malignant neoplasm: Secondary | ICD-10-CM | POA: Insufficient documentation

## 2020-03-11 ENCOUNTER — Telehealth: Payer: Self-pay | Admitting: Genetic Counselor

## 2020-03-11 ENCOUNTER — Encounter: Payer: Self-pay | Admitting: Genetic Counselor

## 2020-03-11 DIAGNOSIS — Z1379 Encounter for other screening for genetic and chromosomal anomalies: Secondary | ICD-10-CM | POA: Insufficient documentation

## 2020-03-11 NOTE — Telephone Encounter (Signed)
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 This result explains why Amy Yoder developed colon cancer at such a young age, and likely explains some of the cancer in her family history. We set up an appointment for Monday, April 5th at 1:00 pm to discuss this result and Lynch syndrome in more detail. This will be a virtual appointment.

## 2020-03-21 ENCOUNTER — Inpatient Hospital Stay: Payer: Medicaid Other | Admitting: Genetic Counselor

## 2020-03-21 ENCOUNTER — Telehealth: Payer: Self-pay | Admitting: Genetic Counselor

## 2020-03-21 NOTE — Telephone Encounter (Signed)
Called regarding her genetic counseling appointment to see if she is having difficulty joining the virtual visit or if she needs to reschedule. Amy Yoder is currently at work and unable to talk. She will call back to reschedule this appointment.

## 2020-03-29 ENCOUNTER — Inpatient Hospital Stay: Payer: Medicaid Other | Attending: Genetic Counselor | Admitting: Genetic Counselor

## 2020-04-11 ENCOUNTER — Ambulatory Visit: Payer: Self-pay | Admitting: Genetic Counselor

## 2020-04-11 ENCOUNTER — Telehealth: Payer: Self-pay | Admitting: Genetic Counselor

## 2020-04-11 DIAGNOSIS — Z1509 Genetic susceptibility to other malignant neoplasm: Secondary | ICD-10-CM

## 2020-04-11 DIAGNOSIS — Z1379 Encounter for other screening for genetic and chromosomal anomalies: Secondary | ICD-10-CM

## 2020-04-11 NOTE — Telephone Encounter (Signed)
Called to reschedule appointment to discuss genetic test results. Amy Yoder is now declining a follow-up appointment, but is willing to speak on the phone about her results. She is currently at work but will take her lunch at 4:30. Offered to call her back at that time to discuss her results in more detail. She is agreeable - I will call her back at 4:35.

## 2020-04-12 ENCOUNTER — Encounter: Payer: Self-pay | Admitting: Obstetrics

## 2020-04-12 ENCOUNTER — Other Ambulatory Visit (HOSPITAL_COMMUNITY)
Admission: RE | Admit: 2020-04-12 | Discharge: 2020-04-12 | Disposition: A | Discharge status: Home / Self Care | Payer: Medicaid Other | Source: Ambulatory Visit | Attending: Obstetrics | Admitting: Obstetrics

## 2020-04-12 ENCOUNTER — Ambulatory Visit: Payer: Medicaid Other | Admitting: Obstetrics

## 2020-04-12 ENCOUNTER — Other Ambulatory Visit: Payer: Self-pay

## 2020-04-12 VITALS — BP 121/79 | HR 87 | Wt 229.0 lb

## 2020-04-12 DIAGNOSIS — Z3009 Encounter for other general counseling and advice on contraception: Secondary | ICD-10-CM

## 2020-04-12 DIAGNOSIS — R8290 Unspecified abnormal findings in urine: Secondary | ICD-10-CM

## 2020-04-12 DIAGNOSIS — N898 Other specified noninflammatory disorders of vagina: Secondary | ICD-10-CM | POA: Insufficient documentation

## 2020-04-12 DIAGNOSIS — Z113 Encounter for screening for infections with a predominantly sexual mode of transmission: Secondary | ICD-10-CM

## 2020-04-12 DIAGNOSIS — Z1509 Genetic susceptibility to other malignant neoplasm: Secondary | ICD-10-CM

## 2020-04-12 DIAGNOSIS — Z6841 Body Mass Index (BMI) 40.0 and over, adult: Secondary | ICD-10-CM

## 2020-04-12 LAB — POCT URINALYSIS DIPSTICK
Bilirubin, UA: NEGATIVE
Glucose, UA: NEGATIVE
Ketones, UA: NEGATIVE
Leukocytes, UA: NEGATIVE
Nitrite, UA: NEGATIVE
Protein, UA: POSITIVE — AB
Spec Grav, UA: 1.015 (ref 1.010–1.025)
Urobilinogen, UA: 0.2 E.U./dL
pH, UA: 7 (ref 5.0–8.0)

## 2020-04-12 MED ORDER — MEDROXYPROGESTERONE ACETATE 150 MG/ML IM SUSP: 150.0000 mg | INTRAMUSCULAR | 4 refills | Status: DC

## 2020-04-12 NOTE — Progress Notes (Signed)
Pt presents for vaginal issue & malodorous urine Pt requests to discuss issue with MD only

## 2020-04-12 NOTE — Progress Notes (Signed)
Patient ID: Amy Yoder, female   DOB: 11-23-1986, 34 y.o.   MRN: [id number]  Chief Complaint  Patient presents with  . Vaginal Discharge    HPI Amy Yoder is a 34 y.o. female.  Complains of vaginal discharge and malodorous urine.  Recently diagnosed with colon cancer and had a cancerous polyp removed.  She says that they found no residual cancer.  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  Gyn Oncology referral was recommended for further management of the risk for uterine and ovarian cancer. HPI  Past Medical History:  Diagnosis Date  . Bipolar I disorder ([place])   . Bronchitis   . Depression   . Family history of brain cancer   . Family history of colon cancer   . Family history of lung cancer   . Family history of non-Hodgkin's lymphoma   . Family history of stomach cancer   . Hypertension 02/15/2019  . Marijuana use   . Symptomatic cholelithiasis s/p lap chole 03/12/2014 03/11/2014  . Tobacco use     Past Surgical History:  Procedure Laterality Date  . CHOLECYSTECTOMY N/A 03/12/2014   Procedure: LAPAROSCOPIC CHOLECYSTECTOMY WITH INTRAOPERATIVE CHOLANGIOGRAM;  Surgeon: Merrie Roof, MD;  Location: [facility];  Service: General;  Laterality: N/A;  . DENTAL SURGERY    . DILATION AND CURETTAGE OF UTERUS     x2 for EAB  . FLEXIBLE SIGMOIDOSCOPY N/A 01/08/2020   Procedure: FLEXIBLE SIGMOIDOSCOPY;  Surgeon: Carol Ada, MD;  Location: [facility];  Service: Endoscopy;  Laterality: N/A;  . HEMOSTASIS CLIP PLACEMENT  01/08/2020   Procedure: HEMOSTASIS CLIP PLACEMENT;  Surgeon: Carol Ada, MD;  Location: [facility];  Service: Endoscopy;;  . POLYPECTOMY  01/08/2020   Procedure: POLYPECTOMY;  Surgeon: Carol Ada, MD;  Location: [facility];  Service: Endoscopy;;  . Clide Deutscher  01/08/2020   Procedure: Clide Deutscher;  Surgeon: Carol Ada, MD;  Location: [facility];  Service: Endoscopy;;  . SUBMUCOSAL TATTOO INJECTION   01/08/2020   Procedure: SUBMUCOSAL TATTOO INJECTION;  Surgeon: Carol Ada, MD;  Location: [facility];  Service: Endoscopy;;    Family History  Problem Relation Age of Onset  . Hypertension Mother   . Non-Hodgkin's lymphoma Mother 66  . Cirrhosis Maternal Grandfather        cirrhosis of the liver  . Lung cancer Paternal Grandfather        dx. late 40s  . Brain cancer Other        dx. >50, unsure if primary or metastasis  . Stomach cancer Other        dx. >50, unsure if primary or metastasis  . Lung cancer Other        dx. >50, unsure if primary or metastasis  . Colon cancer Maternal Great-grandmother        dx. 6s    Social History Social History   Tobacco Use  . Smoking status: Current Every Day Smoker    Packs/day: 0.50    Types: Cigarettes  . Smokeless tobacco: Never Used  Substance Use Topics  . Alcohol use: Yes    Comment: 2x/week  . Drug use: Yes    Comment: occasional Marijuana use    Allergies  Allergen Reactions  . Penicillins Hives, Shortness Of Breath and Swelling    Boils  Did it involve swelling of the face/tongue/throat, SOB, or low BP? Yes Did it involve sudden or severe rash/hives, skin peeling, or any reaction on  the inside of your mouth or nose? Yes Did you need to seek medical attention at a hospital or doctor's office? Yes When did it last happen?20 years If all above answers are "NO", may proceed with cephalosporin use.     Current Outpatient Medications  Medication Sig Dispense Refill  . gabapentin (NEURONTIN) 300 MG capsule Take 2 capsules (600 mg total) by mouth 2 (two) times daily. 120 capsule 0  . hydrOXYzine (ATARAX/VISTARIL) 25 MG tablet Take 1 tablet (25 mg total) by mouth 3 (three) times daily as needed for anxiety. 15 tablet 0  . QUEtiapine (SEROQUEL) 400 MG tablet Take 1 tablet (400 mg total) by mouth daily. Pt takes at bedtime with 50mg  for a total dose of 450mg  (Patient taking differently: Take 400 mg by mouth at  bedtime. Pt takes at bedtime with 50mg  for a total dose of 450mg ) 30 tablet 0  . QUEtiapine (SEROQUEL) 50 MG tablet Take 1 tablet (50 mg total) by mouth at bedtime. Pt takes at bedtime with 400mg  tablet for total dose of 450mg  30 tablet 0  . sertraline (ZOLOFT) 100 MG tablet Take 2 tablets (200 mg total) by mouth daily. 60 tablet 0  . ibuprofen (ADVIL) 200 MG tablet Take 400 mg by mouth every 6 (six) hours as needed for headache or moderate pain.    . metroNIDAZOLE (FLAGYL) 500 MG tablet Take 1 tablet by mouth twice daily for 14 days (Patient not taking: Reported on 04/12/2020) 28 tablet 0  . Vitamin D, Ergocalciferol, (DRISDOL) 1.25 MG (50000 UNIT) CAPS capsule Take 50,000 Units by mouth every Monday.     No current facility-administered medications for this visit.    Review of Systems Review of Systems Constitutional: negative for fatigue and weight loss Respiratory: negative for cough and wheezing Cardiovascular: negative for chest pain, fatigue and palpitations Gastrointestinal: negative for abdominal pain and change in bowel habits Genitourinary:positive for vaginal discharge and malodorous urine Integument/breast: negative for nipple discharge Musculoskeletal:negative for myalgias Neurological: negative for gait problems and tremors Behavioral/Psych: negative for abusive relationship, depression Endocrine: negative for temperature intolerance      Blood pressure 121/79, pulse 87, weight 229 lb (103.9 kg).  Physical Exam Physical Exam General:   alert  Skin:   no rash or abnormalities  Lungs:   clear to auscultation bilaterally  Heart:   regular rate and rhythm, S1, S2 normal, no murmur, click, rub or gallop  Breasts:   normal without suspicious masses, skin or nipple changes or axillary nodes  Abdomen:  normal findings: no organomegaly, soft, non-tender and no hernia  Pelvis:  External genitalia: normal general appearance Urinary system: urethral meatus normal and bladder  without fullness, nontender Vaginal: normal without tenderness, induration or masses Cervix: normal appearance Adnexa: normal bimanual exam Uterus: anteverted and non-tender, normal size    50% of 25 min visit spent on counseling and coordination of care.   Data Reviewed Labs Office Notes  Assessment and Plan    1. Vaginal discharge Rx: - Cervicovaginal ancillary only( [place]) - POCT Urinalysis Dipstick  2. Screen for STD (sexually transmitted disease) Rx: - Hepatitis B surface antigen - Hepatitis C antibody - HIV Antibody (routine testing w rflx) - RPR  3. Malodorous urine Rx: - Urine Culture  4. Class 3 severe obesity due to excess calories without serious comorbidity with body mass index (BMI) of 40.0 to 44.9 in adult [facility]) - diet changes, exercise and behavioral modification recommended  5. Encounter for other general counseling and  advice on contraception - wants to continue condoms only  6. Lynch syndrome Rx: - Ambulatory referral to Gynecologic Oncology    Plan  Follow up in 3 months for Annual / Pap   Orders Placed This Encounter  Procedures  . Urine Culture  . Hepatitis B surface antigen  . Hepatitis C antibody  . HIV Antibody (routine testing w rflx)  . RPR  . Ambulatory referral to Gynecologic Oncology    Referral Priority:   Routine    Referral Type:   Consultation    Referral Reason:   Specialty Services Required    Requested Specialty:   Gynecologic Oncology    Number of Visits Requested:   1  . POCT Urinalysis Dipstick   Meds ordered this encounter  Medications  . DISCONTD: medroxyPROGESTERone (DEPO-PROVERA) 150 MG/ML injection    Sig: Inject 1 mL (150 mg total) into the muscle every 3 (three) months.    Dispense:  1 mL    Refill:  4     Shelly Bombard, MD 04/12/2020 4:56 PM

## 2020-04-12 NOTE — Patient Instructions (Addendum)
Lynch Syndrome Lynch syndrome, also called hereditary nonpolyposis colorectal cancer (HNPCC), is a condition that increases a person's risk for developing colorectal cancer before age 36. Lynch syndrome can also increase a person's risk for many other types of cancer, including stomach, small intestine, liver, gallbladder, pancreas, urinary tract, skin, and brain cancers. Women with this condition have a higher risk for developing cancer of the ovaries and cancer in the lining of the uterus (endometrium). What are the causes? This condition is caused by a gene mutation that is inherited from one or both parents. A gene mutation is a harmful change in a gene. Not everyone who inherits the genetic mutation develops cancer. What are the signs or symptoms? There are no symptoms of this condition. However, your health care provider may test you for Lynch syndrome if you:  Have colorectal cancer before age 74.  Have family members diagnosed with colorectal, endometrial, or other types of cancer. How is this diagnosed? This condition is diagnosed with:  A review of your family history of cancer.  A blood test to look for the mutations that cause this condition.  Testing of tumor tissue (biopsy). How is this treated? This condition may be managed with:  Genetic counseling to assess your risk and your options for management.  Regular screening tests for the associated cancers. You may need to have a colonoscopy every 1-2 years starting at an early age.  Daily aspirin therapy.  Preventive surgery to remove sites where cancer can develop, such as the colon, uterus, and ovaries. Follow these instructions at home:   Ask your health care provider about your risks.  Discuss a referral for genetic counseling. Ask about the risks and benefits of genetic counseling.  Write down any questions you have about your condition.  Follow your plan for cancer screenings as told by your health care  provider.  Take over-the-counter and prescription medicines only as told by your health care provider.  Maintain a healthy diet.  Consider joining a support group. This may help you learn to cope with the stress of having Lynch syndrome.  Keep all follow-up visits as told by your health care provider. This is important. Contact a health care provider if:  You develop any new or unusual symptoms.  You develop symptoms of colorectal cancer, such as: ? Blood in the stool. ? Changes in bowel habits. ? Abdominal pain or bloating. ? Unexplained weight loss. Summary  Lynch syndrome is caused by an inherited gene mutation. Not everyone who inherits this mutation will develop cancer.  Genetic counseling and blood testing for Lynch syndrome can identify people with the condition.  Regular cancer screening tests are important in managing this condition. This information is not intended to replace advice given to you by your health care provider. Make sure you discuss any questions you have with your health care provider. Document Revised: 08/02/2016 Document Reviewed: 07/27/2016 Elsevier Patient Education  [place].

## 2020-04-12 NOTE — Progress Notes (Addendum)
PRIMARY PROVIDER:  Trey Sailors, PA  PRIMARY REASON FOR VISIT:  1. MLH1-related Lynch syndrome (HNPCC2)   2. Genetic testing     GENETIC TEST RESULTS   Patient Name: Amy Yoder Patient Age: 34 y.o. Encounter Date: 04/11/2020  HPI: Amy Yoder was previously seen in the [place] clinic due to a personal and family history of cancer and concerns regarding a hereditary predisposition to cancer. Please refer to our prior cancer genetics clinic note for more information regarding Amy Yoder's medical, social and family histories, and our assessment and recommendations, at the time. Amy Yoder recent genetic test results were disclosed to her, as were recommendations warranted by these results. These results and recommendations are discussed in more detail below.   FAMILY HISTORY:  We obtained a detailed, 4-generation family history.  Significant diagnoses are listed below: Family History  Problem Relation Age of Onset  . Hypertension Mother   . Non-Hodgkin's lymphoma Mother 12  . Cirrhosis Maternal Grandfather        cirrhosis of the liver  . Lung cancer Paternal Grandfather        dx. late 48s  . Brain cancer Other        dx. >50, unsure if primary or metastasis  . Stomach cancer Other        dx. >50, unsure if primary or metastasis  . Lung cancer Other        dx. >50, unsure if primary or metastasis  . Colon cancer Maternal Great-grandmother        dx. 25s   Amy Yoder has one daughter who is 36 years old. She has an identical twin sister, a maternal half-brother who is 22 and maternal half-sister who is 15, and a paternal half-brother who is 73 and two paternal half-sisters who are 7 and 60. None of these individuals have had cancer.   Amy Yoder mother died from non-Hodgkin's lymphoma at age 71, which was initially diagnosed at age 67. She believes her mother had a colonoscopy before she died and had not heard of abnormal results. Ms.  Elgie Yoder has a maternal aunt who is currently 81 and has not had cancer. Her maternal grandmother is 34 and has not had cancer, although her great-grandmother (grandmother's mother) had colon cancer in her 67s. Additionally, she had a great-uncle (grandmother's brother) who had brain, stomach, and lung cancer at the same time, diagnosed older than 71. Amy Yoder is not sure if these were three separate cancers, or if one cancer spread to other parts of his body. She does note that this great-uncle was a smoker. Her maternal grandfather died in his 56s and had cirrhosis of the liver. There are no other known diagnoses of cancer on the maternal side of the family.  Amy Yoder father is currently 24 and has not had cancer. Her father recently had a colonoscopy that was normal. She does not have any paternal aunts or uncles. Her paternal grandmother is in her 53s and is healthy, and her paternal grandfather died in his late 81s from lung cancer and had a history of smoking. There are no other known diagnoses of cancer on the paternal side of the family.  Amy Yoder is unaware of previous family history of genetic testing for hereditary cancer risks. Her ancestry is unknown. There is no reported [name] ancestry. There is no known consanguinity.  GENETIC TESTING:  Genetic testing reported on 03/10/20 through the Common Hereditary Cancers Panel offered by  [facility]. A single, heterozygous pathogenic variant was detected in the MLH1 gene, called c.1381A>T (p.Lys461*). This result is consistent with a diagnosis of Lynch syndrome.  The Common Hereditary Cancers Panel offered by [facility] includes sequencing and/or deletion duplication testing of the following 48 genes: APC, ATM, AXIN2, BARD1, BMPR1A, BRCA1, BRCA2, BRIP1, CDH1, CDK4, CDKN2A (p14ARF), CDKN2A (p16INK4a), CHEK2, CTNNA1, DICER1, EPCAM (Deletion/duplication testing only), GREM1 (promoter region deletion/duplication testing only),  KIT, MEN1, MLH1, MSH2, MSH3, MSH6, MUTYH, NBN, NF1, NHTL1, PALB2, PDGFRA, PMS2, POLD1, POLE, PTEN, RAD50, RAD51C, RAD51D, RNF43, SDHB, SDHC, SDHD, SMAD4, SMARCA4. STK11, TP53, TSC1, TSC2, and VHL.  The following genes were evaluated for sequence changes only: SDHA and HOXB13 c.251G>A variant only. The test report will be scanned into EPIC and located under the Molecular Pathology section of the Results Review tab.  A portion of the result report is included below for reference.    Genetic testing also identified a variant of uncertain significance (VUS) in the MEN1 gene called c.511C>T (p.Arg171Trp).  At this time, it is unknown if this variant is associated with increased cancer risk or if this is a normal finding, but most variants such as this get reclassified to being inconsequential. It should not be used to make medical management decisions. With time, we suspect the lab will determine the significance of this variant, if any. If we do learn more about it, we will try to contact Amy Yoder to discuss it further. However, it is important to stay in touch with [place] periodically and keep the address and phone number up to date.  UPDATE:  The MEN1 c.511C>T VUS was reclassified to "Likely Benign" on 02/17/2021. The change in variant classification was made as a result of re-review of the evidence in light of new variant interpretation guidelines and/or new information.    CANCER RISKS & SCREENING RECOMMENDATIONS:  We discussed the implications of Lynch syndrome for Amy Yoder and discussed who else in the family should have genetic testing. Individuals with Lynch syndrome have an increased risk for colon cancer and endometrial cancer, as well as other types of cancers. The cancer risks associated with the MLH1 gene are currently known to include:  Colon cancer: 46-61%  Endometrial cancer: 34-54%  Ovarian cancer: 4-20%  Renal pelvis and/or ureter: 0.2-5%  Bladder: 2-7%  Gastric: 5-7%  Small  bowel: 0.4-11%  Pancreatic: 6.2%  Biliary tract: 1.9-3.7%  Brain: 0.7-1.7%   Additional screening and risk-reduction options have been identified to be appropriate for individuals with Lynch syndrome to manage these risks.  We recommended Ms. Lira consider following management guidelines for Lynch syndrome; all of which are outlined below. These can be coordinated by Ms. Sifuentes's GI doctor or primary provider.     Surveillance/prevention strategies outlined by the NCCN guidelines (v1.2020) for individuals (both men and women) with MLH1-related Lynch syndrome: 1. High-quality colonoscopy every 1-2 y, beginning at age 66-25 or 30-5 y prior to the earliest colon cancer if it is diagnosed before age 50 y. 2. Consider the use of 600 mg/daily of aspirin for at least 2 y to decrease CRC risk. The decision to use aspirin in Lynch syndrome should be made on an individualized basis including a discussion of dose, benefits, and adverse effects.  3. While there is no clear evidence to support screening for stomach and small bowel cancer, an upper endoscopy can be considered at 3-5 year intervals beginning at age 57 y. However, whether to have this screening is best determined by a gastroenterologist.  4. There is no clear evidence to support screening for urothelial cancers in Lynch syndrome, although annual urinalysis starting at age 52-35 y may be considered for those with a family history of urothelial cancer.  5. Consider annual physical/neurologic examination starting at age 82-30 y to manage brain cancer risk - no additional screening recommendations have been made. 6. For individuals with a family history of pancreatic cancer, pancreatic cancer screening may be considered beginning at age 40 y (or 62 years younger than the earliest exocrine pancreatic cancer diagnosis in the family, whichever is earlier) when more than one first- or second-degree relatives from the same side of the family as the  identified pathogenic/likely pathogenic variant have had exocrine pancreatic cancer.    For women with Lynch syndrome, unlike the effective surveillance plan for colorectal cancer risk, there is no professional agreement regarding management for the increased risk of uterine and ovarian cancer. For endometrial cancer, women are encouraged to be aware of and immediately report dysfunctional or post-menopausal bleeding, which should then be followed up by an endometrial biopsy. In terms of surveillance, transvaginal ultrasound and endometrial biopsies have not been shown to be effective screening tools; however, they may be considered at the clinician's discretion. Importantly, transvaginal ultrasound is not recommended in pre-menopausal women due to variable presentations throughout a normal menstrual cycle. Endometrial biopsy can be considered every 1-2 years beginning at age 62-35 y, but does not have proven benefit of reducing mortality in women with Lynch syndrome given the typically early presenting symptoms. Finally, while hysterectomy has not been shown to reduce endometrial cancer mortality, it does reduce incidence, and therefore, can be considered as a risk-reducing option.   Unfortunately, symptoms of early stage ovarian cancer are not as obvious as endometrial cancer; however, women are encouraged to be aware of symptoms, such as pelvic or abdominal pain, bloating, increased abdominal girth, difficulty eating, feeling full from eating quickly, as well as increased urinary frequency and urgency. In terms of surveillance, transvaginal ultrasound examination and serum CA-125 have not been shown to be sufficiently sensitive or specific to support for routine screening. In terms of risk-reducing surgery, the decision to undergo a bilateral salpingo-oophorectomy (BSO) should be individualized. However, we are available to help women and their providers establish an individualized surveillance plan. It is  also important for women to understand the following:  1. Women should seek medical attention if they experience abnormal vaginal bleeding. 2. Some providers may still recommend vaginal ultrasounds, uterine biopsies (for uterine cancer risk) and/or CA-125 analysis (for ovarian cancer risk), even though these have not been shown to be effective. 3. A hysterectomy with removal of the ovaries and fallopian tubes could be considered once childbearing is completed (if planned).  FAMILY MEMBERS: Since we now know the mutation in Ms. Nishiyama, we can test at-risk relatives to determine whether or not they have inherited the mutation and are at increased risk for cancer.  It is important that all of Ms. Whiteaker's relatives (both men and women) know of the presence of this gene mutation. Site-specific genetic testing can sort out who in the family is at risk and who is not. We will be happy to meet with any of the family members or refer them to a genetic counselor in their local area. To locate genetic counselors in other cities, individuals can visit the website of the [employer] ([employer]) and Secretary/administrator for a Social worker by zip code.   Ms. Desantiago daughter has a 50%  chance to have inherited this mutation. However, she is relatively young and this will not be of any consequence to her for several years. We do not test children because there is no risk to them until they are adults. We recommend they have genetic counseling and testing by the time they are in their early 20s.    Ms. Bamba siblings have a chance to have also inherited this mutation. We recommend they have genetic testing for this same mutation, as identifying the presence of this mutation would allow them to also take advantage of risk-reducing measures. We discussed that testing will be especially important for her identical twin sister. Because identical twins share the same DNA, we would expect her twin sister to  have inherited this mutation and have Lynch syndrome.  Children who inherit two mutations in the same Lynch gene, one mutation from each parent, are at-risk of a rare recessive condition called constitutional mismatch repair deficiency (CMMR-D) syndrome. If family members have this mutation, they may wish to have their partner tested if they are planning on having children.  PLAN: Ms. Woodroof will need to be followed as high risk based on her diagnosis of Lynch syndrome.    Ms. Yamamoto will plan to follow-up with her gastroenterologist for her diagnosis of Lynch syndrome.  Ms. Friedel has identified a gynecologist that she plans to see for follow up for her diagnosis of Lynch syndrome.  This individual is Dr. Baltazar Najjar.    We strongly encouraged Ms. Dains to remain in contact with [place] in cancer genetics so we can update Ms. Kady's personal and family histories, and inform her of advances in cancer genetics that may be of benefit for the entire family. Ms. Brodbeck knows she is also welcome to call with any questions or concerns, at any time.    Clint Guy, [place], [facility] Licensed, [email or web address] [email or web address] Phone: [phone]

## 2020-04-13 LAB — HEPATITIS B SURFACE ANTIGEN: Hepatitis B Surface Ag: NEGATIVE

## 2020-04-13 LAB — CERVICOVAGINAL ANCILLARY ONLY
Bacterial Vaginitis (gardnerella): POSITIVE — AB
Candida Glabrata: NEGATIVE
Candida Vaginitis: NEGATIVE
Chlamydia: NEGATIVE
Comment: NEGATIVE
Comment: NEGATIVE
Comment: NEGATIVE
Comment: NEGATIVE
Comment: NEGATIVE
Comment: NORMAL
Neisseria Gonorrhea: NEGATIVE
Trichomonas: NEGATIVE

## 2020-04-13 LAB — RPR: RPR Ser Ql: NONREACTIVE

## 2020-04-13 LAB — HEPATITIS C ANTIBODY: Hep C Virus Ab: <0.1 s/co ratio (ref 0.0–0.9)

## 2020-04-13 LAB — HIV ANTIBODY (ROUTINE TESTING W REFLEX): HIV Screen 4th Generation wRfx: NONREACTIVE

## 2020-04-14 ENCOUNTER — Telehealth: Payer: Self-pay | Admitting: *Deleted

## 2020-04-14 NOTE — Telephone Encounter (Signed)
Called and left the patient a message to call the office back. Patient needs to be scheduled for a new patient appt  °

## 2020-04-15 ENCOUNTER — Other Ambulatory Visit: Payer: Self-pay

## 2020-04-15 ENCOUNTER — Telehealth: Payer: Self-pay | Admitting: *Deleted

## 2020-04-15 LAB — URINE CULTURE

## 2020-04-15 MED ORDER — METRONIDAZOLE 500 MG PO TABS: 500.0000 mg | ORAL_TABLET | Freq: Two times a day (BID) | ORAL | 0 refills | Status: DC

## 2020-04-15 MED ORDER — NITROFURANTOIN MONOHYD MACRO 100 MG PO CAPS: 100.0000 mg | ORAL_CAPSULE | Freq: Two times a day (BID) | ORAL | 0 refills | Status: DC

## 2020-04-15 NOTE — Telephone Encounter (Signed)
Called and scheduled the patient for a new pt appt

## 2020-04-16 ENCOUNTER — Other Ambulatory Visit: Payer: Self-pay | Admitting: Obstetrics

## 2020-04-16 NOTE — Progress Notes (Signed)
GYNECOLOGIC ONCOLOGY NEW PATIENT CONSULTATION   Patient Name: Amy Yoder  Patient Age: 34 y.o. 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  A long discussion was held with the patient regarding the rationale for prophylactic removal of the uterus, fallopian tubes and ovaries. We discussed that individuals with Lynch syndrome have a lifetime risk of uterine cancer and ovarian cancer. There is no effective screening test for ovarian/fallopian tube cancers and most cases are fatal due to presentation at an advanced stage. In view of this, prophylactic surgery is recommended when carriers have completed childbearing. Removal of the uterus eliminates the risk of uterine cancer. Removal of the fallopian tubes and ovaries reduces this risk dramatically, but not completely because some of these cancers arise outside the adnexa. Women with Lynch syndrome also have increased risks for stomach, small bowel, urinary tract, and brain cancers. She understands the need for appropriate cancer screening.   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 We discussed that her mutation-specific risk of endometrial cancer is between 34-54% (general population 2.7%). These are usually early stage with median age of 36. The mutation-specific risk of ovarian cancer is between 4-20% (general population 1.3%) with median age of 77.  There is limited data on efficacy of surveillance to reduce EC or OC risk in Lynch syndrome. We recommend annual endometrial sampling, TVUS, and CA-125 beginning at age 61, consistent with  national guidelines. Once childbearing is complete or age 64-45, we recommend risk-reducing hysterectomy and BSO rather than screening.  The patient endorses completion of childbearing.  In that case, I think it would be reasonable to proceed with total hysterectomy and bilateral salpingectomy in the near future.  I would recommend delaying bilateral oophorectomy until the patient is 58-52 years old given risks associated with surgical menopause.  The patient had a CT recently.  Her exam is normal today and endometrial biopsy performed.  I do not think further surveillance testing is necessary at this time.  We will call her with the results of the endometrial biopsy.  We discussed the decrease in surgical morbidity if she is able to lose some weight.  She is very motivated and we discussed that she would work on weight loss for the next 3 months.  I will see her back in late July or early August to discuss possible surgical planning.  As the patient has not been seen by medical oncology, I lysed a referral for this today.  Additionally, while we talked about some diet and lifestyle changes, the patient was interested in speaking with one of our nutritionist.  An outpatient referral was placed to nutrition.  Unless we can get records that confirm the patient is up-to-date on Pap testing, we will plan to perform a Pap test at her next visit.  A copy of this note was sent to the patient's referring provider.   50 minutes of total time was spent for this patient encounter, including preparation, face-to-face counseling with the patient and coordination of care, and documentation of the encounter.   Jeral Pinch, MD  Division of Gynecologic Oncology  Department of Obstetrics and Gynecology  [facility]  ___________________________________________  Chief Complaint: Chief Complaint  Patient presents with  . MLH1-related Lynch syndrome [name]    New Patient     History of Present Illness:  Amy Yoder is a 34 y.o. y.o. female who is seen in consultation at the request of Dr. Jodi Mourning for an evaluation of prophylactic surgery in the setting of Lynch syndrome.  The patient was diagnosed with colon cancer recently.  She underwent genetic testing which revealed Lynch Syndrome (MLH1 mutation). 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  Plan is for close follow-up with GI, has not seen medical oncology.  She recently saw Dr. Jodi Mourning for vaginal discharge and malodorous urine. Work-up revealed BV and Ecoli UTI.  She has completed 3 days of antibiotics with resolution of her urinary symptoms with the exception of urinary frequency.  GYN history notable for PID with bilateral TOAs treated with inpatient IV antibiotics --> PO antibiotics. Was positive for Chlamydia at the time of diagnosis.  She has 1 living child who is 86 years old and although single, she voices not being interested in further childbearing.  Her menses are regular, approximately every month, and last for a total of 4 days with a 1 day break in the middle.  She denies any intermenstrual bleeding.  She has some intermittent stomach pain but denies any pelvic pain or cramping other than mild cramping at the start of her menses.  Today the patient reports overall doing well.  She endorses a good appetite without any nausea or emesis.  She has had some early satiety since just after her colonoscopy.  She notes increased bowel movements (at least 4 times a day) since her cholecystectomy.  She is somewhat frustrated as she has been eating less overall but has gained about 15 to 20 pounds since January.  She walks all day long at work but notes eating fatty foods.  She is also concerned that the Seroquel that she has been on for 17 years is contributing to her issues with weight.  PAST MEDICAL HISTORY:   Past Medical History:  Diagnosis Date  . Bipolar I disorder ([place])   . Bronchitis   . Colon cancer ([place])   . Depression   . Family history of brain cancer   . Family history of colon cancer   . Family history of lung cancer   . Family history of non-Hodgkin's lymphoma   . Family history of stomach cancer   . Hypertension 02/15/2019  . Lynch syndrome   . Marijuana use   . Symptomatic cholelithiasis s/p lap chole 03/12/2014 03/11/2014  . Tobacco use      PAST SURGICAL HISTORY:  Past Surgical History:  Procedure Laterality Date  . CHOLECYSTECTOMY N/A 03/12/2014   Procedure: LAPAROSCOPIC CHOLECYSTECTOMY WITH INTRAOPERATIVE CHOLANGIOGRAM;  Surgeon: Merrie Roof, MD;  Location: [facility];  Service: General;  Laterality: N/A;  . DENTAL SURGERY    . DILATION AND CURETTAGE OF UTERUS     x2 for EAB  . FLEXIBLE SIGMOIDOSCOPY N/A 01/08/2020   Procedure: FLEXIBLE SIGMOIDOSCOPY;  Surgeon: Carol Ada, MD;  Location: [facility] ENDOSCOPY;  Service: Endoscopy;  Laterality: N/A;  . HEMOSTASIS CLIP PLACEMENT  01/08/2020   Procedure: HEMOSTASIS CLIP PLACEMENT;  Surgeon: Carol Ada, MD;  Location: [facility] ENDOSCOPY;  Service: Endoscopy;;  . POLYPECTOMY  01/08/2020   Procedure: POLYPECTOMY;  Surgeon: Carol Ada, MD;  Location: [facility] ENDOSCOPY;  Service: Endoscopy;;  . Clide Deutscher  01/08/2020   Procedure: Clide Deutscher;  Surgeon: Carol Ada, MD;  Location: [facility];  Service: Endoscopy;;  . SUBMUCOSAL TATTOO INJECTION  01/08/2020   Procedure: SUBMUCOSAL TATTOO INJECTION;  Surgeon: Carol Ada, MD;  Location: [facility];  Service: Endoscopy;;    OB/GYN HISTORY:  OB History  Gravida Para Term Preterm AB Living  _0 SAB TAB Ectopic Multiple Live Births  _1 # Outcome Date GA Lbr Len/2nd Weight Sex Delivery Anes PTL Lv  5 SAB           4 SAB           3 TAB           2 TAB           1 Term      Vag-Spont       No LMP recorded.  Age at menarche: 52 Age at menopause: N/A,  last menses ended 7 days ago Hx of HRT: N/A Last pap: Unsure, patient thinks she had one done a week ago but I do not see evidence of this History of abnormal pap smears: Denies  SCREENING STUDIES:  Last mammogram: N/A  Last colonoscopy: 2021  MEDICATIONS: Outpatient Encounter Medications as of 04/18/2020  Medication Sig  . gabapentin (NEURONTIN) 300 MG capsule Take 2 capsules (600 mg total) by mouth 2 (two) times daily.  . hydrOXYzine (ATARAX/VISTARIL) 25 MG tablet Take 1 tablet (25 mg total) by mouth 3 (three) times daily as needed for anxiety.  Marland Kitchen ibuprofen (ADVIL) 200 MG tablet Take 400 mg by mouth every 6 (six) hours as needed for headache or moderate pain.  . metroNIDAZOLE (FLAGYL) 500 MG tablet Take 1 tablet by mouth twice daily for 14 days  . metroNIDAZOLE (FLAGYL) 500 MG tablet Take 1 tablet (500 mg total) by mouth 2 (two) times daily.  . nitrofurantoin, macrocrystal-monohydrate, (MACROBID) 100 MG capsule Take 1 capsule (100 mg total) by mouth 2 (two) times daily.  . QUEtiapine (SEROQUEL) 400 MG tablet Take 1 tablet (400 mg total) by mouth daily. Pt takes at bedtime with 15m for a total dose of 4551m(Patient taking differently: Take 400 mg by mouth at bedtime. Pt takes at bedtime with 5028mor a total dose of 450m19m. QUEtiapine (SEROQUEL) 50 MG tablet Take 1 tablet (50 mg total) by mouth at bedtime. Pt takes at bedtime with 400mg65mlet for total dose of 450mg 15mertraline (ZOLOFT) 100 MG tablet Take 2 tablets (200 mg total) by mouth daily.  . Vitamin D, Ergocalciferol, (DRISDOL) 1.25 MG (50000 UNIT) CAPS capsule Take 50,000 Units by mouth every Monday.   No facility-administered encounter medications on file as of 04/18/2020.    ALLERGIES:  Allergies  Allergen Reactions  . Penicillins Hives, Shortness Of Breath and Swelling    Boils  Did it involve swelling of the face/tongue/throat, SOB, or low BP? Yes Did it involve sudden or severe rash/hives, skin peeling, or any  reaction on the inside of your mouth or nose? Yes Did you need to seek medical attention at a hospital or doctor's office? Yes When did it last happen?20 years If all above answers are "NO", may proceed with cephalosporin use.      FAMILY HISTORY:  Family History  Problem Relation Age of Onset  . Hypertension Mother   . Non-Hodgkin's lymphoma Mother 50  . 35rrhosis Maternal Grandfather  cirrhosis of the liver  . Lung cancer Paternal Grandfather        dx. late 21s  . Brain cancer Other        dx. >50, unsure if primary or metastasis  . Stomach cancer Other        dx. >50, unsure if primary or metastasis  . Lung cancer Other        dx. >50, unsure if primary or metastasis  . Colon cancer Maternal Great-grandmother        dx. 58s     SOCIAL HISTORY:    Social Connections:   . Frequency of Communication with Friends and Family:   . Frequency of Social Gatherings with Friends and Family:   . Attends Religious Services:   . Active Member of Clubs or Organizations:   . Attends Archivist Meetings:   Marland Kitchen Marital Status:     REVIEW OF SYSTEMS:  Pertinent for weight gain, early satiety, urinary frequency, hot flashes, numbness, depression, confusion Denies appetite changes, fevers, chills, fatigue. Denies hearing loss, neck lumps or masses, mouth sores, ringing in ears or voice changes. Denies cough or wheezing.  Denies shortness of breath. Denies chest pain or palpitations. Denies leg swelling. Denies abdominal distention, pain, blood in stools, constipation, diarrhea, nausea, vomiting. Denies pain with intercourse, dysuria, hematuria or incontinence. Denies pelvic pain, vaginal bleeding or vaginal discharge.   Denies joint pain, back pain or muscle pain/cramps. Denies itching, rash, or wounds. Denies dizziness, headaches, or seizures. Denies swollen lymph nodes or glands, denies easy bruising or bleeding. Denies anxiety or decreased  concentration.  Physical Exam:  Vital Signs for this encounter:  Blood pressure 124/75, pulse 75, temperature 98.5 F (36.9 C), temperature source Oral, resp. rate 18, height '5\' 3"'$  (1.6 m), weight 238 lb (108 kg), SpO2 100 %. Body mass index is 42.16 kg/m. General: Alert, oriented, no acute distress.  HEENT: Normocephalic, atraumatic. Sclera anicteric.  Chest: Clear to auscultation bilaterally. No wheezes, rhonchi, or rales. Cardiovascular: Regular rate and rhythm, no murmurs, rubs, or gallops.  Abdomen: Obese. Normoactive bowel sounds. Soft, nondistended, nontender to palpation. No masses or hepatosplenomegaly appreciated. No palpable fluid wave.  Extremities: Grossly normal range of motion. Warm, well perfused. No edema bilaterally.  Skin: No rashes or lesions.  Lymphatics: No cervical, supraclavicular, or inguinal adenopathy.  GU:  Normal external female genitalia.  No lesions. No discharge or bleeding.             Bladder/urethra:  No lesions or masses, well supported bladder             Vagina: Well rugated vaginal mucosa.             Cervix: Normal appearing, no lesions.             Uterus: Small, mobile, no parametrial involvement or nodularity.  Mid axial to anteverted.             Adnexa: No masses appreciated.  Endometrial biopsy procedure The procedure was explained and the patient gave verbal consent.  The speculum was placed and the cervix was prepped with Betadine x3.  Single-tooth tenaculum was placed on the anterior lip of the cervix 6.  The uterus sounded to 8 cm.  An endometrial Pipelle was then used to perform 1 pass with adequate tissue obtained.  Pressure and silver nitrate were used to achieve hemostatics.  The patient tolerated procedure.    LABORATORY AND RADIOLOGIC DATA:  Outside medical records were reviewed  to synthesize the above history, along with the history and physical obtained during the visit.   Lab Results  Component Value Date   WBC 9.8  12/25/2019   HGB 11.9 (L) 12/25/2019   HCT 35.0 (L) 12/25/2019   PLT 407 (H) 12/25/2019   GLUCOSE 102 (H) 12/25/2019   CHOL 170 12/28/2019   TRIG 184 (H) 12/28/2019   HDL 27 (L) 12/28/2019   LDLCALC 106 (H) 12/28/2019   ALT 20 12/25/2019   AST 19 12/25/2019   NA 142 12/25/2019   K 3.6 12/25/2019   CL 109 12/25/2019   CREATININE 0.70 12/25/2019   BUN 8 12/25/2019   CO2 20 (L) 12/25/2019   TSH 2.449 12/28/2019   HGBA1C 5.5 12/28/2019   CT C/A/P on 02/17/20: IMPRESSION: No colonic wall thickening or mass is evident on CT. No findings suspicious for metastatic disease.

## 2020-04-18 ENCOUNTER — Telehealth: Payer: Self-pay | Admitting: Oncology

## 2020-04-18 ENCOUNTER — Encounter: Payer: Self-pay | Admitting: Gynecologic Oncology

## 2020-04-18 ENCOUNTER — Inpatient Hospital Stay: Payer: Medicaid Other | Attending: Gynecologic Oncology | Admitting: Gynecologic Oncology

## 2020-04-18 ENCOUNTER — Encounter: Payer: Self-pay | Admitting: Oncology

## 2020-04-18 ENCOUNTER — Other Ambulatory Visit: Payer: Self-pay

## 2020-04-18 VITALS — BP 124/75 | HR 75 | Temp 98.5°F | Resp 18 | Ht 63.0 in | Wt 238.0 lb

## 2020-04-18 DIAGNOSIS — Z6841 Body Mass Index (BMI) 40.0 and over, adult: Secondary | ICD-10-CM

## 2020-04-18 DIAGNOSIS — Z1502 Genetic susceptibility to malignant neoplasm of ovary: Secondary | ICD-10-CM | POA: Diagnosis present

## 2020-04-18 DIAGNOSIS — Z4002 Encounter for prophylactic removal of ovary: Secondary | ICD-10-CM | POA: Diagnosis not present

## 2020-04-18 DIAGNOSIS — Z4009 Encounter for prophylactic removal of other organ: Secondary | ICD-10-CM | POA: Diagnosis not present

## 2020-04-18 DIAGNOSIS — Z148 Genetic carrier of other disease: Secondary | ICD-10-CM

## 2020-04-18 DIAGNOSIS — Z1504 Genetic susceptibility to malignant neoplasm of endometrium: Secondary | ICD-10-CM | POA: Diagnosis not present

## 2020-04-18 DIAGNOSIS — Z1509 Genetic susceptibility to other malignant neoplasm: Secondary | ICD-10-CM | POA: Diagnosis not present

## 2020-04-18 NOTE — Patient Instructions (Signed)
It was a pleasure meeting you today.  I will call you with the results from your endometrial biopsy today.  If you continue to have urinary symptoms once you have finished the course of antibiotics, please contact our office or your gynecologist.  Please call the clinic back later this month or in June to schedule an appointment with me for July or early August.  We will reevaluate at that time getting you on the calendar for surgery.  If you have any questions or concerns before that we have any changes in your bleeding, please call [phone].

## 2020-04-18 NOTE — Telephone Encounter (Signed)
Called Amy Yoder regarding the referral to Medical Oncology.  Advised her that Dr. Benay Spice has reviewed her chart and is recommending close follow up with GI/Dr. Benson Norway and that she does not need to be followed by GI Oncology unless there is a suspicion for recurrance.  She verbalized understanding and agreement.

## 2020-04-19 ENCOUNTER — Telehealth: Payer: Self-pay | Admitting: Nutrition

## 2020-04-19 ENCOUNTER — Telehealth: Payer: Self-pay | Admitting: *Deleted

## 2020-04-19 LAB — SURGICAL PATHOLOGY

## 2020-04-19 NOTE — Telephone Encounter (Signed)
Scheduled appt per 5/3 sch message - pt aware of apt date and time

## 2020-04-19 NOTE — Telephone Encounter (Signed)
I spoke with Amy Yoder regarding her benign endometrial biopsy. I let her know that there is no sign of cancer or precancer. She is aware to call the clinic in June to schedule a f/u appointment with Dr. Berline Lopes. She also asked Dr. Ulyses Amor office would be calling her so she could f/u with them. I called Dr. Ulyses Amor office, they will call the patient to schedule appropriate follow up care.

## 2020-04-19 NOTE — Telephone Encounter (Signed)
Left Message for patient to call clinic for biopsy results

## 2020-04-22 ENCOUNTER — Encounter: Payer: Self-pay | Admitting: Oncology

## 2020-04-22 DIAGNOSIS — Z6841 Body Mass Index (BMI) 40.0 and over, adult: Secondary | ICD-10-CM

## 2020-04-22 NOTE — Progress Notes (Signed)
Left a message for Amy Yoder canceling apt for nutrition on 04/25/20.  Advised her it will be rescheduled with Nutrition Management and she will receive a call with a new appointment.

## 2020-04-25 ENCOUNTER — Encounter: Payer: Medicaid Other | Admitting: Nutrition

## 2020-04-28 ENCOUNTER — Telehealth: Payer: Self-pay | Admitting: Oncology

## 2020-04-28 ENCOUNTER — Telehealth: Payer: Self-pay | Admitting: Genetic Counselor

## 2020-04-28 NOTE — Telephone Encounter (Signed)
Called Amy Yoder regarding nutrition referral.  Advised her the dietician will not manage weight loss but will give general nutrition information.  If she would like, we could refer her to Medical Weight Management but would be charged a $99.00 upfront fee.  Amy Yoder said that she would still like to talk to the dietician and does not want a referral for Medical Weight Management.

## 2020-04-28 NOTE — Telephone Encounter (Signed)
Amy Yoder requesting that she call back to confirm her mailing address - genetic testing information that had been mailed to the address listed in Epic returned due to insufficient address.

## 2020-06-23 ENCOUNTER — Inpatient Hospital Stay: Payer: Medicaid Other | Attending: Gynecologic Oncology | Admitting: Gynecologic Oncology

## 2020-06-23 ENCOUNTER — Other Ambulatory Visit: Payer: Self-pay

## 2020-06-23 ENCOUNTER — Encounter: Payer: Self-pay | Admitting: Gynecologic Oncology

## 2020-06-23 VITALS — BP 122/58 | HR 87 | Temp 97.8°F | Resp 18 | Ht 63.0 in | Wt 240.0 lb

## 2020-06-23 DIAGNOSIS — Z1509 Genetic susceptibility to other malignant neoplasm: Secondary | ICD-10-CM

## 2020-06-23 DIAGNOSIS — Z85038 Personal history of other malignant neoplasm of large intestine: Secondary | ICD-10-CM | POA: Insufficient documentation

## 2020-06-23 DIAGNOSIS — Z801 Family history of malignant neoplasm of trachea, bronchus and lung: Secondary | ICD-10-CM | POA: Diagnosis not present

## 2020-06-23 DIAGNOSIS — Z6841 Body Mass Index (BMI) 40.0 and over, adult: Secondary | ICD-10-CM

## 2020-06-23 NOTE — Progress Notes (Addendum)
Gynecologic Oncology Return Clinic Visit  06/23/20  Reason for Visit: surveillance in the setting of Lynch syndrome, discussion regarding surgery  Treatment History: Oncology History  Colon cancer ([place])  02/02/2020 Initial Diagnosis   Colon cancer ([place])   03/10/2020 Genetic Testing   Positive genetic testing:  A single pathogenic variant was detected in the MLH1 gene, called c.1381A>T (p.Lys461*), through the [facility] Common Hereditary Cancers panel. A variant of uncertain significance was also detected in the MEN1 gene, called c.511C>T (p.Arg171Trp). The report date is 03/10/2020.  The Common Hereditary Cancers Panel offered by [facility] includes sequencing and/or deletion duplication testing of the following 48 genes: APC, ATM, AXIN2, BARD1, BMPR1A, BRCA1, BRCA2, BRIP1, CDH1, CDK4, CDKN2A (p14ARF), CDKN2A (p16INK4a), CHEK2, CTNNA1, DICER1, EPCAM (Deletion/duplication testing only), GREM1 (promoter region deletion/duplication testing only), KIT, MEN1, MLH1, MSH2, MSH3, MSH6, MUTYH, NBN, NF1, NHTL1, PALB2, PDGFRA, PMS2, POLD1, POLE, PTEN, RAD50, RAD51C, RAD51D, RNF43, SDHB, SDHC, SDHD, SMAD4, SMARCA4. STK11, TP53, TSC1, TSC2, and VHL.  The following genes were evaluated for sequence changes only: SDHA and HOXB13 c.251G>A variant only.     Interval History: The patient overall is frustrated that she has not gained weight from her last visit with me (has gained 2 pounds). She continues to be active at work (walking and lifting). Has not changed her diet at all and reports enjoying Kool-Aid and sweet tea.   Got the [employer] vaccine and was subsequently very sick. Still having headaches intermittently. She denies any change to her symptoms of confusion and memory loss which is being worked up by her PCP.  Past Medical/Surgical History: Past Medical History:  Diagnosis Date  . Bipolar I disorder ([place])   . Bronchitis   . Colon cancer ([place])   . Depression   . Family history of brain cancer    . Family history of colon cancer   . Family history of lung cancer   . Family history of non-Hodgkin's lymphoma   . Family history of stomach cancer   . Hypertension 02/15/2019  . Lynch syndrome   . Marijuana use   . Symptomatic cholelithiasis s/p lap chole 03/12/2014 03/11/2014  . Tobacco use     Past Surgical History:  Procedure Laterality Date  . CHOLECYSTECTOMY N/A 03/12/2014   Procedure: LAPAROSCOPIC CHOLECYSTECTOMY WITH INTRAOPERATIVE CHOLANGIOGRAM;  Surgeon: Merrie Roof, MD;  [facility];  Service: General;  Laterality: N/A;  . DENTAL SURGERY    . DILATION AND CURETTAGE OF UTERUS     x2 for EAB  . FLEXIBLE SIGMOIDOSCOPY N/A 01/08/2020   Procedure: FLEXIBLE SIGMOIDOSCOPY;  Surgeon: Carol Ada, MD;  [facility];  Service: Endoscopy;  Laterality: N/A;  . HEMOSTASIS CLIP PLACEMENT  01/08/2020   Procedure: HEMOSTASIS CLIP PLACEMENT;  Surgeon: Carol Ada, MD;  [facility];  Service: Endoscopy;;  . POLYPECTOMY  01/08/2020   Procedure: POLYPECTOMY;  Surgeon: Carol Ada, MD;  [facility];  Service: Endoscopy;;  . Clide Deutscher  01/08/2020   Procedure: Clide Deutscher;  Surgeon: Carol Ada, MD;  [facility];  Service: Endoscopy;;  . SUBMUCOSAL TATTOO INJECTION  01/08/2020   Procedure: SUBMUCOSAL TATTOO INJECTION;  Surgeon: Carol Ada, MD;  [facility];  Service: Endoscopy;;    Family History  Problem Relation Age of Onset  . Hypertension Mother   . Non-Hodgkin's lymphoma Mother 45  . Cirrhosis Maternal Grandfather        cirrhosis of the liver  . Lung cancer Paternal Grandfather  dx. late 50s  . Brain cancer Other        dx. >50, unsure if primary or metastasis  . Stomach cancer Other        dx. >50, unsure if primary or metastasis  . Lung cancer Other        dx. >50, unsure if primary or metastasis  . Colon cancer Maternal Great-grandmother        dx. 67s    Social History   Socioeconomic  History  . Marital status: Single    Spouse name: Not on file  . Number of children: Not on file  . Years of education: Not on file  . Highest education level: Not on file  Occupational History  . Not on file  Tobacco Use  . Smoking status: Current Every Day Smoker    Packs/day: 0.50    Types: Cigarettes  . Smokeless tobacco: Never Used  Vaping Use  . Vaping Use: Never used  Substance and Sexual Activity  . Alcohol use: Yes    Comment: 2x/week  . Drug use: Yes    Comment: occasional Marijuana use  . Sexual activity: Yes    Birth control/protection: Condom  Other Topics Concern  . Not on file  Social History Narrative  . Not on file   Social Determinants of Health   Financial Resource Strain:   . Difficulty of Paying Living Expenses:   Food Insecurity:   . Worried About Charity fundraiser in the Last Year:   . Arboriculturist in the Last Year:   Transportation Needs:   . Film/video editor (Medical):   Marland Kitchen Lack of Transportation (Non-Medical):   Physical Activity:   . Days of Exercise per Week:   . Minutes of Exercise per Session:   Stress:   . Feeling of Stress :   Social Connections:   . Frequency of Communication with Friends and Family:   . Frequency of Social Gatherings with Friends and Family:   . Attends Religious Services:   . Active Member of Clubs or Organizations:   . Attends Archivist Meetings:   Marland Kitchen Marital Status:     Current Medications:  Current Outpatient Medications:  .  gabapentin (NEURONTIN) 300 MG capsule, Take 2 capsules (600 mg total) by mouth 2 (two) times daily., Disp: 120 capsule, Rfl: 0 .  hydrOXYzine (ATARAX/VISTARIL) 25 MG tablet, Take 1 tablet (25 mg total) by mouth 3 (three) times daily as needed for anxiety., Disp: 15 tablet, Rfl: 0 .  ibuprofen (ADVIL) 200 MG tablet, Take 400 mg by mouth every 6 (six) hours as needed for headache or moderate pain., Disp: , Rfl:  .  metroNIDAZOLE (FLAGYL) 500 MG tablet, Take 1 tablet  by mouth twice daily for 14 days, Disp: 28 tablet, Rfl: 0 .  metroNIDAZOLE (FLAGYL) 500 MG tablet, Take 1 tablet (500 mg total) by mouth 2 (two) times daily., Disp: 14 tablet, Rfl: 0 .  nitrofurantoin, macrocrystal-monohydrate, (MACROBID) 100 MG capsule, Take 1 capsule (100 mg total) by mouth 2 (two) times daily., Disp: 14 capsule, Rfl: 0 .  QUEtiapine (SEROQUEL) 400 MG tablet, Take 1 tablet (400 mg total) by mouth daily. Pt takes at bedtime with '50mg'$  for a total dose of '450mg'$  (Patient taking differently: Take 400 mg by mouth at bedtime. Pt takes at bedtime with '50mg'$  for a total dose of '450mg'$ ), Disp: 30 tablet, Rfl: 0 .  QUEtiapine (SEROQUEL) 50 MG tablet, Take 1 tablet (50 mg total)  by mouth at bedtime. Pt takes at bedtime with 410m tablet for total dose of 4527m Disp: 30 tablet, Rfl: 0 .  sertraline (ZOLOFT) 100 MG tablet, Take 2 tablets (200 mg total) by mouth daily., Disp: 60 tablet, Rfl: 0  Review of Systems: Reports increased appetite, weight gain, cough, hot flashes, numbness, headaches, depression, confusion. Denies  fevers, chills, fatigue. Denies hearing loss, neck lumps or masses, mouth sores, ringing in ears or voice changes. Denies wheezing.  Denies shortness of breath. Denies chest pain or palpitations. Denies leg swelling. Denies abdominal distention, pain, blood in stools, constipation, diarrhea, nausea, vomiting, or early satiety. Denies pain with intercourse, dysuria, frequency, hematuria or incontinence. Denies pelvic pain, vaginal bleeding or vaginal discharge.  Denies joint pain, back pain or muscle pain/cramps. Denies itching, rash, or wounds. Denies dizziness or seizures. Denies swollen lymph nodes or glands, denies easy bruising or bleeding. Denies anxiety or decreased concentration.  Physical Exam: BP (!) 122/58 (BP Location: Right Arm, Patient Position: Sitting)   Pulse 87   Temp 97.8 F (36.6 C) (Tympanic)   Resp 18   Ht 5' 3" (1.6 m)   Wt 240 lb (108.9 kg)    SpO2 100%   BMI 42.51 kg/m  General: Alert, oriented, no acute distress. HEENT: Atraumatic, normocephalic, sclera anicteric. Chest: Unlabored breathing on room air.  Extremities: Grossly normal range of motion.  Warm, well perfused.  No edema bilaterally.  Laboratory & Radiologic Studies: None new  Assessment & Plan: AlTHEREASA IANNELLOs a [age].o. 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   The patient had multiple questions about her genetic testing and Lynch syndrome diagnosis. Despite our long discussion at her initial visit, she was under the impression that her uterus needed to be surgically removed to treat or prevent metastatic colon cancer. I reviewed today that her genetic mutation puts her at increased risk for multiple cancers, including both colon cancer and uterine cancer. We reviewed the guidelines again for screening for uterine cancer and the age at which hysterectomy is recommended for risk-reduction. I still would like to see the patient achieve some weight loss to decrease her peri-operative surgical risk.  We spent the majority of the visit talking about weight loss strategies. The patient met with the dietician but ended up not wanting to pay the fee required to have a plan created to help her achieve weight loss. I discussed healthy and unhealthy food choices. I recommended that she begin by keeping a diary for 2 weeks of everything she consumes. My guess is that she consumes at least 500 calories in beverages alone daily. She will then email this list and I will call her to talk about incremental changes that we can make to her food choices. The patient voiced willingness and motivation to do this.   She was scheduled for a 3 months follow-up visit.   35 minutes of total time was spent for this patient encounter, including preparation, face-to-face counseling with the patient and coordination of care, and documentation of the  encounter.  KaJeral PinchMD  Division of Gynecologic Oncology  Department of Obstetrics and Gynecology  [facility]

## 2020-06-23 NOTE — Patient Instructions (Addendum)
Keep a food diary over the next 2 weeks and then email it to me.  I will call you on the phone afterward to discuss some dietary changes that you can make.  I will see you in 3 months.  [email or web address]

## 2020-07-10 ENCOUNTER — Other Ambulatory Visit: Payer: Self-pay

## 2020-07-10 ENCOUNTER — Emergency Department (HOSPITAL_COMMUNITY): Payer: Medicaid Other

## 2020-07-10 ENCOUNTER — Emergency Department (HOSPITAL_COMMUNITY)
Admission: EM | Admit: 2020-07-10 | Discharge: 2020-07-10 | Disposition: A | Discharge status: Home / Self Care | Payer: Medicaid Other | Attending: Emergency Medicine | Admitting: Emergency Medicine

## 2020-07-10 ENCOUNTER — Encounter (HOSPITAL_COMMUNITY): Payer: Self-pay

## 2020-07-10 DIAGNOSIS — R197 Diarrhea, unspecified: Secondary | ICD-10-CM | POA: Diagnosis not present

## 2020-07-10 DIAGNOSIS — Z79899 Other long term (current) drug therapy: Secondary | ICD-10-CM | POA: Diagnosis not present

## 2020-07-10 DIAGNOSIS — R112 Nausea with vomiting, unspecified: Secondary | ICD-10-CM | POA: Insufficient documentation

## 2020-07-10 DIAGNOSIS — U071 COVID-19: Secondary | ICD-10-CM

## 2020-07-10 DIAGNOSIS — M7918 Myalgia, other site: Secondary | ICD-10-CM | POA: Insufficient documentation

## 2020-07-10 DIAGNOSIS — Z85038 Personal history of other malignant neoplasm of large intestine: Secondary | ICD-10-CM | POA: Diagnosis not present

## 2020-07-10 DIAGNOSIS — R519 Headache, unspecified: Secondary | ICD-10-CM | POA: Diagnosis present

## 2020-07-10 DIAGNOSIS — F1210 Cannabis abuse, uncomplicated: Secondary | ICD-10-CM | POA: Diagnosis not present

## 2020-07-10 DIAGNOSIS — R05 Cough: Secondary | ICD-10-CM | POA: Diagnosis not present

## 2020-07-10 DIAGNOSIS — I10 Essential (primary) hypertension: Secondary | ICD-10-CM | POA: Diagnosis not present

## 2020-07-10 DIAGNOSIS — F17210 Nicotine dependence, cigarettes, uncomplicated: Secondary | ICD-10-CM | POA: Insufficient documentation

## 2020-07-10 DIAGNOSIS — R0602 Shortness of breath: Secondary | ICD-10-CM

## 2020-07-10 LAB — CBC WITH DIFFERENTIAL/PLATELET
Abs Immature Granulocytes: 0.07 10*3/uL (ref 0.00–0.07)
Basophils Absolute: 0.1 10*3/uL (ref 0.0–0.1)
Basophils Relative: 1 %
Eosinophils Absolute: 0 10*3/uL (ref 0.0–0.5)
Eosinophils Relative: 0 %
HCT: 37.2 % (ref 36.0–46.0)
Hemoglobin: 11.8 g/dL — ABNORMAL LOW (ref 12.0–15.0)
Immature Granulocytes: 1 %
Lymphocytes Relative: 13 %
Lymphs Abs: 1.2 10*3/uL (ref 0.7–4.0)
MCH: 26.3 pg (ref 26.0–34.0)
MCHC: 31.7 g/dL (ref 30.0–36.0)
MCV: 82.9 fL (ref 80.0–100.0)
Monocytes Absolute: 1.1 10*3/uL — ABNORMAL HIGH (ref 0.1–1.0)
Monocytes Relative: 13 %
Neutro Abs: 6.3 10*3/uL (ref 1.7–7.7)
Neutrophils Relative %: 72 %
Platelets: 266 10*3/uL (ref 150–400)
RBC: 4.49 MIL/uL (ref 3.87–5.11)
RDW: 13.7 % (ref 11.5–15.5)
WBC: 8.8 10*3/uL (ref 4.0–10.5)
nRBC: 0 % (ref 0.0–0.2)

## 2020-07-10 LAB — URINALYSIS, ROUTINE W REFLEX MICROSCOPIC
Bilirubin Urine: NEGATIVE
Glucose, UA: NEGATIVE mg/dL
Hgb urine dipstick: NEGATIVE
Ketones, ur: 5 mg/dL — AB
Leukocytes,Ua: NEGATIVE
Nitrite: NEGATIVE
Protein, ur: 100 mg/dL — AB
Specific Gravity, Urine: 1.026 (ref 1.005–1.030)
pH: 5 (ref 5.0–8.0)

## 2020-07-10 LAB — I-STAT BETA HCG BLOOD, ED (MC, WL, AP ONLY): I-stat hCG, quantitative: <5 m[IU]/mL (ref <5)

## 2020-07-10 LAB — COMPREHENSIVE METABOLIC PANEL
ALT: 34 U/L (ref 0–44)
AST: 34 U/L (ref 15–41)
Albumin: 4.2 g/dL (ref 3.5–5.0)
Alkaline Phosphatase: 69 U/L (ref 38–126)
Anion gap: 11 (ref 5–15)
BUN: 8 mg/dL (ref 6–20)
CO2: 21 mmol/L — ABNORMAL LOW (ref 22–32)
Calcium: 9 mg/dL (ref 8.9–10.3)
Chloride: 101 mmol/L (ref 98–111)
Creatinine, Ser: 0.73 mg/dL (ref 0.44–1.00)
GFR calc Af Amer: >60 mL/min (ref >60)
GFR calc non Af Amer: >60 mL/min (ref >60)
Glucose, Bld: 94 mg/dL (ref 70–99)
Potassium: 3.9 mmol/L (ref 3.5–5.1)
Sodium: 133 mmol/L — ABNORMAL LOW (ref 135–145)
Total Bilirubin: 0.3 mg/dL (ref 0.3–1.2)
Total Protein: 7.9 g/dL (ref 6.5–8.1)

## 2020-07-10 LAB — LACTIC ACID, PLASMA: Lactic Acid, Venous: 0.8 mmol/L (ref 0.5–1.9)

## 2020-07-10 LAB — SARS CORONAVIRUS 2 BY RT PCR (HOSPITAL ORDER, PERFORMED IN ~~LOC~~ HOSPITAL LAB): SARS Coronavirus 2: POSITIVE — AB

## 2020-07-10 LAB — PROTIME-INR
INR: 1.1 (ref 0.8–1.2)
Prothrombin Time: 13.4 seconds (ref 11.4–15.2)

## 2020-07-10 MED ORDER — SODIUM CHLORIDE 0.9 % IV BOLUS
1000.0000 mL | Freq: Once | INTRAVENOUS | Status: AC
  Administered 2020-07-10: 1000 mL via INTRAVENOUS

## 2020-07-10 MED ORDER — ACETAMINOPHEN 325 MG PO TABS
650.0000 mg | ORAL_TABLET | Freq: Once | ORAL | Status: AC
  Administered 2020-07-10: 650 mg via ORAL
  Filled 2020-07-10: qty 2

## 2020-07-10 MED ORDER — MORPHINE SULFATE (PF) 4 MG/ML IV SOLN
4.0000 mg | Freq: Once | INTRAVENOUS | Status: AC
  Administered 2020-07-10: 4 mg via INTRAVENOUS
  Filled 2020-07-10: qty 1

## 2020-07-10 NOTE — ED Provider Notes (Signed)
[facility] DEPT Provider Note   CSN: [id number] Arrival date & time: 07/10/20  1355     History Chief Complaint  Patient presents with  . Headache  . Fever  . Generalized Body Aches  . Shortness of Breath    Amy Yoder is a 34 y.o. female.  She has a history of colon cancer and is in remission.  Not on chemotherapy.  She was around a close friend this last week who ultimately tested positive for Covid.  She is complaining of 2 days of headache body aches fevers chills cough productive of yellow sputum nausea vomiting diarrhea.  Tried nothing for it.  Last menstrual period 1 week ago.  The history is provided by the patient.  Influenza Presenting symptoms: cough, diarrhea, fatigue, fever, headache, myalgias, nausea, rhinorrhea, shortness of breath and vomiting   Fever:    Timing:  Unable to specify   Max temp PTA:  101   Temp source:  Oral   Progression:  Unchanged Myalgias:    Location:  Generalized   Quality:  Aching   Severity:  Severe   Onset quality:  Gradual   Duration:  2 days   Timing:  Constant   Progression:  Worsening Associated symptoms: chills and nasal congestion   Associated symptoms: no mental status change, no neck stiffness and no syncope   Risk factors: sick contacts   Risk factors: not pregnant        Past Medical History:  Diagnosis Date  . Bipolar I disorder ([place])   . Bronchitis   . Colon cancer ([place])   . Depression   . Family history of brain cancer   . Family history of colon cancer   . Family history of lung cancer   . Family history of non-Hodgkin's lymphoma   . Family history of stomach cancer   . Hypertension 02/15/2019  . Lynch syndrome   . Marijuana use   . Symptomatic cholelithiasis s/p lap chole 03/12/2014 03/11/2014  . Tobacco use     Patient Active Problem List   Diagnosis Date Noted  . BMI 40.0-44.9, adult ([place]) 04/18/2020  . Genetic testing 03/11/2020  . MLH1-related Lynch syndrome  (HNPCC2) 03/10/2020  . Colon cancer ([place]) 02/02/2020  . Family history of colon cancer   . Family history of non-Hodgkin's lymphoma   . Family history of lung cancer   . Family history of brain cancer   . Family history of stomach cancer   . Bipolar II disorder ([place])   . MDD (major depressive disorder), recurrent episode, severe ([place]) 12/27/2019  . Pelvic abscess in female 02/16/2019  . TOA (tubo-ovarian abscess) 02/15/2019  . Hypertension 02/15/2019  . BMI 39.0-39.9,adult 02/15/2019  . Chlamydia 02/14/2019    Past Surgical History:  Procedure Laterality Date  . CHOLECYSTECTOMY N/A 03/12/2014   Procedure: LAPAROSCOPIC CHOLECYSTECTOMY WITH INTRAOPERATIVE CHOLANGIOGRAM;  Surgeon: Merrie Roof, MD;  [facility];  Service: General;  Laterality: N/A;  . DENTAL SURGERY    . DILATION AND CURETTAGE OF UTERUS     x2 for EAB  . FLEXIBLE SIGMOIDOSCOPY N/A 01/08/2020   Procedure: FLEXIBLE SIGMOIDOSCOPY;  Surgeon: Carol Ada, MD;  [facility];  Service: Endoscopy;  Laterality: N/A;  . HEMOSTASIS CLIP PLACEMENT  01/08/2020   Procedure: HEMOSTASIS CLIP PLACEMENT;  Surgeon: Carol Ada, MD;  [facility];  Service: Endoscopy;;  . POLYPECTOMY  01/08/2020   Procedure: POLYPECTOMY;  Surgeon: Carol Ada, MD;  [facility];  Service: Endoscopy;;  . Clide Deutscher  01/08/2020   Procedure: Clide Deutscher;  Surgeon: Carol Ada, MD;  Location: [facility];  Service: Endoscopy;;  . SUBMUCOSAL TATTOO INJECTION  01/08/2020   Procedure: SUBMUCOSAL TATTOO INJECTION;  Surgeon: Carol Ada, MD;  [facility];  Service: Endoscopy;;     OB History    Gravida  5   Para  1   Term  1   Preterm      AB  4   Living  1     SAB  2   TAB  2   Ectopic      Multiple      Live Births  1           Family History  Problem Relation Age of Onset  . Hypertension Mother   . Non-Hodgkin's lymphoma Mother 50  . Cirrhosis Maternal Grandfather         cirrhosis of the liver  . Lung cancer Paternal Grandfather        dx. late 32s  . Brain cancer Other        dx. >50, unsure if primary or metastasis  . Stomach cancer Other        dx. >50, unsure if primary or metastasis  . Lung cancer Other        dx. >50, unsure if primary or metastasis  . Colon cancer Maternal Great-grandmother        dx. 40s    Social History   Tobacco Use  . Smoking status: Current Every Day Smoker    Packs/day: 0.50    Types: Cigarettes  . Smokeless tobacco: Never Used  Vaping Use  . Vaping Use: Never used  Substance Use Topics  . Alcohol use: Yes    Comment: 2x/week  . Drug use: Yes    Comment: occasional Marijuana use    Home Medications Prior to Admission medications   Medication Sig Start Date End Date Taking? Authorizing Provider  gabapentin (NEURONTIN) 300 MG capsule Take 2 capsules (600 mg total) by mouth 2 (two) times daily. 12/28/19   Connye Burkitt, NP  hydrOXYzine (ATARAX/VISTARIL) 25 MG tablet Take 1 tablet (25 mg total) by mouth 3 (three) times daily as needed for anxiety. 12/28/19   Connye Burkitt, NP  ibuprofen (ADVIL) 200 MG tablet Take 400 mg by mouth every 6 (six) hours as needed for headache or moderate pain.    [provider]  metroNIDAZOLE (FLAGYL) 500 MG tablet Take 1 tablet by mouth twice daily for 14 days 02/15/20   Shelly Bombard, MD  metroNIDAZOLE (FLAGYL) 500 MG tablet Take 1 tablet (500 mg total) by mouth 2 (two) times daily. 04/15/20   Shelly Bombard, MD  nitrofurantoin, macrocrystal-monohydrate, (MACROBID) 100 MG capsule Take 1 capsule (100 mg total) by mouth 2 (two) times daily. 04/15/20   Shelly Bombard, MD  QUEtiapine (SEROQUEL) 400 MG tablet Take 1 tablet (400 mg total) by mouth daily. Pt takes at bedtime with 32m for a total dose of 4552mPatient taking differently: Take 400 mg by mouth at bedtime. Pt takes at bedtime with 5053mor a total dose of 450m61m11/21   SykeConnye Burkitt  QUEtiapine (SEROQUEL)  50 MG tablet Take 1 tablet (50 mg total) by mouth at bedtime. Pt takes at bedtime with 400mg29mlet for total dose of 450mg 35m/21   Sykes,Connye Burkittsertraline (ZOLOFT) 100 MG tablet Take 2 tablets (200 mg total) by mouth  daily. 12/29/19   Connye Burkitt, NP    Allergies    Penicillins  Review of Systems   Review of Systems  Constitutional: Positive for chills, fatigue and fever.  HENT: Positive for congestion and rhinorrhea.   Eyes: Negative for visual disturbance.  Respiratory: Positive for cough and shortness of breath.   Cardiovascular: Negative for chest pain.  Gastrointestinal: Positive for diarrhea, nausea and vomiting.  Genitourinary: Negative for dysuria.  Musculoskeletal: Positive for myalgias. Negative for neck stiffness.  Skin: Negative for rash.  Neurological: Positive for headaches.    Physical Exam Updated Vital Signs BP (!) 146/78 (BP Location: Left Arm)   Pulse (!) 109   Temp (!) 101 F (38.3 C) (Oral)   Resp 20   Ht '5\' 3"'  (1.6 m)   Wt (!) 106.6 kg   LMP 07/03/2020 (Approximate)   SpO2 100%   BMI 41.63 kg/m   Physical Exam Vitals and nursing note reviewed.  Constitutional:      General: She is not in acute distress.    Appearance: Normal appearance. She is well-developed.  HENT:     Head: Normocephalic and atraumatic.  Eyes:     Conjunctiva/sclera: Conjunctivae normal.  Neck:     Meningeal: Brudzinski's sign and Kernig's sign absent.  Cardiovascular:     Rate and Rhythm: Regular rhythm. Tachycardia present.     Heart sounds: No murmur heard.   Pulmonary:     Effort: Pulmonary effort is normal. No respiratory distress.     Breath sounds: Normal breath sounds. No stridor. No wheezing.  Abdominal:     Palpations: Abdomen is soft.     Tenderness: There is no abdominal tenderness.  Musculoskeletal:        General: No tenderness. Normal range of motion.     Cervical back: Normal range of motion and neck supple.     Right lower leg: No edema.       Left lower leg: No edema.  Skin:    General: Skin is warm and dry.     Capillary Refill: Capillary refill takes less than 2 seconds.  Neurological:     General: No focal deficit present.     Mental Status: She is alert.     GCS: GCS eye subscore is 4. GCS verbal subscore is 5. GCS motor subscore is 6.     ED Results / Procedures / Treatments   Labs (all labs ordered are listed, but only abnormal results are displayed) Labs Reviewed  SARS CORONAVIRUS 2 (Portal LAB) - Abnormal; Notable for the following components:      Result Value   SARS Coronavirus 2 POSITIVE (*)    All other components within normal limits  COMPREHENSIVE METABOLIC PANEL - Abnormal; Notable for the following components:   Sodium 133 (*)    CO2 21 (*)    All other components within normal limits  CBC WITH DIFFERENTIAL/PLATELET - Abnormal; Notable for the following components:   Hemoglobin 11.8 (*)    Monocytes Absolute 1.1 (*)    All other components within normal limits  URINALYSIS, ROUTINE W REFLEX MICROSCOPIC - Abnormal; Notable for the following components:   Ketones, ur 5 (*)    Protein, ur 100 (*)    Bacteria, UA RARE (*)    All other components within normal limits  CULTURE, BLOOD (ROUTINE X 2)  CULTURE, BLOOD (ROUTINE X 2)  URINE CULTURE  PROTIME-INR  LACTIC ACID, PLASMA  I-STAT BETA HCG  BLOOD, ED ([facility], [facility], [facility] ONLY)    EKG EKG Interpretation  Date/Time:  Sunday July 10 2020 14:22:42 EDT Ventricular Rate:  112 PR Interval:    QRS Duration: 85 QT Interval:  312 QTC Calculation: 426 R Axis:   49 Text Interpretation: Sinus tachycardia Probable left atrial enlargement Borderline T wave abnormalities 12 Lead; Mason-Likar nonspecific t wave changes from prior 1/21 Confirmed by Aletta Edouard [phone]) on 07/10/2020 2:40:59 PM   Radiology DG Chest Port 1 View  Result Date: 07/10/2020 CLINICAL DATA:  Headache, fever, body aches EXAM: PORTABLE CHEST 1  VIEW COMPARISON:  01/03/2018 FINDINGS: The heart size and mediastinal contours are within normal limits. Both lungs are clear. The visualized skeletal structures are unremarkable. IMPRESSION: No active disease. Electronically Signed   By: Fidela Salisbury MD   On: 07/10/2020 15:25    Procedures Procedures (including critical care time)  Medications Ordered in ED Medications  sodium chloride 0.9 % bolus 1,000 mL (0 mLs Intravenous Stopped 07/10/20 1752)  acetaminophen (TYLENOL) tablet 650 mg (650 mg Oral Given 07/10/20 1548)  morphine 4 MG/ML injection 4 mg (4 mg Intravenous Given 07/10/20 1649)    ED Course  I have reviewed the triage vital signs and the nursing notes.  Pertinent labs & imaging results that were available during my care of the patient were reviewed by me and considered in my medical decision making (see chart for details).  Clinical Course as of Jul 11 1142  Sun Jul 10, 2020  1505 Chest x-ray interpreted by me as no acute infiltrates.  Awaiting radiology reading.   [MB]  [place] Patient's lab work came back fairly unremarkable.  Tachycardia has resolved with some fluids.  Covid testing is positive.  Will place consult into the [facility] clinic.  Reviewed discharge instructions with the patient.  Return instructions discussed.   [MB]  3300 Placed a referral into the [facility] infusion center.  They will reach out to the patient tomorrow.   [MB]    Clinical Course User Index [MB] Hayden Rasmussen, MD   MDM Rules/Calculators/A&P                         Amy Yoder was evaluated in Emergency Department on 07/10/2020 for the symptoms described in the history of present illness. She was evaluated in the context of the global COVID-19 pandemic, which necessitated consideration that the patient might be at risk for infection with the SARS-CoV-2 virus that causes COVID-19. Institutional protocols and algorithms that pertain to the evaluation of patients at risk for COVID-19 are in a state of  rapid change based on information released by regulatory bodies including the CDC and federal and state organizations. These policies and algorithms were followed during the patient's care in the ED.  This patient complains of fevers chills cough body aches diarrhea; this involves an extensive number of treatment Options and is a complaint that carries with it a high risk of complications and Morbidity. The differential includes Covid, flu, viral syndrome, pneumonia, metabolic derangement, sepsis  I ordered, reviewed and interpreted labs, which included CBC with normal white count, hemoglobin stable, chemistries with mildly low sodium and bicarb reflecting some dehydration, pregnancy test negative, Covid testing positive I ordered medication IV fluids Tylenol nausea medication pain medication I ordered imaging studies which included chest x-ray and I independently    visualized and interpreted imaging which showed no acute infiltrates Previous records obtained and reviewed in epic, no recent  admissions  After the interventions stated above, I reevaluated the patient and found patient's tachycardia to be improved.  She remains miserable with body aches but oxygenation good.  Have placed a referral into the infusion center.  Currently I do not feel she requires inpatient admission.  She is comfortable with plan for outpatient isolation and follow-up with the infusion center.  Return instructions discussed.   Final Clinical Impression(s) / ED Diagnoses Final diagnoses:  COVID-19 virus infection    Rx / DC Orders ED Discharge Orders    None       Hayden Rasmussen, MD 07/11/20 1146

## 2020-07-10 NOTE — ED Triage Notes (Signed)
Pt presents with c/o headache, fever, body aches. Pt reports that she was recently around her friend who tested positive for Covid. Pt reports very close contact with this friend. Pt is a cancer patient, not undergoing chemo at this time, cancer is in remission. Pt reports that she is feeling very weak and unable to eat anything.

## 2020-07-10 NOTE — Discharge Instructions (Signed)
You were seen in the emergency department for 2 days of fevers chills body aches cough nasal congestion.  You had lab work done that showed you were positive for Covid.  Your chest x-ray did not show any signs of pneumonia and your oxygen level was good.  Please monitor your fever and treat with Tylenol and ibuprofen.  If you have access to check your oxygen please monitor your oxygen saturations.  Return to the emergency department for any worsening or concerning symptoms.  You will need to isolate for at least 10 days after symptom onset and at least 24 hours after your last fever.

## 2020-07-11 ENCOUNTER — Telehealth: Payer: Self-pay | Admitting: Adult Health

## 2020-07-11 LAB — URINE CULTURE

## 2020-07-11 NOTE — Telephone Encounter (Signed)
Called patient to discuss monoclonal antibody treatment for patients at risk for complications and/or hospitalization from [place] virus.  Patient is at risk due to: BMI greater than 25, colon cancer, HTN.    I could not reach her, and her voice mail was not set up.  Unable to send my chart message.    Wilber Bihari, NP

## 2020-07-15 LAB — CULTURE, BLOOD (ROUTINE X 2)
Culture: NO GROWTH
Culture: NO GROWTH
Special Requests: ADEQUATE

## 2020-07-20 ENCOUNTER — Telehealth: Payer: Self-pay

## 2020-07-20 NOTE — Telephone Encounter (Signed)
Pt called to report that starting today she had clumps of blood mixed with bowel movement.  She has had colon surgery several months ago.  Pt denies blood coming from vagina.  She has also had Covid and reports she is just now getting over and is concerned this blood has something to do with bleeding.  Per NP, pt should contact her GI surgeon to have this checked out.  Pt was given this information and voiced understanding.

## 2020-09-22 ENCOUNTER — Telehealth: Payer: Self-pay | Admitting: *Deleted

## 2020-09-22 NOTE — Telephone Encounter (Signed)
Called the patient and explained that we need to cancel for 10/13. Explained that the appt is canceled due to Dr Berline Lopes broke her leg. Explained that the office will all her back once we can schedule appts

## 2020-09-28 ENCOUNTER — Ambulatory Visit: Payer: Medicaid Other | Admitting: Gynecologic Oncology

## 2020-10-05 ENCOUNTER — Ambulatory Visit: Payer: Medicaid Other | Admitting: Obstetrics

## 2020-10-20 ENCOUNTER — Telehealth: Payer: Self-pay | Admitting: *Deleted

## 2020-10-20 NOTE — Telephone Encounter (Signed)
Called the patient and scheduled an appt for 11/12

## 2020-10-24 ENCOUNTER — Other Ambulatory Visit: Payer: Self-pay

## 2020-10-27 ENCOUNTER — Encounter: Payer: Self-pay | Admitting: Gynecologic Oncology

## 2020-10-27 NOTE — Progress Notes (Signed)
Gynecologic Oncology Return Clinic Visit  10/28/20  Reason for Visit: screening in the setting of Lynch syndrome  Treatment History: Oncology History  Colon cancer ([place])  02/02/2020 Initial Diagnosis   Colon cancer ([place])   03/10/2020 Genetic Testing   Positive genetic testing:  A single pathogenic variant was detected in the MLH1 gene, called c.1381A>T (p.Lys461*), through the [facility] Common Hereditary Cancers panel. A variant of uncertain significance was also detected in the MEN1 gene, called c.511C>T (p.Arg171Trp). The report date is 03/10/2020.  The Common Hereditary Cancers Panel offered by [facility] includes sequencing and/or deletion duplication testing of the following 48 genes: APC, ATM, AXIN2, BARD1, BMPR1A, BRCA1, BRCA2, BRIP1, CDH1, CDK4, CDKN2A (p14ARF), CDKN2A (p16INK4a), CHEK2, CTNNA1, DICER1, EPCAM (Deletion/duplication testing only), GREM1 (promoter region deletion/duplication testing only), KIT, MEN1, MLH1, MSH2, MSH3, MSH6, MUTYH, NBN, NF1, NHTL1, PALB2, PDGFRA, PMS2, POLD1, POLE, PTEN, RAD50, RAD51C, RAD51D, RNF43, SDHB, SDHC, SDHD, SMAD4, SMARCA4. STK11, TP53, TSC1, TSC2, and VHL.  The following genes were evaluated for sequence changes only: SDHA and HOXB13 c.251G>A variant only.     Interval History: Patient presents with her aunt, Amy Yoder, today. She is overall doing well. She denies any abnormal bleeding since I last saw her, just menses shorter last month (2 days) than normal.  She underwent repeat colonoscopy recently with normal findings per her report. Bowels have been soft, hard, loose; stool is frequently different colors.  [name] is frustrated that she has not been able to lose weight. She continues to be very active because of her job. Reports "eating well". Concerned that Seroquel (which she has been on for 17 years) is causing her inability to lose weight).  Past Medical/Surgical History: Past Medical History:  Diagnosis Date  . Bipolar I disorder ([place])   .  Bronchitis   . Colon cancer ([place])   . Depression   . Family history of brain cancer   . Family history of colon cancer   . Family history of lung cancer   . Family history of non-Hodgkin's lymphoma   . Family history of stomach cancer   . Hypertension 02/15/2019  . Lynch syndrome   . Marijuana use   . Symptomatic cholelithiasis s/p lap chole 03/12/2014 03/11/2014  . Tobacco use     Past Surgical History:  Procedure Laterality Date  . CHOLECYSTECTOMY N/A 03/12/2014   Procedure: LAPAROSCOPIC CHOLECYSTECTOMY WITH INTRAOPERATIVE CHOLANGIOGRAM;  Surgeon: Merrie Roof, MD;  Location: [facility];  Service: General;  Laterality: N/A;  . DENTAL SURGERY    . DILATION AND CURETTAGE OF UTERUS     x2 for EAB  . FLEXIBLE SIGMOIDOSCOPY N/A 01/08/2020   Procedure: FLEXIBLE SIGMOIDOSCOPY;  Surgeon: Carol Ada, MD;  Location: [facility];  Service: Endoscopy;  Laterality: N/A;  . HEMOSTASIS CLIP PLACEMENT  01/08/2020   Procedure: HEMOSTASIS CLIP PLACEMENT;  Surgeon: Carol Ada, MD;  Location: [facility];  Service: Endoscopy;;  . POLYPECTOMY  01/08/2020   Procedure: POLYPECTOMY;  Surgeon: Carol Ada, MD;  Location: [facility];  Service: Endoscopy;;  . Clide Deutscher  01/08/2020   Procedure: Clide Deutscher;  Surgeon: Carol Ada, MD;  Location: [facility];  Service: Endoscopy;;  . SUBMUCOSAL TATTOO INJECTION  01/08/2020   Procedure: SUBMUCOSAL TATTOO INJECTION;  Surgeon: Carol Ada, MD;  Location: [facility];  Service: Endoscopy;;    Family History  Problem Relation Age of Onset  . Hypertension Mother   . Non-Hodgkin's lymphoma Mother 46  . Cirrhosis Maternal Grandfather        cirrhosis of the  liver  . Lung cancer Paternal Grandfather        dx. late 6s  . Brain cancer Other        dx. >50, unsure if primary or metastasis  . Stomach cancer Other        dx. >50, unsure if primary or metastasis  . Lung cancer Other        dx. >50, unsure if primary or metastasis  . Colon cancer  Maternal Great-grandmother        dx. 77s    Social History   Socioeconomic History  . Marital status: Single    Spouse name: Not on file  . Number of children: Not on file  . Years of education: Not on file  . Highest education level: Not on file  Occupational History  . Not on file  Tobacco Use  . Smoking status: Current Every Day Smoker    Packs/day: 0.50    Types: Cigarettes  . Smokeless tobacco: Never Used  Vaping Use  . Vaping Use: Never used  Substance and Sexual Activity  . Alcohol use: Yes    Comment: 2x/week  . Drug use: Yes    Comment: occasional Marijuana use  . Sexual activity: Yes    Birth control/protection: Condom  Other Topics Concern  . Not on file  Social History Narrative  . Not on file   Social Determinants of Health   Financial Resource Strain:   . Difficulty of Paying Living Expenses: Not on file  Food Insecurity:   . Worried About Charity fundraiser in the Last Year: Not on file  . Ran Out of Food in the Last Year: Not on file  Transportation Needs:   . Lack of Transportation (Medical): Not on file  . Lack of Transportation (Non-Medical): Not on file  Physical Activity:   . Days of Exercise per Week: Not on file  . Minutes of Exercise per Session: Not on file  Stress:   . Feeling of Stress : Not on file  Social Connections:   . Frequency of Communication with Friends and Family: Not on file  . Frequency of Social Gatherings with Friends and Family: Not on file  . Attends Religious Services: Not on file  . Active Member of Clubs or Organizations: Not on file  . Attends Archivist Meetings: Not on file  . Marital Status: Not on file    Current Medications:  Current Outpatient Medications:  .  gabapentin (NEURONTIN) 400 MG capsule, Take 400 mg by mouth 3 (three) times daily., Disp: , Rfl:  .  ibuprofen (ADVIL) 200 MG tablet, Take 400 mg by mouth every 6 (six) hours as needed for headache or moderate pain., Disp: , Rfl:  .   lamoTRIgine (LAMICTAL) 200 MG tablet, Take 200 mg by mouth daily., Disp: , Rfl:  .  omeprazole (PRILOSEC) 20 MG capsule, Take 20 mg by mouth daily., Disp: , Rfl:  .  QUEtiapine (SEROQUEL) 400 MG tablet, Take 1 tablet (400 mg total) by mouth daily. Pt takes at bedtime with $RemoveBefo'50mg'uQTiKsVoRoc$  for a total dose of $Remov'450mg'qePDXo$  (Patient taking differently: Take 400 mg by mouth at bedtime. Pt takes at bedtime with $RemoveBefo'50mg'WtiucMDzCxp$  for a total dose of $Remov'450mg'UVYmpS$ ), Disp: 30 tablet, Rfl: 0 .  QUEtiapine (SEROQUEL) 50 MG tablet, Take 1 tablet (50 mg total) by mouth at bedtime. Pt takes at bedtime with $RemoveBefo'400mg'mYoDKnDsfrk$  tablet for total dose of $Remov'450mg'OyApOi$ , Disp: 30 tablet, Rfl: 0 .  sertraline (ZOLOFT) 100 MG  tablet, Take 2 tablets (200 mg total) by mouth daily., Disp: 60 tablet, Rfl: 0  Review of Systems: Pertinent positives as per HPI. Denies appetite changes, fevers, chills, fatigue, unexplained weight changes. Denies hearing loss, neck lumps or masses, mouth sores, ringing in ears or voice changes. Denies cough or wheezing.  Denies shortness of breath. Denies chest pain or palpitations. Denies leg swelling. Denies abdominal distention, pain, blood in stools, constipation, diarrhea, nausea, vomiting, or early satiety. Denies pain with intercourse, dysuria, frequency, hematuria or incontinence. Denies hot flashes, pelvic pain, vaginal bleeding or vaginal discharge.   Denies joint pain, back pain or muscle pain/cramps. Denies itching, rash, or wounds. Denies dizziness, headaches, numbness or seizures. Denies swollen lymph nodes or glands, denies easy bruising or bleeding. Denies anxiety, depression, confusion, or decreased concentration.  Physical Exam: BP (!) 142/82 (BP Location: Left Arm, Patient Position: Sitting)   Pulse 84   Temp 98.2 F (36.8 C) (Tympanic)   Resp 20   Ht $R'5\' 3"'uz$  (1.6 m)   Wt 236 lb (107 kg)   SpO2 100% Comment: RA  BMI 41.81 kg/m  General: Alert, oriented, no acute distress. HEENT: Atraumatic, normocephalic, sclera  anicteric. Chest: Unlabored breathing on room air.  Laboratory & Radiologic Studies: None new  Assessment & Plan: Amy Yoder is a 34 y.o. 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   Shanetta brought her aunt to today's appointment for a discussion about Lynch syndrome. I spent a good deal of time reviewing again her early-stage colon cancer diagnosis, the MLH1 gene mutation that she harbors, the other cancers that she is at risk for developing, and the screening that we will perform from a gyn cancer standpoint (uterine) until ready to move forward with risk-reducing surgery. We reviewed her EMB from May that was without hyperplasia or malignancy. We discussed symptoms (notably menstrual changes or intermenstrual bleeding) that should prompt a call to me sooner than her next scheduled visit.   In terms of surgery, we had previously discussed the NCCN recommendations for hysterectomy. Depending on when we move forward with hysterectomy, we could proceed with BS or BSO. If surgery occurs sooner, I would recommend a staged procedure and leaving her ovaries in situ with the plan to remove closer to age 33.   I spent about 15 minutes solely discussing weight loss and nutrition. While Amy Yoder continues to endorse making good food choices, she has yet to keep a food diary that we can review. She also continues to drink large quantities of Kool Aid and sweet tea. I suspect that she is consuming >1000 calories just with what she is drinking. I asked her to work on a food diary for the net 2 weeks and she will then review this with her aunt to identify one of two things that may be contributing to excess calories. I suspect that she would be able to start losing weight easily if she cut down or cut out her calorie-rich beverages. We discussed her calorie goal based on her current weight and I showed her several online tools and applications (Lose It! App which  she can use for food diary entry) to help her stay on track with her goals.   She would like to follow-up in 3 months to check in about her weight loss progress.  45 minutes of total time was spent for this patient encounter, including preparation, face-to-face counseling with the patient and coordination of care, and documentation of the  encounter.  Jeral Pinch, MD  Division of Gynecologic Oncology  Department of Obstetrics and Gynecology  [facility]

## 2020-10-28 ENCOUNTER — Inpatient Hospital Stay: Payer: Medicaid Other | Attending: Gynecologic Oncology | Admitting: Gynecologic Oncology

## 2020-10-28 ENCOUNTER — Other Ambulatory Visit: Payer: Self-pay

## 2020-10-28 ENCOUNTER — Encounter: Payer: Self-pay | Admitting: Gynecologic Oncology

## 2020-10-28 VITALS — BP 142/82 | HR 84 | Temp 98.2°F | Resp 20 | Ht 63.0 in | Wt 236.0 lb

## 2020-10-28 DIAGNOSIS — F17210 Nicotine dependence, cigarettes, uncomplicated: Secondary | ICD-10-CM | POA: Insufficient documentation

## 2020-10-28 DIAGNOSIS — Z800 Family history of malignant neoplasm of digestive organs: Secondary | ICD-10-CM | POA: Diagnosis not present

## 2020-10-28 DIAGNOSIS — Z1504 Genetic susceptibility to malignant neoplasm of endometrium: Secondary | ICD-10-CM

## 2020-10-28 DIAGNOSIS — Z808 Family history of malignant neoplasm of other organs or systems: Secondary | ICD-10-CM | POA: Insufficient documentation

## 2020-10-28 DIAGNOSIS — Z79899 Other long term (current) drug therapy: Secondary | ICD-10-CM | POA: Insufficient documentation

## 2020-10-28 DIAGNOSIS — Z801 Family history of malignant neoplasm of trachea, bronchus and lung: Secondary | ICD-10-CM | POA: Diagnosis not present

## 2020-10-28 DIAGNOSIS — I10 Essential (primary) hypertension: Secondary | ICD-10-CM | POA: Diagnosis not present

## 2020-10-28 DIAGNOSIS — C189 Malignant neoplasm of colon, unspecified: Secondary | ICD-10-CM | POA: Insufficient documentation

## 2020-10-28 DIAGNOSIS — F319 Bipolar disorder, unspecified: Secondary | ICD-10-CM | POA: Insufficient documentation

## 2020-10-28 DIAGNOSIS — Z1509 Genetic susceptibility to other malignant neoplasm: Secondary | ICD-10-CM | POA: Insufficient documentation

## 2020-10-28 DIAGNOSIS — Z85038 Personal history of other malignant neoplasm of large intestine: Secondary | ICD-10-CM

## 2020-10-28 DIAGNOSIS — Z6841 Body Mass Index (BMI) 40.0 and over, adult: Secondary | ICD-10-CM

## 2020-10-28 DIAGNOSIS — Z807 Family history of other malignant neoplasms of lymphoid, hematopoietic and related tissues: Secondary | ICD-10-CM | POA: Diagnosis not present

## 2020-10-28 NOTE — Patient Instructions (Signed)
Plan to follow up in three months to evaluate your weight loss progress. You will need to call the office at [phone] in 2-3 weeks to schedule your appt for Feb 2022. Please let me know if there are any changes to your bleeding before then.  Lynch Syndrome  Lynch syndrome, also called hereditary nonpolyposis colorectal cancer (HNPCC), is a condition that increases a person's risk for developing colorectal cancer before age 36. Lynch syndrome can also increase a person's risk for many other types of cancer, including stomach, small intestine, liver, gallbladder, pancreas, urinary tract, skin, and brain cancers. Women with this condition have a higher risk for developing cancer of the ovaries and cancer in the lining of the uterus (endometrium). What are the causes? This condition is caused by a gene mutation that is inherited from one or both parents. A gene mutation is a harmful change in a gene. Not everyone who inherits the genetic mutation develops cancer. What are the signs or symptoms? There are no symptoms of this condition. However, your health care provider may test you for Lynch syndrome if you:  Have colorectal cancer before age 49.  Have family members diagnosed with colorectal, endometrial, or other types of cancer. How is this diagnosed? This condition is diagnosed with:  A review of your family history of cancer.  A blood test to look for the mutations that cause this condition.  Testing of tumor tissue (biopsy). How is this treated? This condition may be managed with:  Genetic counseling to assess your risk and your options for management.  Regular screening tests for the associated cancers. You may need to have a colonoscopy every 1-2 years starting at an early age.  Daily aspirin therapy.  Preventive surgery to remove sites where cancer can develop, such as the colon, uterus, and ovaries. Follow these instructions at home:   Ask your health care provider about  your risks.  Discuss a referral for genetic counseling. Ask about the risks and benefits of genetic counseling.  Write down any questions you have about your condition.  Follow your plan for cancer screenings as told by your health care provider.  Take over-the-counter and prescription medicines only as told by your health care provider.  Maintain a healthy diet.  Consider joining a support group. This may help you learn to cope with the stress of having Lynch syndrome.  Keep all follow-up visits as told by your health care provider. This is important. Contact a health care provider if:  You develop any new or unusual symptoms.  You develop symptoms of colorectal cancer, such as: ? Blood in the stool. ? Changes in bowel habits. ? Abdominal pain or bloating. ? Unexplained weight loss. Summary  Lynch syndrome is caused by an inherited gene mutation. Not everyone who inherits this mutation will develop cancer.  Genetic counseling and blood testing for Lynch syndrome can identify people with the condition.  Regular cancer screening tests are important in managing this condition. This information is not intended to replace advice given to you by your health care provider. Make sure you discuss any questions you have with your health care provider. Document Revised: 08/02/2016 Document Reviewed: 07/27/2016 Elsevier Patient Education  2020 [place] [employer]  Calories are units of energy. Your body needs a certain amount of calories from food to keep you going throughout the day. When you eat more calories than your body needs, your body stores the extra calories as fat. When you  eat fewer calories than your body needs, your body burns fat to get the energy it needs. Calorie counting means keeping track of how many calories you eat and drink each day. Calorie counting can be helpful if you need to lose weight. If you make sure to eat fewer calories than  your body needs, you should lose weight. Ask your health care provider what a healthy weight is for you. For calorie counting to work, you will need to eat the right number of calories in a day in order to lose a healthy amount of weight per week. A dietitian can help you determine how many calories you need in a day and will give you suggestions on how to reach your calorie goal.  A healthy amount of weight to lose per week is usually 1-2 lb (0.5-0.9 kg). This usually means that your daily calorie intake should be reduced by 500-750 calories.  Eating 1,200 - 1,500 calories per day can help most women lose weight.  Eating 1,500 - 1,800 calories per day can help most men lose weight. What is my plan? My goal is to have __________ calories per day. If I have this many calories per day, I should lose around __________ pounds per week. What do I need to know about calorie counting? In order to meet your daily calorie goal, you will need to:  Find out how many calories are in each food you would like to eat. Try to do this before you eat.  Decide how much of the food you plan to eat.  Write down what you ate and how many calories it had. Doing this is called keeping a food log. To successfully lose weight, it is important to balance calorie counting with a healthy lifestyle that includes regular activity. Aim for 150 minutes of moderate exercise (such as walking) or 75 minutes of vigorous exercise (such as running) each week. Where do I find calorie information?  The number of calories in a food can be found on a Nutrition Facts label. If a food does not have a Nutrition Facts label, try to look up the calories online or ask your dietitian for help. Remember that calories are listed per serving. If you choose to have more than one serving of a food, you will have to multiply the calories per serving by the amount of servings you plan to eat. For example, the label on a package of bread might say  that a serving size is 1 slice and that there are 90 calories in a serving. If you eat 1 slice, you will have eaten 90 calories. If you eat 2 slices, you will have eaten 180 calories. How do I keep a food log? Immediately after each meal, record the following information in your food log:  What you ate. Don't forget to include toppings, sauces, and other extras on the food.  How much you ate. This can be measured in cups, ounces, or number of items.  How many calories each food and drink had.  The total number of calories in the meal. Keep your food log near you, such as in a small notebook in your pocket, or use a mobile app or website. Some programs will calculate calories for you and show you how many calories you have left for the day to meet your goal. What are some calorie counting tips?   Use your calories on foods and drinks that will fill you up and not leave you hungry: ?  Some examples of foods that fill you up are nuts and nut butters, vegetables, lean proteins, and high-fiber foods like whole grains. High-fiber foods are foods with more than 5 g fiber per serving. ? Drinks such as sodas, specialty coffee drinks, alcohol, and juices have a lot of calories, yet do not fill you up.  Eat nutritious foods and avoid empty calories. Empty calories are calories you get from foods or beverages that do not have many vitamins or protein, such as candy, sweets, and soda. It is better to have a nutritious high-calorie food (such as an avocado) than a food with few nutrients (such as a bag of chips).  Know how many calories are in the foods you eat most often. This will help you calculate calorie counts faster.  Pay attention to calories in drinks. Low-calorie drinks include water and unsweetened drinks.  Pay attention to nutrition labels for "low fat" or "fat free" foods. These foods sometimes have the same amount of calories or more calories than the full fat versions. They also often have  added sugar, starch, or salt, to make up for flavor that was removed with the fat.  Find a way of tracking calories that works for you. Get creative. Try different apps or programs if writing down calories does not work for you. What are some portion control tips?  Know how many calories are in a serving. This will help you know how many servings of a certain food you can have.  Use a measuring cup to measure serving sizes. You could also try weighing out portions on a kitchen scale. With time, you will be able to estimate serving sizes for some foods.  Take some time to put servings of different foods on your favorite plates, bowls, and cups so you know what a serving looks like.  Try not to eat straight from a bag or box. Doing this can lead to overeating. Put the amount you would like to eat in a cup or on a plate to make sure you are eating the right portion.  Use smaller plates, glasses, and bowls to prevent overeating.  Try not to multitask (for example, watch TV or use your computer) while eating. If it is time to eat, sit down at a table and enjoy your food. This will help you to know when you are full. It will also help you to be aware of what you are eating and how much you are eating. What are tips for following this plan? Reading food labels  Check the calorie count compared to the serving size. The serving size may be smaller than what you are used to eating.  Check the source of the calories. Make sure the food you are eating is high in vitamins and protein and low in saturated and trans fats. Shopping  Read nutrition labels while you shop. This will help you make healthy decisions before you decide to purchase your food.  Make a grocery list and stick to it. Cooking  Try to cook your favorite foods in a healthier way. For example, try baking instead of frying.  Use low-fat dairy products. Meal planning  Use more fruits and vegetables. Half of your plate should be  fruits and vegetables.  Include lean proteins like poultry and fish. How do I count calories when eating out?  Ask for smaller portion sizes.  Consider sharing an entree and sides instead of getting your own entree.  If you get your own entree, eat  only half. Ask for a box at the beginning of your meal and put the rest of your entree in it so you are not tempted to eat it.  If calories are listed on the menu, choose the lower calorie options.  Choose dishes that include vegetables, fruits, whole grains, low-fat dairy products, and lean protein.  Choose items that are boiled, broiled, grilled, or steamed. Stay away from items that are buttered, battered, fried, or served with cream sauce. Items labeled "crispy" are usually fried, unless stated otherwise.  Choose water, low-fat milk, unsweetened iced tea, or other drinks without added sugar. If you want an alcoholic beverage, choose a lower calorie option such as a glass of wine or light beer.  Ask for dressings, sauces, and syrups on the side. These are usually high in calories, so you should limit the amount you eat.  If you want a salad, choose a garden salad and ask for grilled meats. Avoid extra toppings like bacon, cheese, or fried items. Ask for the dressing on the side, or ask for olive oil and vinegar or lemon to use as dressing.  Estimate how many servings of a food you are given. For example, a serving of cooked rice is  cup or about the size of half a baseball. Knowing serving sizes will help you be aware of how much food you are eating at restaurants. The list below tells you how big or small some common portion sizes are based on everyday objects: ? 1 oz--4 stacked dice. ? 3 oz--1 deck of cards. ? 1 tsp--1 die. ? 1 Tbsp-- a ping-pong ball. ? 2 Tbsp--1 ping-pong ball. ?  cup-- baseball. ? 1 cup--1 baseball. Summary  Calorie counting means keeping track of how many calories you eat and drink each day. If you eat fewer  calories than your body needs, you should lose weight.  A healthy amount of weight to lose per week is usually 1-2 lb (0.5-0.9 kg). This usually means reducing your daily calorie intake by 500-750 calories.  The number of calories in a food can be found on a Nutrition Facts label. If a food does not have a Nutrition Facts label, try to look up the calories online or ask your dietitian for help.  Use your calories on foods and drinks that will fill you up, and not on foods and drinks that will leave you hungry.  Use smaller plates, glasses, and bowls to prevent overeating. This information is not intended to replace advice given to you by your health care provider. Make sure you discuss any questions you have with your health care provider. Document Revised: 08/22/2018 Document Reviewed: 11/02/2016 Elsevier Patient Education  2020 [employer].   Exercising to Lose Weight Exercise is structured, repetitive physical activity to improve fitness and health. Getting regular exercise is important for everyone. It is especially important if you are overweight. Being overweight increases your risk of heart disease, stroke, diabetes, high blood pressure, and several types of cancer. Reducing your calorie intake and exercising can help you lose weight. Exercise is usually categorized as moderate or vigorous intensity. To lose weight, most people need to do a certain amount of moderate-intensity or vigorous-intensity exercise each week. Moderate-intensity exercise  Moderate-intensity exercise is any activity that gets you moving enough to burn at least three times more energy (calories) than if you were sitting. Examples of moderate exercise include:  Walking a mile in 15 minutes.  Doing light yard work.  Biking at an easy  pace. Most people should get at least 150 minutes (2 hours and 30 minutes) a week of moderate-intensity exercise to maintain their body weight. Vigorous-intensity  exercise Vigorous-intensity exercise is any activity that gets you moving enough to burn at least six times more calories than if you were sitting. When you exercise at this intensity, you should be working hard enough that you are not able to carry on a conversation. Examples of vigorous exercise include:  Running.  Playing a team sport, such as football, basketball, and soccer.  Jumping rope. Most people should get at least 75 minutes (1 hour and 15 minutes) a week of vigorous-intensity exercise to maintain their body weight. How can exercise affect me? When you exercise enough to burn more calories than you eat, you lose weight. Exercise also reduces body fat and builds muscle. The more muscle you have, the more calories you burn. Exercise also:  Improves mood.  Reduces stress and tension.  Improves your overall fitness, flexibility, and endurance.  Increases bone strength. The amount of exercise you need to lose weight depends on:  Your age.  The type of exercise.  Any health conditions you have.  Your overall physical ability. Talk to your health care provider about how much exercise you need and what types of activities are safe for you. What actions can I take to lose weight? Nutrition   Make changes to your diet as told by your health care provider or diet and nutrition specialist (dietitian). This may include: ? Eating fewer calories. ? Eating more protein. ? Eating less unhealthy fats. ? Eating a diet that includes fresh fruits and vegetables, whole grains, low-fat dairy products, and lean protein. ? Avoiding foods with added fat, salt, and sugar.  Drink plenty of water while you exercise to prevent dehydration or heat stroke. Activity  Choose an activity that you enjoy and set realistic goals. Your health care provider can help you make an exercise plan that works for you.  Exercise at a moderate or vigorous intensity most days of the week. ? The intensity of  exercise may vary from person to person. You can tell how intense a workout is for you by paying attention to your breathing and heartbeat. Most people will notice their breathing and heartbeat get faster with more intense exercise.  Do resistance training twice each week, such as: ? Push-ups. ? Sit-ups. ? Lifting weights. ? Using resistance bands.  Getting short amounts of exercise can be just as helpful as long structured periods of exercise. If you have trouble finding time to exercise, try to include exercise in your daily routine. ? Get up, stretch, and walk around every 30 minutes throughout the day. ? Go for a walk during your lunch break. ? Park your car farther away from your destination. ? If you take public transportation, get off one stop early and walk the rest of the way. ? Make phone calls while standing up and walking around. ? Take the stairs instead of elevators or escalators.  Wear comfortable clothes and shoes with good support.  Do not exercise so much that you hurt yourself, feel dizzy, or get very short of breath. Where to find more information  [place] Department of Health and Human Services: [employer]  Centers for Disease Control and Prevention (CDC): http://www.wolf.info/ Contact a health care provider:  Before starting a new exercise program.  If you have questions or concerns about your weight.  If you have a medical problem that keeps you from  exercising. Get help right away if you have any of the following while exercising:  Injury.  Dizziness.  Difficulty breathing or shortness of breath that does not go away when you stop exercising.  Chest pain.  Rapid heartbeat. Summary  Being overweight increases your risk of heart disease, stroke, diabetes, high blood pressure, and several types of cancer.  Losing weight happens when you burn more calories than you eat.  Reducing the amount of calories you eat in addition to getting regular moderate or vigorous  exercise each week helps you lose weight. This information is not intended to replace advice given to you by your health care provider. Make sure you discuss any questions you have with your health care provider. Document Revised: 12/16/2017 Document Reviewed: 12/16/2017 Elsevier Patient Education  2020 [employer].

## 2021-01-05 ENCOUNTER — Other Ambulatory Visit (HOSPITAL_COMMUNITY)
Admission: RE | Admit: 2021-01-05 | Discharge: 2021-01-05 | Disposition: A | Discharge status: Home / Self Care | Payer: Medicaid Other | Source: Ambulatory Visit | Attending: Obstetrics | Admitting: Obstetrics

## 2021-01-05 ENCOUNTER — Ambulatory Visit (INDEPENDENT_AMBULATORY_CARE_PROVIDER_SITE_OTHER): Payer: Medicaid Other | Admitting: Obstetrics

## 2021-01-05 ENCOUNTER — Encounter: Payer: Self-pay | Admitting: Obstetrics

## 2021-01-05 ENCOUNTER — Other Ambulatory Visit: Payer: Self-pay

## 2021-01-05 VITALS — BP 118/78 | HR 85 | Ht 63.0 in | Wt 231.0 lb

## 2021-01-05 DIAGNOSIS — Z01419 Encounter for gynecological examination (general) (routine) without abnormal findings: Secondary | ICD-10-CM | POA: Insufficient documentation

## 2021-01-05 DIAGNOSIS — Z113 Encounter for screening for infections with a predominantly sexual mode of transmission: Secondary | ICD-10-CM

## 2021-01-05 DIAGNOSIS — Z3009 Encounter for other general counseling and advice on contraception: Secondary | ICD-10-CM

## 2021-01-05 DIAGNOSIS — N898 Other specified noninflammatory disorders of vagina: Secondary | ICD-10-CM

## 2021-01-05 DIAGNOSIS — Z6841 Body Mass Index (BMI) 40.0 and over, adult: Secondary | ICD-10-CM

## 2021-01-05 NOTE — Progress Notes (Signed)
Patient presents for Annual Exam. Pt had colon cancer had surgery to have polyps removed.    LMP:12/2020 Last Pap: per pt last tme shewas here in office. For Annual. Contraception: None and none is desired at this time  STD Screening: Full Panel  Family Hx of Breast Cancer: None   CC: None

## 2021-01-05 NOTE — Progress Notes (Signed)
Subjective:        Amy Yoder is a 35 y.o. female here for a routine exam.  Current complaints: None.    Personal health questionnaire:  Is patient Amy Yoder, have a family history of breast and/or ovarian cancer: no Is there a family history of uterine cancer diagnosed at age < 65, gastrointestinal cancer, urinary tract cancer, family member who is a Field seismologist syndrome-associated carrier: yes Is the patient overweight and hypertensive, family history of diabetes, personal history of gestational diabetes, preeclampsia or PCOS: no Is patient over 34, have PCOS,  family history of premature CHD under age 69, diabetes, smoke, have hypertension or peripheral artery disease:  no At any time, has a partner hit, kicked or otherwise hurt or frightened you?: no Over the past 2 weeks, have you felt down, depressed or hopeless?: no Over the past 2 weeks, have you felt little interest or pleasure in doing things?:no   Gynecologic History Patient's last menstrual period was 12/28/2020. Contraception: none Last Pap: unknown. Results were: normal Last mammogram: n/a. Results were: n/a  Obstetric History OB History  Gravida Para Term Preterm AB Living  5 1 1   4 1   SAB IAB Ectopic Multiple Live Births  2 2     1     # Outcome Date GA Lbr Len/2nd Weight Sex Delivery Anes PTL Lv  5 SAB           4 SAB           3 IAB           2 IAB           1 Term      Vag-Spont       Past Medical History:  Diagnosis Date  . Bipolar I disorder ([place])   . Bronchitis   . Colon cancer ([place])   . Depression   . Family history of brain cancer   . Family history of colon cancer   . Family history of lung cancer   . Family history of non-Hodgkin's lymphoma   . Family history of stomach cancer   . Hypertension 02/15/2019  . Lynch syndrome   . Marijuana use   . Symptomatic cholelithiasis s/p lap chole 03/12/2014 03/11/2014  . Tobacco use     Past Surgical History:  Procedure Laterality Date  .  CHOLECYSTECTOMY N/A 03/12/2014   Procedure: LAPAROSCOPIC CHOLECYSTECTOMY WITH INTRAOPERATIVE CHOLANGIOGRAM;  Surgeon: Merrie Roof, MD;  Location: [facility];  Service: General;  Laterality: N/A;  . DENTAL SURGERY    . DILATION AND CURETTAGE OF UTERUS     x2 for EAB  . FLEXIBLE SIGMOIDOSCOPY N/A 01/08/2020   Procedure: FLEXIBLE SIGMOIDOSCOPY;  Surgeon: Carol Ada, MD;  [facility];  Service: Endoscopy;  Laterality: N/A;  . HEMOSTASIS CLIP PLACEMENT  01/08/2020   Procedure: HEMOSTASIS CLIP PLACEMENT;  Surgeon: Carol Ada, MD;  [facility];  Service: Endoscopy;;  . POLYPECTOMY  01/08/2020   Procedure: POLYPECTOMY;  Surgeon: Carol Ada, MD;  [facility];  Service: Endoscopy;;  . Clide Deutscher  01/08/2020   Procedure: Clide Deutscher;  Surgeon: Carol Ada, MD;  [facility];  Service: Endoscopy;;  . SUBMUCOSAL TATTOO INJECTION  01/08/2020   Procedure: SUBMUCOSAL TATTOO INJECTION;  Surgeon: Carol Ada, MD;  [facility];  Service: Endoscopy;;     Current Outpatient Medications:  .  gabapentin (NEURONTIN) 400 MG capsule, Take 400 mg by mouth 3 (three) times daily., Disp: , Rfl:  .  ibuprofen (ADVIL) 200 MG tablet, Take 400 mg by mouth every 6 (six) hours as needed for headache or moderate pain., Disp: , Rfl:  .  lamoTRIgine (LAMICTAL) 200 MG tablet, Take 200 mg by mouth daily., Disp: , Rfl:  .  omeprazole (PRILOSEC) 20 MG capsule, Take 20 mg by mouth daily., Disp: , Rfl:  .  QUEtiapine (SEROQUEL) 400 MG tablet, Take 1 tablet (400 mg total) by mouth daily. Pt takes at bedtime with 50mg  for a total dose of 450mg  (Patient taking differently: Take 400 mg by mouth at bedtime. Pt takes at bedtime with 50mg  for a total dose of 450mg ), Disp: 30 tablet, Rfl: 0 .  QUEtiapine (SEROQUEL) 50 MG tablet, Take 1 tablet (50 mg total) by mouth at bedtime. Pt takes at bedtime with 400mg  tablet for total dose of 450mg , Disp: 30 tablet, Rfl: 0 .  sertraline  (ZOLOFT) 100 MG tablet, Take 2 tablets (200 mg total) by mouth daily., Disp: 60 tablet, Rfl: 0 Allergies  Allergen Reactions  . Penicillins Hives, Shortness Of Breath and Swelling    Boils  Did it involve swelling of the face/tongue/throat, SOB, or low BP? Yes Did it involve sudden or severe rash/hives, skin peeling, or any reaction on the inside of your mouth or nose? Yes Did you need to seek medical attention at a hospital or doctor's office? Yes When did it last happen?20 years If all above answers are "NO", may proceed with cephalosporin use.     Social History   Tobacco Use  . Smoking status: Current Every Day Smoker    Packs/day: 0.50    Types: Cigarettes  . Smokeless tobacco: Never Used  Substance Use Topics  . Alcohol use: Yes    Comment: 2x/week    Family History  Problem Relation Age of Onset  . Hypertension Mother   . Non-Hodgkin's lymphoma Mother 7  . Cirrhosis Maternal Grandfather        cirrhosis of the liver  . Lung cancer Paternal Grandfather        dx. late 85s  . Brain cancer Other        dx. >50, unsure if primary or metastasis  . Stomach cancer Other        dx. >50, unsure if primary or metastasis  . Lung cancer Other        dx. >50, unsure if primary or metastasis  . Colon cancer Maternal Great-grandmother        dx. 80s      Review of Systems  Constitutional: negative for fatigue and weight loss Respiratory: negative for cough and wheezing Cardiovascular: negative for chest pain, fatigue and palpitations Gastrointestinal: negative for abdominal pain and change in bowel habits Musculoskeletal:negative for myalgias Neurological: negative for gait problems and tremors Behavioral/Psych: negative for abusive relationship, depression Endocrine: negative for temperature intolerance    Genitourinary:negative for abnormal menstrual periods, genital lesions, hot flashes, sexual problems and vaginal discharge Integument/breast: negative for  breast lump, breast tenderness, nipple discharge and skin lesion(s)    Objective:       BP 118/78   Pulse 85   Ht 5\' 3"  (1.6 m)   Wt 231 lb (104.8 kg)   LMP 12/28/2020   BMI 40.92 kg/m  General:   alert  Skin:   no rash or abnormalities  Lungs:   clear to auscultation bilaterally  Heart:   regular rate and rhythm, S1, S2 normal, no murmur, click, rub or gallop  Breasts:   normal without  suspicious masses, skin or nipple changes or axillary nodes  Abdomen:  normal findings: no organomegaly, soft, non-tender and no hernia  Pelvis:  External genitalia: normal general appearance Urinary system: urethral meatus normal and bladder without fullness, nontender Vaginal: normal without tenderness, induration or masses Cervix: normal appearance Adnexa: normal bimanual exam Uterus: anteverted and non-tender, normal size   Lab Review Urine pregnancy test Labs reviewed yes Radiologic studies reviewed yes  50% of 20 min visit spent on counseling and coordination of care.   Assessment:     1. Encounter for gynecological examination with Papanicolaou smear of cervix Rx: - Cytology - PAP( [place])  2. Vaginal discharge Rx: - Cervicovaginal ancillary only( [place])  3. Screening for STD (sexually transmitted disease) Rx: - Hepatitis B surface antigen - Hepatitis C antibody - HIV Antibody (routine testing w rflx) - RPR  4. Class 3 severe obesity due to excess calories without serious comorbidity with body mass index (BMI) of 40.0 to 44.9 in adult [facility]) - program of caloric reduction, exercise and behavioral modification recommended   5. Encounter for counseling regarding contraception - declined contraception     Plan:    Education reviewed: calcium supplements, depression evaluation, low fat, low cholesterol diet, safe sex/STD prevention, self breast exams, smoking cessation and weight bearing exercise. Contraception: none. Follow up in: 1 year.    Orders Placed  This Encounter  Procedures  . Hepatitis B surface antigen  . Hepatitis C antibody  . HIV Antibody (routine testing w rflx)  . RPR    Shelly Bombard, MD 01/05/2021 9:18 AM

## 2021-01-06 ENCOUNTER — Other Ambulatory Visit: Payer: Self-pay | Admitting: Obstetrics

## 2021-01-06 ENCOUNTER — Other Ambulatory Visit: Payer: Medicaid Other

## 2021-01-06 DIAGNOSIS — A549 Gonococcal infection, unspecified: Secondary | ICD-10-CM

## 2021-01-06 DIAGNOSIS — A599 Trichomoniasis, unspecified: Secondary | ICD-10-CM

## 2021-01-06 DIAGNOSIS — N760 Acute vaginitis: Secondary | ICD-10-CM

## 2021-01-06 LAB — HEPATITIS B SURFACE ANTIGEN: Hepatitis B Surface Ag: NEGATIVE

## 2021-01-06 LAB — HIV ANTIBODY (ROUTINE TESTING W REFLEX): HIV Screen 4th Generation wRfx: NONREACTIVE

## 2021-01-06 LAB — RPR: RPR Ser Ql: NONREACTIVE

## 2021-01-06 LAB — CERVICOVAGINAL ANCILLARY ONLY
Bacterial Vaginitis (gardnerella): POSITIVE — AB
Candida Glabrata: NEGATIVE
Candida Vaginitis: NEGATIVE
Chlamydia: NEGATIVE
Comment: NEGATIVE
Comment: NEGATIVE
Comment: NEGATIVE
Comment: NEGATIVE
Comment: NEGATIVE
Comment: NORMAL
Neisseria Gonorrhea: POSITIVE — AB
Trichomonas: POSITIVE — AB

## 2021-01-06 LAB — HEPATITIS C ANTIBODY: Hep C Virus Ab: <0.1 s/co ratio (ref 0.0–0.9)

## 2021-01-06 MED ORDER — METRONIDAZOLE 500 MG PO TABS: 500.0000 mg | ORAL_TABLET | Freq: Two times a day (BID) | ORAL | 2 refills | Status: DC

## 2021-01-06 MED ORDER — CEFTRIAXONE SODIUM 500 MG IJ SOLR: 500.0000 mg | Freq: Once | INTRAMUSCULAR | Status: AC

## 2021-01-09 LAB — CYTOLOGY - PAP
Comment: NEGATIVE
Diagnosis: NEGATIVE
Diagnosis: REACTIVE
High risk HPV: NEGATIVE

## 2021-01-10 ENCOUNTER — Other Ambulatory Visit: Payer: Self-pay | Admitting: Obstetrics

## 2021-01-10 ENCOUNTER — Other Ambulatory Visit: Payer: Self-pay

## 2021-01-10 ENCOUNTER — Ambulatory Visit: Payer: Medicaid Other

## 2021-01-10 ENCOUNTER — Ambulatory Visit (INDEPENDENT_AMBULATORY_CARE_PROVIDER_SITE_OTHER): Payer: Medicaid Other

## 2021-01-10 DIAGNOSIS — A549 Gonococcal infection, unspecified: Secondary | ICD-10-CM | POA: Diagnosis not present

## 2021-01-10 DIAGNOSIS — B379 Candidiasis, unspecified: Secondary | ICD-10-CM

## 2021-01-10 MED ORDER — GENTAMICIN SULFATE 40 MG/ML IJ SOLN: 240.0000 mg | Freq: Once | INTRAMUSCULAR | Status: DC

## 2021-01-10 MED ORDER — AZITHROMYCIN 500 MG PO TABS: 2000.0000 mg | ORAL_TABLET | Freq: Every day | ORAL | 0 refills | Status: DC

## 2021-01-10 MED ORDER — GENTAMICIN SULFATE 40 MG/ML IJ SOLN
120.0000 mg | Freq: Once | INTRAMUSCULAR | Status: AC
  Administered 2021-01-10: 120 mg via INTRAMUSCULAR

## 2021-01-10 MED ORDER — FLUCONAZOLE 150 MG PO TABS: 150.0000 mg | ORAL_TABLET | Freq: Once | ORAL | 0 refills | Status: AC

## 2021-01-10 NOTE — Progress Notes (Signed)
Patient presents for Rocephin injection for positive gonorrhea on 01/05/21. Patient is allergic to PCN. Per Dr. Roselie Awkward to give patient 240 of gentamicin and 2 grams of azithromycin. Three 80 mg vials divided up into two 3 mg doses given. 3mg  given in RUOQ and 3mg  given in [place]. Patient monitored for 15 minutes after injection. Azithromycin sent to the pharmacy along with a diflucan rx.   Patient tolerated well.

## 2021-01-17 ENCOUNTER — Other Ambulatory Visit: Payer: Self-pay

## 2021-01-17 DIAGNOSIS — B379 Candidiasis, unspecified: Secondary | ICD-10-CM

## 2021-01-17 MED ORDER — FLUCONAZOLE 150 MG PO TABS: 150.0000 mg | ORAL_TABLET | Freq: Once | ORAL | 0 refills | Status: AC

## 2021-01-17 NOTE — Progress Notes (Signed)
Pt requesting Rx for yeast due to taking antibiotics  ok to send per protocol Pt made aware.

## 2021-01-18 ENCOUNTER — Other Ambulatory Visit: Payer: Self-pay

## 2021-01-18 DIAGNOSIS — N760 Acute vaginitis: Secondary | ICD-10-CM

## 2021-01-18 DIAGNOSIS — B9689 Other specified bacterial agents as the cause of diseases classified elsewhere: Secondary | ICD-10-CM

## 2021-01-18 DIAGNOSIS — A599 Trichomoniasis, unspecified: Secondary | ICD-10-CM

## 2021-01-18 MED ORDER — METRONIDAZOLE 500 MG PO TABS: 500.0000 mg | ORAL_TABLET | Freq: Two times a day (BID) | ORAL | 2 refills | Status: DC

## 2021-02-20 ENCOUNTER — Encounter: Payer: Self-pay | Admitting: Genetic Counselor

## 2021-07-28 ENCOUNTER — Ambulatory Visit: Payer: Medicaid Other | Admitting: Obstetrics

## 2021-08-16 ENCOUNTER — Ambulatory Visit: Payer: Medicaid Other | Admitting: Obstetrics

## 2021-08-30 ENCOUNTER — Telehealth: Payer: Self-pay

## 2021-08-30 NOTE — Telephone Encounter (Signed)
LM for Amy Yoder stating that she was to call the office in late Nov.early December of 2021  to schedule a follow up appointment in February of 2022 with Dr. Berline Lopes. She can call the office tomorrow morning after 0800 to schedule a follow up appointment with Dr. Berline Lopes.  Appointments are not set up through e-mail.

## 2021-09-06 NOTE — Telephone Encounter (Signed)
Called and left the patient a message to call the office back to schedule an appt

## 2021-10-06 IMAGING — DX DG CHEST 1V PORT
1 series · 1 of 1 positions shown · non-contrast
Comparison: 01/03/2018

CLINICAL DATA: Headache, fever, body aches

EXAM:
PORTABLE CHEST 1 VIEW

[chest ap]
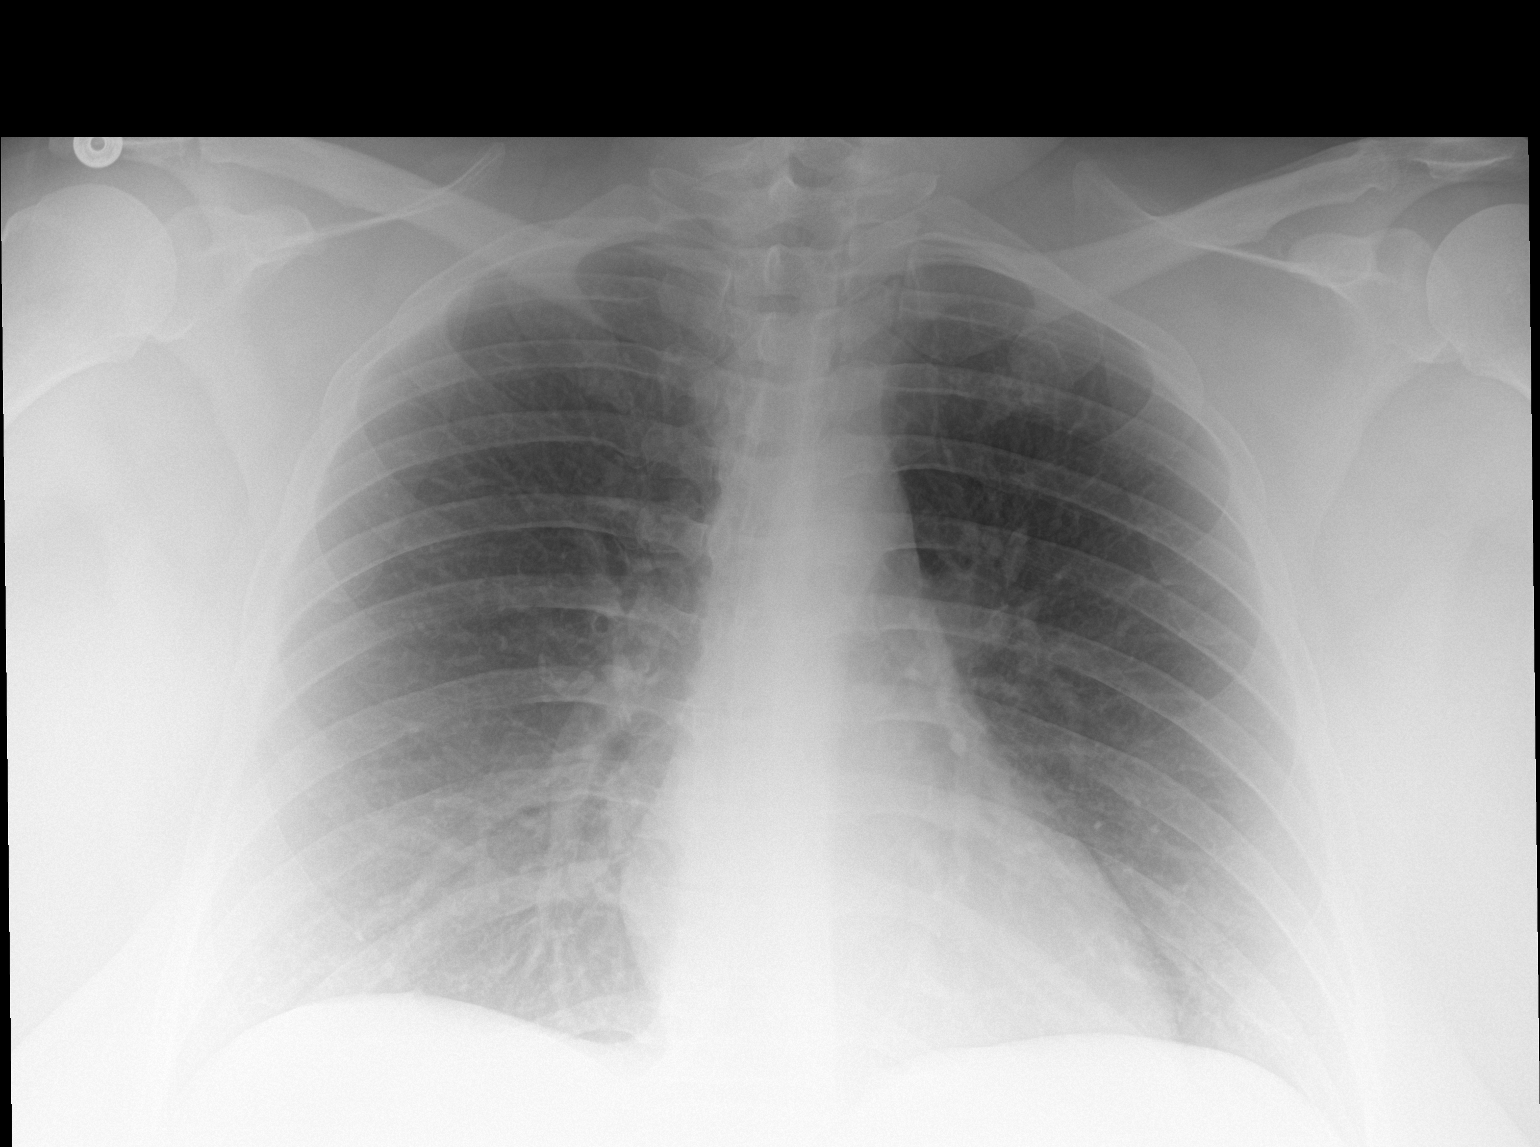

[1 of 1 positions shown; findings below may reference images not displayed]

FINDINGS: The heart size and mediastinal contours are within normal limits.
Both lungs are clear. The visualized skeletal structures are
unremarkable.
IMPRESSION: No active disease.

## 2021-10-27 ENCOUNTER — Ambulatory Visit: Payer: Medicaid Other

## 2021-12-01 ENCOUNTER — Other Ambulatory Visit: Payer: Self-pay

## 2021-12-01 ENCOUNTER — Encounter (HOSPITAL_BASED_OUTPATIENT_CLINIC_OR_DEPARTMENT_OTHER): Payer: Self-pay

## 2021-12-01 ENCOUNTER — Emergency Department (HOSPITAL_BASED_OUTPATIENT_CLINIC_OR_DEPARTMENT_OTHER)
Admission: EM | Admit: 2021-12-01 | Discharge: 2021-12-01 | Disposition: A | Discharge status: Home / Self Care | Payer: Medicaid Other | Attending: Emergency Medicine | Admitting: Emergency Medicine

## 2021-12-01 DIAGNOSIS — Z20822 Contact with and (suspected) exposure to covid-19: Secondary | ICD-10-CM | POA: Insufficient documentation

## 2021-12-01 DIAGNOSIS — R509 Fever, unspecified: Secondary | ICD-10-CM | POA: Diagnosis present

## 2021-12-01 DIAGNOSIS — J111 Influenza due to unidentified influenza virus with other respiratory manifestations: Secondary | ICD-10-CM | POA: Insufficient documentation

## 2021-12-01 DIAGNOSIS — Z85038 Personal history of other malignant neoplasm of large intestine: Secondary | ICD-10-CM | POA: Diagnosis not present

## 2021-12-01 DIAGNOSIS — I10 Essential (primary) hypertension: Secondary | ICD-10-CM | POA: Diagnosis not present

## 2021-12-01 DIAGNOSIS — F17210 Nicotine dependence, cigarettes, uncomplicated: Secondary | ICD-10-CM | POA: Diagnosis not present

## 2021-12-01 LAB — RESP PANEL BY RT-PCR (FLU A&B, COVID) ARPGX2
Influenza A by PCR: NEGATIVE
Influenza B by PCR: NEGATIVE
SARS Coronavirus 2 by RT PCR: NEGATIVE

## 2021-12-01 MED ORDER — BENZONATATE 100 MG PO CAPS
100.0000 mg | ORAL_CAPSULE | Freq: Three times a day (TID) | ORAL | 0 refills | Status: DC
Start: 2021-12-01 — End: 2022-08-01

## 2021-12-01 MED ORDER — ONDANSETRON HCL 4 MG PO TABS
4.0000 mg | ORAL_TABLET | Freq: Four times a day (QID) | ORAL | 0 refills | Status: AC
Start: 2021-12-01 — End: 2021-12-06

## 2021-12-01 NOTE — ED Provider Notes (Signed)
[facility] EMERGENCY DEPT Provider Note   CSN: [id number] Arrival date & time: 12/01/21  1336     History Chief Complaint  Patient presents with   Fever    Amy Yoder is a 35 y.o. female presenting today with a complaint of body aches, fever, cough and congestion that began last night.  Multiple people at work have [place].  Denies chest pain or shortness of breath.  She went to go donate blood this morning and they told her that her fever was 103 so she was unable to donate and decided to come to the emergency department.  She took DayQuil last night however no medications today.   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   Colon cancer ([place]) 02/02/2020   Family history of colon cancer    Family history of non-Hodgkin's lymphoma    Family history of lung cancer    Family history of brain cancer    Family history of stomach cancer    Bipolar II disorder ([place])    MDD (major depressive disorder), recurrent episode, severe ([place]) 12/27/2019   Pelvic abscess in female 02/16/2019   TOA (tubo-ovarian abscess) 02/15/2019   Hypertension 02/15/2019   BMI 39.0-39.9,adult 02/15/2019   Chlamydia 02/14/2019    Past Surgical History:  Procedure Laterality Date   CHOLECYSTECTOMY N/A 03/12/2014   Procedure: LAPAROSCOPIC CHOLECYSTECTOMY WITH INTRAOPERATIVE CHOLANGIOGRAM;  Surgeon: Merrie Roof,  MD;  [facility];  Service: General;  Laterality: N/A;   [place]     x2 for EAB   FLEXIBLE SIGMOIDOSCOPY N/A 01/08/2020   Procedure: FLEXIBLE SIGMOIDOSCOPY;  Surgeon: Carol Ada, MD;  [facility];  Service: Endoscopy;  Laterality: N/A;   HEMOSTASIS CLIP PLACEMENT  01/08/2020   Procedure: HEMOSTASIS CLIP PLACEMENT;  Surgeon: Carol Ada, MD;  [facility];  Service: Endoscopy;;   POLYPECTOMY  01/08/2020   Procedure: POLYPECTOMY;  Surgeon: Carol Ada, MD;  [facility];  Service: Endoscopy;;   SCLEROTHERAPY  01/08/2020   Procedure: Clide Deutscher;  Surgeon: Carol Ada, MD;  [facility];  Service: Endoscopy;;   SUBMUCOSAL TATTOO INJECTION  01/08/2020   Procedure: SUBMUCOSAL TATTOO INJECTION;  Surgeon: Carol Ada, MD;  [facility];  Service: Endoscopy;;     OB History     Gravida  5   Para  1   Term  1   Preterm      AB  4   Living  1      SAB  2   IAB  2   Ectopic      Multiple      Live Births  1           Family History  Problem Relation Age of Onset  Hypertension Mother    Non-Hodgkin's lymphoma Mother 25   Cirrhosis Maternal Grandfather        cirrhosis of the liver   Lung cancer Paternal Grandfather        dx. late 14s   Brain cancer Other        dx. >50, unsure if primary or metastasis   Stomach cancer Other        dx. >50, unsure if primary or metastasis   Lung cancer Other        dx. >50, unsure if primary or metastasis   Colon cancer Maternal Great-grandmother        dx. 53s    Social History   Tobacco Use   Smoking status: Every Day    Packs/day: 0.50    Types: Cigarettes   Smokeless tobacco: Never  Vaping Use   Vaping Use: Never used  Substance Use Topics   Alcohol use: Yes    Comment: 2x/week   Drug use: Yes    Comment: occasional Marijuana use    Home Medications Prior to Admission medications   Medication Sig Start  Date End Date Taking? Authorizing Provider  benzonatate (TESSALON) 100 MG capsule Take 1 capsule (100 mg total) by mouth every 8 (eight) hours. 12/01/21  Yes Shekina Cordell A, PA-C  ondansetron (ZOFRAN) 4 MG tablet Take 1 tablet (4 mg total) by mouth every 6 (six) hours for 5 days. 12/01/21 12/06/21 Yes Mills Mitton A, PA-C  azithromycin (ZITHROMAX) 500 MG tablet Take 4 tablets (2,000 mg total) by mouth daily. 01/10/21   Woodroe Mode, MD  gabapentin (NEURONTIN) 400 MG capsule Take 400 mg by mouth 3 (three) times daily. 06/16/20   [provider]  ibuprofen (ADVIL) 200 MG tablet Take 400 mg by mouth every 6 (six) hours as needed for headache or moderate pain.    [provider]  lamoTRIgine (LAMICTAL) 200 MG tablet Take 200 mg by mouth daily. 06/16/20   [provider]  metroNIDAZOLE (FLAGYL) 500 MG tablet Take 1 tablet (500 mg total) by mouth 2 (two) times daily. 01/18/21   Shelly Bombard, MD  omeprazole (PRILOSEC) 20 MG capsule Take 20 mg by mouth daily. 06/08/20   [provider]  QUEtiapine (SEROQUEL) 400 MG tablet Take 1 tablet (400 mg total) by mouth daily. Pt takes at bedtime with $RemoveBefo'50mg'uYeunuZHhrc$  for a total dose of $Remov'450mg'GIYtvw$  Patient taking differently: Take 400 mg by mouth at bedtime. Pt takes at bedtime with $RemoveBefo'50mg'UNRPAvQqFfI$  for a total dose of $Remov'450mg'xNNkNa$  12/28/19   Connye Burkitt, NP  QUEtiapine (SEROQUEL) 50 MG tablet Take 1 tablet (50 mg total) by mouth at bedtime. Pt takes at bedtime with $RemoveBefo'400mg'aquMEyDKmts$  tablet for total dose of $Remov'450mg'EVqwIs$  12/28/19   Connye Burkitt, NP  sertraline (ZOLOFT) 100 MG tablet Take 2 tablets (200 mg total) by mouth daily. 12/29/19   Connye Burkitt, NP    Allergies    Penicillins  Review of Systems   Review of Systems  Constitutional:  Positive for fever.  HENT:  Positive for congestion.   Respiratory:  Positive for cough.   Gastrointestinal:  Positive for nausea.  Musculoskeletal:  Positive for myalgias.   Physical Exam Updated Vital Signs BP (!) 156/89 (BP  Location: Right Arm)    Pulse 98    Temp 98.9 F (37.2 C) (Oral)    Resp 16    Ht $R'5\' 3"'Sk$  (1.6 m)    Wt 104.8 kg    SpO2 100%  BMI 40.93 kg/m   Physical Exam Vitals and nursing note reviewed.  Constitutional:      General: She is not in acute distress.    Appearance: Normal appearance. She is not ill-appearing.  HENT:     Head: Normocephalic and atraumatic.     Right Ear: Tympanic membrane normal.     Left Ear: Tympanic membrane normal.     Nose: No congestion.     Mouth/Throat:     Mouth: Mucous membranes are moist.     Pharynx: Oropharynx is clear. Posterior oropharyngeal erythema present.  Eyes:     General: No scleral icterus.    Conjunctiva/sclera: Conjunctivae normal.  Cardiovascular:     Rate and Rhythm: Normal rate and regular rhythm.     Heart sounds: Murmur (Systolic) heard.  Pulmonary:     Effort: Pulmonary effort is normal. No respiratory distress.     Breath sounds: No wheezing or rales.  Skin:    General: Skin is warm and dry.     Findings: No rash.  Neurological:     Mental Status: She is alert.  Psychiatric:        Mood and Affect: Mood normal.    ED Results / Procedures / Treatments   Labs (all labs ordered are listed, but only abnormal results are displayed) Labs Reviewed  RESP PANEL BY RT-PCR (FLU A&B, COVID) ARPGX2    EKG None  Radiology No results found.  Procedures Procedures   Medications Ordered in ED Medications - No data to display  ED Course  I have reviewed the triage vital signs and the nursing notes.  Pertinent labs & imaging results that were available during my care of the patient were reviewed by me and considered in my medical decision making (see chart for details).    MDM Rules/Calculators/A&P 35 year old female with no past medical history presenting today with a complaint of flulike symptoms.  Stable on exam.  Lung sounds clear however she does have a productive cough.  COVID and flu testing negative today.  She will  be prescribed Tessalon Perles as well as Zofran for nausea and treat her symptoms with over-the-counter medications.  Final Clinical Impression(s) / ED Diagnoses Final diagnoses:  Influenza-like illness    Rx / DC Orders ED Discharge Orders          Ordered    benzonatate (TESSALON) 100 MG capsule  Every 8 hours        12/01/21 1424    ondansetron (ZOFRAN) 4 MG tablet  Every 6 hours        12/01/21 1424           Results and diagnoses were explained to the patient. Return precautions discussed in full. Patient had no additional questions and expressed complete understanding.    Rhae Hammock, PA-C 12/01/21 1453    Isla Pence, MD 12/01/21 1505

## 2021-12-01 NOTE — ED Triage Notes (Signed)
Patient here POV from Home with Flu-Like Symptoms.  Patient has been experiencing Fatigue, Cough, Congestion, Nausea, Body Aches and Fever since last PM.  NAD Noted during Triage. A&Ox4. GCS 15. Ambulatory.

## 2021-12-01 NOTE — Discharge Instructions (Signed)
You do not have flu or COVID.  You may treat your symptoms with over-the-counter medications as we discussed.  DayQuil and NyQuil are good choices.  Mucinex must be taken with a lot of water.  I have sent nausea medication as well as benzonatate pills to your pharmacy.  I hope that you feel better.

## 2021-12-07 ENCOUNTER — Ambulatory Visit: Payer: Medicaid Other | Admitting: Obstetrics & Gynecology

## 2022-03-08 ENCOUNTER — Ambulatory Visit: Payer: Medicaid Other

## 2022-04-25 ENCOUNTER — Ambulatory Visit: Payer: Medicaid Other

## 2022-05-02 ENCOUNTER — Ambulatory Visit (INDEPENDENT_AMBULATORY_CARE_PROVIDER_SITE_OTHER): Payer: Medicaid Other

## 2022-05-02 DIAGNOSIS — R350 Frequency of micturition: Secondary | ICD-10-CM | POA: Diagnosis not present

## 2022-05-02 LAB — POCT URINALYSIS DIPSTICK
Bilirubin, UA: NEGATIVE
Blood, UA: NEGATIVE
Glucose, UA: NEGATIVE
Ketones, UA: NEGATIVE
Nitrite, UA: NEGATIVE
Protein, UA: NEGATIVE
Spec Grav, UA: 1.025 (ref 1.010–1.025)
Urobilinogen, UA: 0.2 E.U./dL
pH, UA: 5.5 (ref 5.0–8.0)

## 2022-05-02 MED ORDER — NITROFURANTOIN MONOHYD MACRO 100 MG PO CAPS: 100.0000 mg | ORAL_CAPSULE | Freq: Two times a day (BID) | ORAL | 0 refills | Status: DC

## 2022-05-02 MED ORDER — FLUCONAZOLE 150 MG PO TABS: 150.0000 mg | ORAL_TABLET | Freq: Once | ORAL | 0 refills | Status: AC

## 2022-05-02 NOTE — Progress Notes (Signed)
..  SUBJECTIVE: Amy Yoder is a 36 y.o. female who complains of urinary frequency, urgency and dysuria x 30 days, DENIES flank pain, fever, chills, or abnormal vaginal discharge or bleeding.   OBJECTIVE: Appears well, in no apparent distress.  Vital signs are normal. Urine dipstick shows positive for leukocytes.    ASSESSMENT: Dysuria  PLAN: Treatment per orders.  Call or return to clinic prn if these symptoms worsen or fail to improve as anticipated. Macrobid sent per protocol Pt requesting yeast rx after antibiotics, sent w/ approval.

## 2022-05-04 LAB — URINE CULTURE

## 2022-05-29 ENCOUNTER — Telehealth: Payer: Self-pay

## 2022-06-06 ENCOUNTER — Ambulatory Visit: Payer: Medicaid Other | Admitting: Obstetrics

## 2022-07-18 ENCOUNTER — Ambulatory Visit: Payer: Medicaid Other | Admitting: Obstetrics and Gynecology

## 2022-08-01 ENCOUNTER — Encounter: Payer: Self-pay | Admitting: Gynecologic Oncology

## 2022-08-03 ENCOUNTER — Inpatient Hospital Stay: Payer: Medicaid Other | Admitting: Gynecologic Oncology

## 2022-08-03 DIAGNOSIS — Z1509 Genetic susceptibility to other malignant neoplasm: Secondary | ICD-10-CM

## 2022-08-03 NOTE — Progress Notes (Unsigned)
Gynecologic Oncology Return Clinic Visit  08/03/2022  Reason for Visit: screening in the setting of Lynch syndrome  Treatment History: Oncology History  Colon cancer ([place])  02/02/2020 Initial Diagnosis   Colon cancer ([place])   03/10/2020 Genetic Testing   Positive genetic testing:  A single pathogenic variant was detected in the MLH1 gene, called c.1381A>T (p.Lys461*), through the [facility] Common Hereditary Cancers panel. A variant of uncertain significance was also detected in the MEN1 gene, called c.511C>T (p.Arg171Trp). The report date is 03/10/2020.  The Common Hereditary Cancers Panel offered by [facility] includes sequencing and/or deletion duplication testing of the following 48 genes: APC, ATM, AXIN2, BARD1, BMPR1A, BRCA1, BRCA2, BRIP1, CDH1, CDK4, CDKN2A (p14ARF), CDKN2A (p16INK4a), CHEK2, CTNNA1, DICER1, EPCAM (Deletion/duplication testing only), GREM1 (promoter region deletion/duplication testing only), KIT, MEN1, MLH1, MSH2, MSH3, MSH6, MUTYH, NBN, NF1, NHTL1, PALB2, PDGFRA, PMS2, POLD1, POLE, PTEN, RAD50, RAD51C, RAD51D, RNF43, SDHB, SDHC, SDHD, SMAD4, SMARCA4. STK11, TP53, TSC1, TSC2, and VHL.  The following genes were evaluated for sequence changes only: SDHA and HOXB13 c.251G>A variant only.     Interval History: ***  Patient presents with her aunt, Amy Yoder, today. She is overall doing well. She denies any abnormal bleeding since I last saw her, just menses shorter last month (2 days) than normal.   She underwent repeat colonoscopy recently with normal findings per her report. Bowels have been soft, hard, loose; stool is frequently different colors.   Amy Yoder is frustrated that she has not been able to lose weight. She continues to be very active because of her job. Reports "eating well". Concerned that Seroquel (which she has been on for 17 years) is causing her inability to lose weight).  Past Medical/Surgical History: Past Medical History:  Diagnosis Date   Bipolar I disorder  ([place])    Bronchitis    Colon cancer ([place])    Depression    Family history of brain cancer    Family history of colon cancer    Family history of lung cancer    Family history of non-Hodgkin's lymphoma    Family history of stomach cancer    Hypertension 02/15/2019   Lynch syndrome    Marijuana use    Symptomatic cholelithiasis s/p lap chole 03/12/2014 03/11/2014   Tobacco use     Past Surgical History:  Procedure Laterality Date   CHOLECYSTECTOMY N/A 03/12/2014   Procedure: LAPAROSCOPIC CHOLECYSTECTOMY WITH INTRAOPERATIVE CHOLANGIOGRAM;  Surgeon: Merrie Roof, MD;  [facility];  Service: General;  Laterality: N/A;   [place]     x2 for EAB   FLEXIBLE SIGMOIDOSCOPY N/A 01/08/2020   Procedure: FLEXIBLE SIGMOIDOSCOPY;  Surgeon: Carol Ada, MD;  [facility];  Service: Endoscopy;  Laterality: N/A;   HEMOSTASIS CLIP PLACEMENT  01/08/2020   Procedure: HEMOSTASIS CLIP PLACEMENT;  Surgeon: Carol Ada, MD;  [facility];  Service: Endoscopy;;   POLYPECTOMY  01/08/2020   Procedure: POLYPECTOMY;  Surgeon: Carol Ada, MD;  [facility];  Service: Endoscopy;;   SCLEROTHERAPY  01/08/2020   Procedure: Clide Deutscher;  Surgeon: Carol Ada, MD;  [facility];  Service: Endoscopy;;   SUBMUCOSAL TATTOO INJECTION  01/08/2020   Procedure: SUBMUCOSAL TATTOO INJECTION;  Surgeon: Carol Ada, MD;  [facility];  Service: Endoscopy;;    Family History  Problem Relation Age of Onset   Hypertension Mother    Non-Hodgkin's lymphoma Mother 107   Cirrhosis Maternal Grandfather  cirrhosis of the liver   Lung cancer Paternal Grandfather        dx. late 90s   Colon cancer Maternal Great-grandmother        dx. 62s   Brain cancer Other        dx. >50, unsure if primary or metastasis   Stomach cancer Other        dx. >50, unsure if primary or metastasis   Lung cancer Other        dx. >50, unsure  if primary or metastasis   Breast cancer Neg Hx    Ovarian cancer Neg Hx    Endometrial cancer Neg Hx    Pancreatic cancer Neg Hx    Prostate cancer Neg Hx     Social History   Socioeconomic History   Marital status: Single    Spouse name: Not on file   Number of children: Not on file   Years of education: Not on file   Highest education level: Not on file  Occupational History   Not on file  Tobacco Use   Smoking status: Every Day    Packs/day: 0.50    Types: Cigarettes   Smokeless tobacco: Never  Vaping Use   Vaping Use: Never used  Substance and Sexual Activity   Alcohol use: Yes    Comment: 2x/week   Drug use: Yes    Types: Marijuana    Comment: occasional Marijuana use   Sexual activity: Yes    Partners: Male    Birth control/protection: Condom  Other Topics Concern   Not on file  Social History Narrative   Not on file   Social Determinants of Health   Financial Resource Strain: Not on file  Food Insecurity: Not on file  Transportation Needs: Not on file  Physical Activity: Not on file  Stress: Not on file  Social Connections: Not on file    Current Medications:  Current Outpatient Medications:    atomoxetine (STRATTERA) 25 MG capsule, Take 25 mg by mouth every morning., Disp: , Rfl:    clonazePAM (KLONOPIN) 0.5 MG tablet, Take 0.5 mg by mouth daily., Disp: , Rfl:    gabapentin (NEURONTIN) 400 MG capsule, Take 400 mg by mouth 3 (three) times daily., Disp: , Rfl:    ibuprofen (ADVIL) 800 MG tablet, Take 800 mg by mouth 3 (three) times daily as needed., Disp: , Rfl:    metroNIDAZOLE (METROGEL) 0.75 % vaginal gel, Place vaginally at bedtime., Disp: , Rfl:    QUEtiapine (SEROQUEL) 400 MG tablet, Take 1 tablet (400 mg total) by mouth daily. Pt takes at bedtime with $RemoveBefo'50mg'CQxioYvVNph$  for a total dose of $Remov'450mg'BtjSRH$  (Patient taking differently: Take 400 mg by mouth at bedtime. Pt takes at bedtime with $RemoveBefo'50mg'flDSKqyEyIW$  for a total dose of $Remov'450mg'lvItnq$ ), Disp: 30 tablet, Rfl: 0   QUEtiapine  (SEROQUEL) 50 MG tablet, Take 1 tablet (50 mg total) by mouth at bedtime. Pt takes at bedtime with $RemoveBefo'400mg'hLSnJazsMoH$  tablet for total dose of $Remov'450mg'nbXMAV$ , Disp: 30 tablet, Rfl: 0   sertraline (ZOLOFT) 100 MG tablet, Take 2 tablets (200 mg total) by mouth daily., Disp: 60 tablet, Rfl: 0   VENTOLIN HFA 108 (90 Base) MCG/ACT inhaler, SMARTSIG:2 Puff(s) By Mouth Every 6 Hours, Disp: , Rfl:    Vitamin D, Ergocalciferol, (DRISDOL) 1.25 MG (50000 UNIT) CAPS capsule, Take 50,000 Units by mouth once a week., Disp: , Rfl:   Current Facility-Administered Medications:    cefTRIAXone (ROCEPHIN) injection 500 mg, 500 mg, Intramuscular, Once, Baltazar Najjar  A, MD  Review of Systems: Denies appetite changes, fevers, chills, fatigue, unexplained weight changes. Denies hearing loss, neck lumps or masses, mouth sores, ringing in ears or voice changes. Denies cough or wheezing.  Denies shortness of breath. Denies chest pain or palpitations. Denies leg swelling. Denies abdominal distention, pain, blood in stools, constipation, diarrhea, nausea, vomiting, or early satiety. Denies pain with intercourse, dysuria, frequency, hematuria or incontinence. Denies hot flashes, pelvic pain, vaginal bleeding or vaginal discharge.   Denies joint pain, back pain or muscle pain/cramps. Denies itching, rash, or wounds. Denies dizziness, headaches, numbness or seizures. Denies swollen lymph nodes or glands, denies easy bruising or bleeding. Denies anxiety, depression, confusion, or decreased concentration.  Physical Exam: There were no vitals taken for this visit. General: ***Alert, oriented, no acute distress. HEENT: ***Posterior oropharynx clear, sclera anicteric. Chest: ***Clear to auscultation bilaterally.  ***Port site clean. Cardiovascular: ***Regular rate and rhythm, no murmurs. Abdomen: ***Obese, soft, nontender.  Normoactive bowel sounds.  No masses or hepatosplenomegaly appreciated.  ***Well-healed scar. Extremities: ***Grossly  normal range of motion.  Warm, well perfused.  No edema bilaterally. Skin: ***No rashes or lesions noted. Lymphatics: ***No cervical, supraclavicular, or inguinal adenopathy. GU: Normal appearing external genitalia without erythema, excoriation, or lesions.  Speculum exam reveals ***.  Bimanual exam reveals ***.  ***Rectovaginal exam  confirms ___.  Laboratory & Radiologic Studies: 04/18/20: EMB proliferative endometrium, no hyperplasia or malignancy. 12/2020: pap NIML, HR HPV negative  Assessment & Plan: Amy Yoder is a 36 y.o. 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    Isaiah brought her aunt to today's appointment for a discussion about Lynch syndrome. I spent a good deal of time reviewing again her early-stage colon cancer diagnosis, the MLH1 gene mutation that she harbors, the other cancers that she is at risk for developing, and the screening that we will perform from a gyn cancer standpoint (uterine) until ready to move forward with risk-reducing surgery. We reviewed her EMB from May that was without hyperplasia or malignancy. We discussed symptoms (notably menstrual changes or intermenstrual bleeding) that should prompt a call to me sooner than her next scheduled visit.    In terms of surgery, we had previously discussed the NCCN recommendations for hysterectomy. Depending on when we move forward with hysterectomy, we could proceed with BS or BSO. If surgery occurs sooner, I would recommend a staged procedure and leaving her ovaries in situ with the plan to remove closer to age 28.    I spent about 15 minutes solely discussing weight loss and nutrition. While Amy Yoder continues to endorse making good food choices, she has yet to keep a food diary that we can review. She also continues to drink large quantities of Kool Aid and sweet tea. I suspect that she is consuming >1000 calories just with what she is drinking. I asked her to work on a  food diary for the net 2 weeks and she will then review this with her aunt to identify one of two things that may be contributing to excess calories. I suspect that she would be able to start losing weight easily if she cut down or cut out her calorie-rich beverages. We discussed her calorie goal based on her current weight and I showed her several online tools and applications (Lose It! App which she can use for food diary entry) to help her stay on track with her goals.   *** minutes of total time was spent for  this patient encounter, including preparation, face-to-face counseling with the patient and coordination of care, and documentation of the encounter.  Jeral Pinch, MD  Division of Gynecologic Oncology  Department of Obstetrics and Gynecology  [facility]

## 2022-09-18 ENCOUNTER — Inpatient Hospital Stay: Payer: Medicaid Other | Admitting: Gynecologic Oncology

## 2022-09-18 ENCOUNTER — Telehealth: Payer: Self-pay | Admitting: *Deleted

## 2022-09-18 NOTE — Progress Notes (Deleted)
Gynecologic Oncology Return Clinic Visit  09/18/22  Reason for Visit: screening in the setting of Lynch syndrome  Treatment History: Oncology History  Colon cancer ([place])  02/02/2020 Initial Diagnosis   Colon cancer ([place])   03/10/2020 Genetic Testing   Positive genetic testing:  A single pathogenic variant was detected in the MLH1 gene, called c.1381A>T (p.Lys461*), through the [facility] Common Hereditary Cancers panel. A variant of uncertain significance was also detected in the MEN1 gene, called c.511C>T (p.Arg171Trp). The report date is 03/10/2020.  The Common Hereditary Cancers Panel offered by [facility] includes sequencing and/or deletion duplication testing of the following 48 genes: APC, ATM, AXIN2, BARD1, BMPR1A, BRCA1, BRCA2, BRIP1, CDH1, CDK4, CDKN2A (p14ARF), CDKN2A (p16INK4a), CHEK2, CTNNA1, DICER1, EPCAM (Deletion/duplication testing only), GREM1 (promoter region deletion/duplication testing only), KIT, MEN1, MLH1, MSH2, MSH3, MSH6, MUTYH, NBN, NF1, NHTL1, PALB2, PDGFRA, PMS2, POLD1, POLE, PTEN, RAD50, RAD51C, RAD51D, RNF43, SDHB, SDHC, SDHD, SMAD4, SMARCA4. STK11, TP53, TSC1, TSC2, and VHL.  The following genes were evaluated for sequence changes only: SDHA and HOXB13 c.251G>A variant only.    04/2020: EMB - proliferative endometrium, no hyperplasia or malignancy  12/2020: NIML pap, HR HPV negative  Interval History: ***  Past Medical/Surgical History: Past Medical History:  Diagnosis Date   Bipolar I disorder ([place])    Bronchitis    Colon cancer ([place])    Depression    Family history of brain cancer    Family history of colon cancer    Family history of lung cancer    Family history of non-Hodgkin's lymphoma    Family history of stomach cancer    Hypertension 02/15/2019   Lynch syndrome    Marijuana use    Symptomatic cholelithiasis s/p lap chole 03/12/2014 03/11/2014   Tobacco use     Past Surgical History:  Procedure Laterality Date   CHOLECYSTECTOMY N/A 03/12/2014    Procedure: LAPAROSCOPIC CHOLECYSTECTOMY WITH INTRAOPERATIVE CHOLANGIOGRAM;  Surgeon: Merrie Roof, MD;  [facility];  Service: General;  Laterality: N/A;   [place]     x2 for EAB   FLEXIBLE SIGMOIDOSCOPY N/A 01/08/2020   Procedure: FLEXIBLE SIGMOIDOSCOPY;  Surgeon: Carol Ada, MD;  [facility];  Service: Endoscopy;  Laterality: N/A;   HEMOSTASIS CLIP PLACEMENT  01/08/2020   Procedure: HEMOSTASIS CLIP PLACEMENT;  Surgeon: Carol Ada, MD;  [facility];  Service: Endoscopy;;   POLYPECTOMY  01/08/2020   Procedure: POLYPECTOMY;  Surgeon: Carol Ada, MD;  [facility];  Service: Endoscopy;;   SCLEROTHERAPY  01/08/2020   Procedure: Clide Deutscher;  Surgeon: Carol Ada, MD;  [facility];  Service: Endoscopy;;   SUBMUCOSAL TATTOO INJECTION  01/08/2020   Procedure: SUBMUCOSAL TATTOO INJECTION;  Surgeon: Carol Ada, MD;  [facility];  Service: Endoscopy;;    Family History  Problem Relation Age of Onset   Hypertension Mother    Non-Hodgkin's lymphoma Mother 28   Cirrhosis Maternal Grandfather        cirrhosis of the liver   Lung cancer Paternal Grandfather        dx. late 83s   Colon cancer Maternal Great-grandmother        dx. 51s   Brain cancer Other        dx. >50, unsure if primary or metastasis   Stomach cancer Other        dx. >50, unsure if primary or metastasis   Lung cancer Other  dx. >50, unsure if primary or metastasis   Breast cancer Neg Hx    Ovarian cancer Neg Hx    Endometrial cancer Neg Hx    Pancreatic cancer Neg Hx    Prostate cancer Neg Hx     Social History   Socioeconomic History   Marital status: Single    Spouse name: Not on file   Number of children: Not on file   Years of education: Not on file   Highest education level: Not on file  Occupational History   Not on file  Tobacco Use   Smoking status: Every Day    Packs/day: 0.50     Types: Cigarettes   Smokeless tobacco: Never  Vaping Use   Vaping Use: Never used  Substance and Sexual Activity   Alcohol use: Yes    Comment: 2x/week   Drug use: Yes    Types: Marijuana    Comment: occasional Marijuana use   Sexual activity: Yes    Partners: Male    Birth control/protection: Condom  Other Topics Concern   Not on file  Social History Narrative   Not on file   Social Determinants of Health   Financial Resource Strain: Not on file  Food Insecurity: Not on file  Transportation Needs: Not on file  Physical Activity: Not on file  Stress: Not on file  Social Connections: Not on file    Current Medications:  Current Outpatient Medications:    atomoxetine (STRATTERA) 25 MG capsule, Take 25 mg by mouth every morning., Disp: , Rfl:    clonazePAM (KLONOPIN) 0.5 MG tablet, Take 0.5 mg by mouth daily., Disp: , Rfl:    gabapentin (NEURONTIN) 400 MG capsule, Take 400 mg by mouth 3 (three) times daily., Disp: , Rfl:    ibuprofen (ADVIL) 800 MG tablet, Take 800 mg by mouth 3 (three) times daily as needed., Disp: , Rfl:    metroNIDAZOLE (METROGEL) 0.75 % vaginal gel, Place vaginally at bedtime., Disp: , Rfl:    QUEtiapine (SEROQUEL) 400 MG tablet, Take 1 tablet (400 mg total) by mouth daily. Pt takes at bedtime with 38m for a total dose of 4535m(Patient taking differently: Take 400 mg by mouth at bedtime. Pt takes at bedtime with 5031mor a total dose of 450m61mDisp: 30 tablet, Rfl: 0   QUEtiapine (SEROQUEL) 50 MG tablet, Take 1 tablet (50 mg total) by mouth at bedtime. Pt takes at bedtime with 400mg63mlet for total dose of 450mg,74mp: 30 tablet, Rfl: 0   sertraline (ZOLOFT) 100 MG tablet, Take 2 tablets (200 mg total) by mouth daily., Disp: 60 tablet, Rfl: 0   VENTOLIN HFA 108 (90 Base) MCG/ACT inhaler, SMARTSIG:2 Puff(s) By Mouth Every 6 Hours, Disp: , Rfl:    Vitamin D, Ergocalciferol, (DRISDOL) 1.25 MG (50000 UNIT) CAPS capsule, Take 50,000 Units by mouth once a  week., Disp: , Rfl:   Current Facility-Administered Medications:    cefTRIAXone (ROCEPHIN) injection 500 mg, 500 mg, Intramuscular, Once, Amy BombardReview of Systems: Denies appetite changes, fevers, chills, fatigue, unexplained weight changes. Denies hearing loss, neck lumps or masses, mouth sores, ringing in ears or voice changes. Denies cough or wheezing.  Denies shortness of breath. Denies chest pain or palpitations. Denies leg swelling. Denies abdominal distention, pain, blood in stools, constipation, diarrhea, nausea, vomiting, or early satiety. Denies pain with intercourse, dysuria, frequency, hematuria or incontinence. Denies hot flashes, pelvic pain, vaginal bleeding or vaginal discharge.   Denies joint pain,  back pain or muscle pain/cramps. Denies itching, rash, or wounds. Denies dizziness, headaches, numbness or seizures. Denies swollen lymph nodes or glands, denies easy bruising or bleeding. Denies anxiety, depression, confusion, or decreased concentration.  Physical Exam: There were no vitals taken for this visit. General: ***Alert, oriented, no acute distress. HEENT: ***Posterior oropharynx clear, sclera anicteric. Chest: ***Clear to auscultation bilaterally.  ***Port site clean. Cardiovascular: ***Regular rate and rhythm, no murmurs. Abdomen: ***Obese, soft, nontender.  Normoactive bowel sounds.  No masses or hepatosplenomegaly appreciated.  ***Well-healed scar. Extremities: ***Grossly normal range of motion.  Warm, well perfused.  No edema bilaterally. Skin: ***No rashes or lesions noted. Lymphatics: ***No cervical, supraclavicular, or inguinal adenopathy. GU: Normal appearing external genitalia without erythema, excoriation, or lesions.  Speculum exam reveals ***.  Bimanual exam reveals ***.  ***Rectovaginal exam  confirms ___.  Laboratory & Radiologic Studies: ***  Assessment & Plan: Amy Yoder is a 36 y.o. 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    Amy Yoder brought her aunt to today's appointment for a discussion about Lynch syndrome. I spent a good deal of time reviewing again her early-stage colon cancer diagnosis, the MLH1 gene mutation that she harbors, the other cancers that she is at risk for developing, and the screening that we will perform from a gyn cancer standpoint (uterine) until ready to move forward with risk-reducing surgery. We reviewed her EMB from May that was without hyperplasia or malignancy. We discussed symptoms (notably menstrual changes or intermenstrual bleeding) that should prompt a call to me sooner than her next scheduled visit.    In terms of surgery, we had previously discussed the NCCN recommendations for hysterectomy. Depending on when we move forward with hysterectomy, we could proceed with BS or BSO. If surgery occurs sooner, I would recommend a staged procedure and leaving her ovaries in situ with the plan to remove closer to age 18.    I spent about 15 minutes solely discussing weight loss and nutrition. While Amy Yoder continues to endorse making good food choices, she has yet to keep a food diary that we can review. She also continues to drink large quantities of Kool Aid and sweet tea. I suspect that she is consuming >1000 calories just with what she is drinking. I asked her to work on a food diary for the net 2 weeks and she will then review this with her aunt to identify one of two things that may be contributing to excess calories. I suspect that she would be able to start losing weight easily if she cut down or cut out her calorie-rich beverages. We discussed her calorie goal based on her current weight and I showed her several online tools and applications (Lose It! App which she can use for food diary entry) to help her stay on track with her goals.   *** minutes of total time was spent for this patient encounter, including preparation, face-to-face  counseling with the patient and coordination of care, and documentation of the encounter.  Jeral Pinch, MD  Division of Gynecologic Oncology  Department of Obstetrics and Gynecology  [facility]

## 2022-09-18 NOTE — Telephone Encounter (Signed)
Returned patient's call and rescheduled her appt from today to 10/24

## 2022-10-09 ENCOUNTER — Inpatient Hospital Stay: Payer: Medicaid Other | Attending: Gynecologic Oncology | Admitting: Gynecologic Oncology

## 2022-10-09 ENCOUNTER — Encounter: Payer: Self-pay | Admitting: Gynecologic Oncology

## 2022-10-09 ENCOUNTER — Inpatient Hospital Stay: Payer: Medicaid Other

## 2022-10-09 ENCOUNTER — Other Ambulatory Visit: Payer: Self-pay

## 2022-10-09 VITALS — BP 164/99 | HR 112 | Temp 98.5°F | Resp 18 | Wt 223.6 lb

## 2022-10-09 DIAGNOSIS — C189 Malignant neoplasm of colon, unspecified: Secondary | ICD-10-CM

## 2022-10-09 DIAGNOSIS — Z85038 Personal history of other malignant neoplasm of large intestine: Secondary | ICD-10-CM | POA: Insufficient documentation

## 2022-10-09 DIAGNOSIS — Z148 Genetic carrier of other disease: Secondary | ICD-10-CM | POA: Diagnosis not present

## 2022-10-09 DIAGNOSIS — Z1509 Genetic susceptibility to other malignant neoplasm: Secondary | ICD-10-CM

## 2022-10-09 DIAGNOSIS — E669 Obesity, unspecified: Secondary | ICD-10-CM

## 2022-10-09 LAB — PREGNANCY, URINE: Preg Test, Ur: NEGATIVE

## 2022-10-09 NOTE — Patient Instructions (Signed)
I will call you with your biopsy results later this week.  Please call the gastroenterologist that you saw.  This was Dr. Benson Norway.  His office number is [phone]. You are overdue for colon cancer screening.

## 2022-10-09 NOTE — Progress Notes (Signed)
Gynecologic Oncology Return Clinic Visit  10/09/22  Reason for Visit: screening in the setting of Lynch syndrome  Treatment History: Oncology History  Colon cancer ([place])  02/02/2020 Initial Diagnosis   Colon cancer ([place])   03/10/2020 Genetic Testing   Positive genetic testing:  A single pathogenic variant was detected in the MLH1 gene, called c.1381A>T (p.Lys461*), through the [facility] Common Hereditary Cancers panel. A variant of uncertain significance was also detected in the MEN1 gene, called c.511C>T (p.Arg171Trp). The report date is 03/10/2020.  The Common Hereditary Cancers Panel offered by [facility] includes sequencing and/or deletion duplication testing of the following 48 genes: APC, ATM, AXIN2, BARD1, BMPR1A, BRCA1, BRCA2, BRIP1, CDH1, CDK4, CDKN2A (p14ARF), CDKN2A (p16INK4a), CHEK2, CTNNA1, DICER1, EPCAM (Deletion/duplication testing only), GREM1 (promoter region deletion/duplication testing only), KIT, MEN1, MLH1, MSH2, MSH3, MSH6, MUTYH, NBN, NF1, NHTL1, PALB2, PDGFRA, PMS2, POLD1, POLE, PTEN, RAD50, RAD51C, RAD51D, RNF43, SDHB, SDHC, SDHD, SMAD4, SMARCA4. STK11, TP53, TSC1, TSC2, and VHL.  The following genes were evaluated for sequence changes only: SDHA and HOXB13 c.251G>A variant only.     Interval History: She has lost weight although states this has not been in a healthy manner but rather due to stress.  She is currently living with her sister, has been very stressful.  Her sister abuses alcohol and has been hospitalized 3 times recently for pancreatitis.  Currently on her menses.  Denies any change to menses in terms of frequency or quantity.  Denies any intermenstrual bleeding.  Reports regular bowel function.  Has not had a colonoscopy since I saw her last.  Last was in January 2021.  Past Medical/Surgical History: Past Medical History:  Diagnosis Date   Bipolar I disorder ([place])    Bronchitis    Colon cancer ([place])    Depression    Family history of brain cancer     Family history of colon cancer    Family history of lung cancer    Family history of non-Hodgkin's lymphoma    Family history of stomach cancer    Hypertension 02/15/2019   Lynch syndrome    Marijuana use    Symptomatic cholelithiasis s/p lap chole 03/12/2014 03/11/2014   Tobacco use     Past Surgical History:  Procedure Laterality Date   CHOLECYSTECTOMY N/A 03/12/2014   Procedure: LAPAROSCOPIC CHOLECYSTECTOMY WITH INTRAOPERATIVE CHOLANGIOGRAM;  Surgeon: Merrie Roof, MD;  [facility];  Service: General;  Laterality: N/A;   [place]     x2 for EAB   FLEXIBLE SIGMOIDOSCOPY N/A 01/08/2020   Procedure: FLEXIBLE SIGMOIDOSCOPY;  Surgeon: Carol Ada, MD;  [facility];  Service: Endoscopy;  Laterality: N/A;   HEMOSTASIS CLIP PLACEMENT  01/08/2020   Procedure: HEMOSTASIS CLIP PLACEMENT;  Surgeon: Carol Ada, MD;  [facility];  Service: Endoscopy;;   POLYPECTOMY  01/08/2020   Procedure: POLYPECTOMY;  Surgeon: Carol Ada, MD;  [facility];  Service: Endoscopy;;   SCLEROTHERAPY  01/08/2020   Procedure: Clide Deutscher;  Surgeon: Carol Ada, MD;  [facility];  Service: Endoscopy;;   SUBMUCOSAL TATTOO INJECTION  01/08/2020   Procedure: SUBMUCOSAL TATTOO INJECTION;  Surgeon: Carol Ada, MD;  [facility];  Service: Endoscopy;;    Family History  Problem Relation Age of Onset   Hypertension Mother    Non-Hodgkin's lymphoma Mother 71   Cirrhosis Maternal Grandfather        cirrhosis of the liver   Lung cancer Paternal Grandfather  dx. late 39s   Colon cancer Maternal Great-grandmother        dx. 12s   Brain cancer Other        dx. >50, unsure if primary or metastasis   Stomach cancer Other        dx. >50, unsure if primary or metastasis   Lung cancer Other        dx. >50, unsure if primary or metastasis   Breast cancer Neg Hx    Ovarian cancer Neg Hx    Endometrial cancer  Neg Hx    Pancreatic cancer Neg Hx    Prostate cancer Neg Hx     Social History   Socioeconomic History   Marital status: Single    Spouse name: Not on file   Number of children: Not on file   Years of education: Not on file   Highest education level: Not on file  Occupational History   Not on file  Tobacco Use   Smoking status: Every Day    Packs/day: 0.50    Types: Cigarettes   Smokeless tobacco: Never  Vaping Use   Vaping Use: Never used  Substance and Sexual Activity   Alcohol use: Yes    Comment: 2x/week   Drug use: Yes    Types: Marijuana    Comment: occasional Marijuana use   Sexual activity: Yes    Partners: Male    Birth control/protection: Condom  Other Topics Concern   Not on file  Social History Narrative   Not on file   Social Determinants of Health   Financial Resource Strain: Not on file  Food Insecurity: Not on file  Transportation Needs: Not on file  Physical Activity: Not on file  Stress: Not on file  Social Connections: Not on file    Current Medications:  Current Outpatient Medications:    atomoxetine (STRATTERA) 25 MG capsule, Take 25 mg by mouth every morning., Disp: , Rfl:    clonazePAM (KLONOPIN) 0.5 MG tablet, Take 0.5 mg by mouth daily., Disp: , Rfl:    gabapentin (NEURONTIN) 400 MG capsule, Take 400 mg by mouth 3 (three) times daily., Disp: , Rfl:    ibuprofen (ADVIL) 800 MG tablet, Take 800 mg by mouth 3 (three) times daily as needed., Disp: , Rfl:    metroNIDAZOLE (METROGEL) 0.75 % vaginal gel, Place vaginally at bedtime., Disp: , Rfl:    QUEtiapine (SEROQUEL) 400 MG tablet, Take 1 tablet (400 mg total) by mouth daily. Pt takes at bedtime with 20m for a total dose of 4528m(Patient taking differently: Take 400 mg by mouth at bedtime. Pt takes at bedtime with 5066mor a total dose of 450m53mDisp: 30 tablet, Rfl: 0   QUEtiapine (SEROQUEL) 50 MG tablet, Take 1 tablet (50 mg total) by mouth at bedtime. Pt takes at bedtime with 400mg58mblet for total dose of 450mg,33mp: 30 tablet, Rfl: 0   sertraline (ZOLOFT) 100 MG tablet, Take 2 tablets (200 mg total) by mouth daily., Disp: 60 tablet, Rfl: 0   VENTOLIN HFA 108 (90 Base) MCG/ACT inhaler, SMARTSIG:2 Puff(s) By Mouth Every 6 Hours, Disp: , Rfl:    Vitamin D, Ergocalciferol, (DRISDOL) 1.25 MG (50000 UNIT) CAPS capsule, Take 50,000 Units by mouth once a week., Disp: , Rfl:   Current Facility-Administered Medications:    cefTRIAXone (ROCEPHIN) injection 500 mg, 500 mg, Intramuscular, Once, HarperShelly BombardReview of Systems: + ringing in ears, hot flashes, vaginal discharge, joint pain, muscle  pain, numbness, depression, confusion. Denies appetite changes, fevers, chills, fatigue, unexplained weight changes. Denies hearing loss, neck lumps or masses, mouth sores, or voice changes. Denies cough or wheezing.  Denies shortness of breath. Denies chest pain or palpitations. Denies leg swelling. Denies abdominal distention, pain, blood in stools, constipation, diarrhea, nausea, vomiting, or early satiety. Denies pain with intercourse, dysuria, frequency, hematuria or incontinence. Denies  pelvic pain.   Denies itching, rash, or wounds. Denies dizziness, headaches or seizures. Denies swollen lymph nodes or glands, denies easy bruising or bleeding. Denies anxiety or decreased concentration.  Physical Exam: BP (!) 164/99 (BP Location: Left Arm, Patient Position: Sitting)   Pulse (!) 112   Temp 98.5 F (36.9 C) (Oral)   Resp 18   Wt 223 lb 9.6 oz (101.4 kg)   SpO2 99%   BMI 39.61 kg/m  General: Alert, oriented, no acute distress.  HEENT: Normocephalic, atraumatic. Sclera anicteric.  Chest: Clear to auscultation bilaterally. No wheezes, rhonchi, or rales. Cardiovascular: Regular rate and rhythm, no murmurs, rubs, or gallops.  Abdomen: Obese. Normoactive bowel sounds. Soft, nondistended, nontender to palpation. No masses or hepatosplenomegaly appreciated. No palpable  fluid wave.  Extremities: Grossly normal range of motion. Warm, well perfused. No edema bilaterally.  Skin: No rashes or lesions.  Lymphatics: No cervical, supraclavicular, or inguinal adenopathy.  GU: Normal appearing external genitalia without erythema, excoriation, or lesions.  Speculum exam reveals normal-appearing cervix, minimal blood within the vault.  Bimanual exam reveals mobile uterus.    Endometrial biopsy procedure Preoperative diagnosis: Lynch syndrome Postoperative diagnosis: Same as above Physician: Berline Lopes MD Estimated blood loss: Minimal Specimens: Endometrial biopsy Procedure: After the procedure was discussed with the patient including risks and benefits, she gave consent.  UPT was negative. She was then placed in dorsolithotomy position and a speculum was placed in the vagina.  Once the cervix was well visualized it was cleansed with Betadine x3.  An endometrial Pipelle was then passed to a depth of just under 8 cm.  1 pass was performed with adequate tissue obtained.  This was placed in formalin.  Overall the patient tolerated the procedure well.  All instruments were removed from the vagina.  Laboratory & Radiologic Studies: Negative UPT today  Assessment & Plan: Amy Yoder is a 36 y.o. 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  Last colonoscopy 2021. Last EMB 2021.   Patient is overall doing well without changes to her menses.  Given more than a 2-year interval since her last endometrial biopsy and her additional risk factors for endometrial cancer outside of Lynch syndrome, I recommended biopsy today.  This was performed without difficulty.  I will call her with the results.  We have previously reviewed in detail her early-stage colon cancer diagnosis, the MLH1 gene mutation that she harbors, the other cancers that she is at risk for developing, and the screening that we will perform from a gyn cancer standpoint  (uterine) until ready to move forward with risk-reducing surgery.   In terms of surgery, we discussed again the NCCN recommendations for hysterectomy. Depending on when we move forward with hysterectomy, we could proceed with BS or BSO. If surgery occurs sooner, we could either leave the ovaries in situ with plan for staged procedure or could remove them and plan to put her on hormone replacement therapy.  Patient is motivated to lose weight but would like to do so on a more healthy approach.  We discussed medications that  can be used for weight loss.  I encouraged her to reach out to her primary care provider to discuss these interventions further.   Patient is overdue for colon cancer screening.  She was given the name and clinic number for the gastroenterologist who did her last colonoscopy in 2021.  She would like to phone follow-up in 3 months to check in about her weight loss progress and her living situation.  We will discuss readiness for surgery at this time.  36 minutes of total time was spent for this patient encounter, including preparation, face-to-face counseling with the patient and coordination of care, and documentation of the encounter.  Jeral Pinch, MD  Division of Gynecologic Oncology  Department of Obstetrics and Gynecology  [facility]

## 2022-10-11 LAB — SURGICAL PATHOLOGY

## 2023-01-03 ENCOUNTER — Telehealth: Payer: Self-pay | Admitting: *Deleted

## 2023-01-03 NOTE — Telephone Encounter (Signed)
Per Dr Berline Lopes scheduled the patient for a phone visit on 1/31

## 2023-01-16 ENCOUNTER — Inpatient Hospital Stay: Payer: Medicaid Other | Attending: Gynecologic Oncology | Admitting: Gynecologic Oncology

## 2023-01-16 ENCOUNTER — Encounter: Payer: Self-pay | Admitting: Gynecologic Oncology

## 2023-01-16 DIAGNOSIS — Z85038 Personal history of other malignant neoplasm of large intestine: Secondary | ICD-10-CM

## 2023-01-16 DIAGNOSIS — E669 Obesity, unspecified: Secondary | ICD-10-CM | POA: Diagnosis not present

## 2023-01-16 DIAGNOSIS — Z1509 Genetic susceptibility to other malignant neoplasm: Secondary | ICD-10-CM

## 2023-01-16 NOTE — Progress Notes (Signed)
Gynecologic Oncology Telehealth Note: Gyn-Onc  I connected with Lunette Stands on 01/16/23 at  4:00 PM EST by telephone and verified that I am speaking with the correct person using two identifiers.  I discussed the limitations, risks, security and privacy concerns of performing an evaluation and management service by telemedicine and the availability of in-person appointments. I also discussed with the patient that there may be a patient responsible charge related to this service. The patient expressed understanding and agreed to proceed.  Other persons participating in the visit and their role in the encounter: none.  Patient's location: home Provider's location: [facility]  Reason for Visit: follow-up  Treatment History: Oncology History  Colon cancer ([place])  02/02/2020 Initial Diagnosis   Colon cancer ([place])   03/10/2020 Genetic Testing   Positive genetic testing:  A single pathogenic variant was detected in the MLH1 gene, called c.1381A>T (p.Lys461*), through the [facility] Common Hereditary Cancers panel. A variant of uncertain significance was also detected in the MEN1 gene, called c.511C>T (p.Arg171Trp). The report date is 03/10/2020.  The Common Hereditary Cancers Panel offered by [facility] includes sequencing and/or deletion duplication testing of the following 48 genes: APC, ATM, AXIN2, BARD1, BMPR1A, BRCA1, BRCA2, BRIP1, CDH1, CDK4, CDKN2A (p14ARF), CDKN2A (p16INK4a), CHEK2, CTNNA1, DICER1, EPCAM (Deletion/duplication testing only), GREM1 (promoter region deletion/duplication testing only), KIT, MEN1, MLH1, MSH2, MSH3, MSH6, MUTYH, NBN, NF1, NHTL1, PALB2, PDGFRA, PMS2, POLD1, POLE, PTEN, RAD50, RAD51C, RAD51D, RNF43, SDHB, SDHC, SDHD, SMAD4, SMARCA4. STK11, TP53, TSC1, TSC2, and VHL.  The following genes were evaluated for sequence changes only: SDHA and HOXB13 c.251G>A variant only.     Interval History: Down from 239 to 212 lbs. Doing a lot of walking, drinking more water.  Feeling much  better.  Past Medical/Surgical History: Past Medical History:  Diagnosis Date   Bipolar I disorder ([place])    Bronchitis    Colon cancer ([place])    Depression    Family history of brain cancer    Family history of colon cancer    Family history of lung cancer    Family history of non-Hodgkin's lymphoma    Family history of stomach cancer    Hypertension 02/15/2019   Lynch syndrome    Marijuana use    Symptomatic cholelithiasis s/p lap chole 03/12/2014 03/11/2014   Tobacco use     Past Surgical History:  Procedure Laterality Date   CHOLECYSTECTOMY N/A 03/12/2014   Procedure: LAPAROSCOPIC CHOLECYSTECTOMY WITH INTRAOPERATIVE CHOLANGIOGRAM;  Surgeon: Merrie Roof, MD;  [facility];  Service: General;  Laterality: N/A;   [place]     x2 for EAB   FLEXIBLE SIGMOIDOSCOPY N/A 01/08/2020   Procedure: FLEXIBLE SIGMOIDOSCOPY;  Surgeon: Carol Ada, MD;  [facility];  Service: Endoscopy;  Laterality: N/A;   HEMOSTASIS CLIP PLACEMENT  01/08/2020   Procedure: HEMOSTASIS CLIP PLACEMENT;  Surgeon: Carol Ada, MD;  [facility];  Service: Endoscopy;;   POLYPECTOMY  01/08/2020   Procedure: POLYPECTOMY;  Surgeon: Carol Ada, MD;  [facility];  Service: Endoscopy;;   SCLEROTHERAPY  01/08/2020   Procedure: Clide Deutscher;  Surgeon: Carol Ada, MD;  [facility];  Service: Endoscopy;;   SUBMUCOSAL TATTOO INJECTION  01/08/2020   Procedure: SUBMUCOSAL TATTOO INJECTION;  Surgeon: Carol Ada, MD;  [facility];  Service: Endoscopy;;    Family History  Problem Relation Age of Onset   Hypertension Mother    Non-Hodgkin's lymphoma Mother 75  Cirrhosis Maternal Grandfather        cirrhosis of the liver   Lung cancer Paternal Grandfather        dx. late 70s   Colon cancer Maternal Great-grandmother        dx. 14s   Brain cancer Other        dx. >50, unsure if primary or metastasis   Stomach  cancer Other        dx. >50, unsure if primary or metastasis   Lung cancer Other        dx. >50, unsure if primary or metastasis   Breast cancer Neg Hx    Ovarian cancer Neg Hx    Endometrial cancer Neg Hx    Pancreatic cancer Neg Hx    Prostate cancer Neg Hx     Social History   Socioeconomic History   Marital status: Single    Spouse name: Not on file   Number of children: Not on file   Years of education: Not on file   Highest education level: Not on file  Occupational History   Not on file  Tobacco Use   Smoking status: Every Day    Packs/day: 0.50    Types: Cigarettes   Smokeless tobacco: Never  Vaping Use   Vaping Use: Never used  Substance and Sexual Activity   Alcohol use: Yes    Comment: 2x/week   Drug use: Yes    Types: Marijuana    Comment: occasional Marijuana use   Sexual activity: Yes    Partners: Male    Birth control/protection: Condom  Other Topics Concern   Not on file  Social History Narrative   Not on file   Social Determinants of Health   Financial Resource Strain: Not on file  Food Insecurity: Not on file  Transportation Needs: Not on file  Physical Activity: Not on file  Stress: Not on file  Social Connections: Not on file    Current Medications:  Current Outpatient Medications:    atomoxetine (STRATTERA) 25 MG capsule, Take 25 mg by mouth every morning., Disp: , Rfl:    clonazePAM (KLONOPIN) 0.5 MG tablet, Take 0.5 mg by mouth daily., Disp: , Rfl:    gabapentin (NEURONTIN) 400 MG capsule, Take 400 mg by mouth 3 (three) times daily., Disp: , Rfl:    ibuprofen (ADVIL) 800 MG tablet, Take 800 mg by mouth 3 (three) times daily as needed., Disp: , Rfl:    metroNIDAZOLE (METROGEL) 0.75 % vaginal gel, Place vaginally at bedtime., Disp: , Rfl:    QUEtiapine (SEROQUEL) 400 MG tablet, Take 1 tablet (400 mg total) by mouth daily. Pt takes at bedtime with '50mg'$  for a total dose of '450mg'$  (Patient taking differently: Take 400 mg by mouth at  bedtime. Pt takes at bedtime with '50mg'$  for a total dose of '450mg'$ ), Disp: 30 tablet, Rfl: 0   QUEtiapine (SEROQUEL) 50 MG tablet, Take 1 tablet (50 mg total) by mouth at bedtime. Pt takes at bedtime with '400mg'$  tablet for total dose of '450mg'$ , Disp: 30 tablet, Rfl: 0   sertraline (ZOLOFT) 100 MG tablet, Take 2 tablets (200 mg total) by mouth daily., Disp: 60 tablet, Rfl: 0   VENTOLIN HFA 108 (90 Base) MCG/ACT inhaler, SMARTSIG:2 Puff(s) By Mouth Every 6 Hours, Disp: , Rfl:    Vitamin D, Ergocalciferol, (DRISDOL) 1.25 MG (50000 UNIT) CAPS capsule, Take 50,000 Units by mouth once a week., Disp: , Rfl:   Current Facility-Administered Medications:    cefTRIAXone (  ROCEPHIN) injection 500 mg, 500 mg, Intramuscular, Once, Shelly Bombard, MD  Review of Symptoms: Pertinent positives as per HPI.  Physical Exam: Deferred given limitations of phone visit.  Laboratory & Radiologic Studies: 10/09/22: A. ENDOMETRIAL BIOPSY:  - Mixed phase endometrium with extensive stromal breakdown.  - Negative for hyperplasia or malignancy.   Assessment & Plan: TEYA OTTERSON is a 37 y.o. 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  Last colonoscopy 2021. Last EMB 2023.  Doing well.  Is very happy about the weight loss that she has been able to achieve thus far.  I congratulated her on this.  She is thinking that she would like to proceed with risk-reducing surgery in the next several months.  We discussed late March or early April for risk-reducing surgery in the setting of her Lynch syndrome.  She will think a little bit about whether she would like her ovaries removed at the time of surgery.  At her last visit, we discussed that she is overdue for colonoscopy.  She reached out to the GI provider where she had her last colonoscopy but they no longer take her insurance.  They were working to help get her referred to a practice that does.  She is still in the process of  trying to get this scheduled.  I discussed the assessment and treatment plan with the patient. The patient was provided with an opportunity to ask questions and all were answered. The patient agreed with the plan and demonstrated an understanding of the instructions.   The patient was advised to call back or see an in-person evaluation if the symptoms worsen or if the condition fails to improve as anticipated.   8 minutes of total time was spent for this patient encounter, including preparation, phone counseling with the patient and coordination of care, and documentation of the encounter.   Jeral Pinch, MD  Division of Gynecologic Oncology  Department of Obstetrics and Gynecology  [facility]

## 2023-01-17 ENCOUNTER — Telehealth: Payer: Self-pay | Admitting: *Deleted

## 2023-01-17 NOTE — Telephone Encounter (Signed)
Per Dr Berline Lopes attempted to reach to the patient to schedule appts for pre op appts, no answer and unable to leave message. Sent the patient a my chart message.

## 2023-01-18 ENCOUNTER — Telehealth: Payer: Self-pay | Admitting: *Deleted

## 2023-01-18 NOTE — Telephone Encounter (Signed)
Call to patient to review surgery date options. Left message to call back.  My Chart message sent.

## 2023-01-21 ENCOUNTER — Telehealth: Payer: Self-pay | Admitting: Oncology

## 2023-01-21 ENCOUNTER — Telehealth: Payer: Self-pay | Admitting: Surgery

## 2023-01-21 NOTE — Telephone Encounter (Signed)
Entered in error

## 2023-01-21 NOTE — Telephone Encounter (Signed)
Called patient to review potential surgery dates. Patient stated she would like to do surgery April 3rd. Confirmed this with patient and advised her that she will still need to attend her appt with Dr Berline Lopes and pre-op with Joylene John on 03/08/23. Patient verbalized understanding and had no other concerns at this time.

## 2023-01-21 NOTE — Telephone Encounter (Signed)
Called [place] and discussed that Dr. Berline Lopes would like her to have a colonoscopy before her surgery and asked if she has an appointment scheduled yet.  She is going to call her PCP today to see if they have scheduled an appointment for her.  Gave her my contact information in case she needs any assistance with getting an appointment.

## 2023-01-22 ENCOUNTER — Other Ambulatory Visit: Payer: Self-pay | Admitting: Gynecologic Oncology

## 2023-01-22 DIAGNOSIS — Z1509 Genetic susceptibility to other malignant neoplasm: Secondary | ICD-10-CM

## 2023-01-30 ENCOUNTER — Telehealth: Payer: Self-pay | Admitting: Oncology

## 2023-01-30 DIAGNOSIS — C189 Malignant neoplasm of colon, unspecified: Secondary | ICD-10-CM

## 2023-01-30 DIAGNOSIS — Z1509 Genetic susceptibility to other malignant neoplasm: Secondary | ICD-10-CM

## 2023-01-30 NOTE — Telephone Encounter (Signed)
Called Amy Yoder and she has not been able to schedule an appointment yet for a colonoscopy.  She said her PCP has sent in a referral to [facility] but she has not heard anything.  Called [facility] and they have not received a referral for Amy Yoder.  Placed urgent referral to schedule a colonoscopy before surgery on 03/19/23.

## 2023-02-13 ENCOUNTER — Telehealth: Payer: Self-pay | Admitting: Gastroenterology

## 2023-02-13 NOTE — Telephone Encounter (Signed)
Good afternoon Dr. Candis Schatz,   Supervising Provider 02/13/23 PM  We received a referral for a colonoscopy and evaluation of Lynch syndrome. This patient, has history with Dr. Benson Norway at [facility]. Patient had a colonoscopy in 2022, however patient states Dr. Ulyses Amor office no longer accepts their insurance and would like to establish care with [place]. Records are being sent up for your review. Please advise on scheduling.    Thank you.

## 2023-02-21 NOTE — Telephone Encounter (Signed)
Records from Dr. Ulyses Amor office reviewed:  Patient initially presented to Dr. Ulyses Amor office Dec 21, 2019 with complaints of hematochezia. She was noted to have a family history of colon cancer (PGF), brain cancer (maternal uncle), breast cancer (maternal aunt), lymphoma (mother). She underwent a colonoscopy in which a large polyp in the distal sigmoid colon was removed piecemeal and was found to have adenocarcinoma with submucosal invasion.  She was found to have an MLH1 mutation consistent with Lynch syndrome. She was recommended to undergo surgery (sigmoidectomy vs subtotal colectomy) but she declined.    The last note from Dr. Benson Norway was Feb 2022.  Her last colonoscopy was Feb 2022 which was normal (tattoo and polypectomy scar noted.)  It appears that she is scheduled for a preventative hysterectomy/oopherectomy in April.  It does not appear she ever proceeded with a colectomy.  Ok to transfer care to me. The patient can be scheduled for a direct colonoscopy as she is overdue; if possible, prior to her surgery.  If unable to do colonoscopy prior to surgery, will need to wait at least 6 weeks after surgery to allow time to recover/heal    Colonoscopy: Dec 31, 2019 Indication:  Hematochezia 35 mm polyp in rectosigmoid colon, semipedunculated Not resected; repeat colonoscopy scheduled for hospital setting  Flexible sigmoidoscopy: January 08, 2020 Indication:  Hematochezia 35 mm polyp in sigmoid colon, semi-pedunculated.  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  Flexible Sigmoidoscopy: Oct 11, 2020 Tattoo with post-polypectomy scar in sigmoid colon, no residual polyp  Colonoscopy:  Feb 09, 2021 Tattoo and scar in sigmoid colon with no residual polyp tissue Recommended to undergo colectomy/repeat colonoscopy in 1 year

## 2023-02-26 ENCOUNTER — Telehealth: Payer: Self-pay

## 2023-02-26 NOTE — Telephone Encounter (Signed)
That sounds great - no mandatory, but would be nice to have it done before just in case they find anything on the exam that needs surgical treatment

## 2023-02-26 NOTE — Telephone Encounter (Signed)
If the patient doesn't mind, it would be preferable. So either let's do that or we can get her colonoscopy scheduled for 6+ weeks after surgery if she is not ok moving the date a few weeks. Thank you

## 2023-02-26 NOTE — Telephone Encounter (Signed)
Wonderful - thanks for coordinating Amy Yoder

## 2023-02-26 NOTE — Telephone Encounter (Signed)
Called [facility] and a colonoscopy with Dr. Candis Schatz has been scheduled for 03/19/23 at 11:30 with a previsit telephone call with the nurse on 03/01/23 at 3:30. Called [facility] and advised her of the previsit and colonoscopy appointments.    Discussed that we can reschedule her surgery with Dr. Berline Lopes to 04/04/23 so that she can have the colonoscopy before surgery or keep surgery on 03/20/23 and reschedule the colonoscopy for 6 weeks latter.  Lorelai said she prefers having the colonoscopy first on 03/19/23 and moving the surgery to 04/04/23.  Advised her that we will reschedule the surgery date to 04/04/23 and reviewed  all her upcoming appointments on 03/08/23. She verbalized understanding and agreement.

## 2023-02-26 NOTE — Telephone Encounter (Signed)
Pt called office stating she is having surgery with Dr. Berline Lopes on 4/3. At her last visit Dr. Berline Lopes mentioned putting in a referral for a Colonoscopy before her surgery. (Referral has been placed)  She states they told her they could not get her in until May. Will her surgery have to be rescheduled?   Elmo Putt RN,  Nurse navigator notified. She has been following this referral

## 2023-03-01 ENCOUNTER — Telehealth: Payer: Self-pay | Admitting: *Deleted

## 2023-03-01 NOTE — Telephone Encounter (Signed)
Mutiple attempts made to complete PV. Unable to reach patient. VM is not set up unable to leave a message. Pt to call the office back by 5 PM to have her PV rescheduled. In the event that we do not hear back from them their PV and scheduled procedure will be cancelled and pt will be notified by no show letter.

## 2023-03-01 NOTE — Telephone Encounter (Signed)
Attempt to reach pt for pre-visit appt. Was not able to reach pt and voice mail is not  set up

## 2023-03-05 ENCOUNTER — Telehealth: Payer: Self-pay | Admitting: Oncology

## 2023-03-05 NOTE — Telephone Encounter (Signed)
Called Amy Yoder about her colonoscopy being canceled.  She said she is in the process of getting it rescheduled and will call back once she has the date.

## 2023-03-06 ENCOUNTER — Telehealth: Payer: Self-pay | Admitting: Physician Assistant

## 2023-03-06 NOTE — Telephone Encounter (Signed)
error 

## 2023-03-08 ENCOUNTER — Encounter (HOSPITAL_COMMUNITY): Payer: Self-pay

## 2023-03-08 ENCOUNTER — Telehealth: Payer: Self-pay | Admitting: Oncology

## 2023-03-08 ENCOUNTER — Inpatient Hospital Stay: Payer: Medicaid Other

## 2023-03-08 ENCOUNTER — Telehealth: Payer: Self-pay | Admitting: Surgery

## 2023-03-08 ENCOUNTER — Encounter (HOSPITAL_COMMUNITY): Admission: RE | Admit: 2023-03-08 | Payer: Medicaid Other | Source: Ambulatory Visit

## 2023-03-08 ENCOUNTER — Inpatient Hospital Stay: Payer: Medicaid Other | Admitting: Gynecologic Oncology

## 2023-03-08 DIAGNOSIS — Z1509 Genetic susceptibility to other malignant neoplasm: Secondary | ICD-10-CM

## 2023-03-08 NOTE — Telephone Encounter (Signed)
Called patient and scheduled appt with Dr Berline Lopes and pre-op with [facility] for 05/09/23 so that it is after her colonoscopy on 5/16. Patient is now scheduled for surgery on 5/30. Patient aware of all upcoming appointments and had no other concerns at this time.

## 2023-03-08 NOTE — Telephone Encounter (Signed)
Called Amy Yoder and advised we are going to reschedule surgery after her colonoscopy on 05/02/23 so we need to cancel her appointment with Dr. Berline Lopes today.  Advised we will call her back with an appointment closer to her new surgery date.  She verbalized understanding and agreement.

## 2023-03-08 NOTE — Progress Notes (Unsigned)
Gynecologic Oncology Return Clinic Visit  03/08/23  Reason for Visit: treatment planning  Treatment History: Oncology History  Colon cancer ([place])  02/02/2020 Initial Diagnosis   Colon cancer ([place])   03/10/2020 Genetic Testing   Positive genetic testing:  A single pathogenic variant was detected in the MLH1 gene, called c.1381A>T (p.Lys461*), through the [facility] Common Hereditary Cancers panel. A variant of uncertain significance was also detected in the MEN1 gene, called c.511C>T (p.Arg171Trp). The report date is 03/10/2020.  The Common Hereditary Cancers Panel offered by [facility] includes sequencing and/or deletion duplication testing of the following 48 genes: APC, ATM, AXIN2, BARD1, BMPR1A, BRCA1, BRCA2, BRIP1, CDH1, CDK4, CDKN2A (p14ARF), CDKN2A (p16INK4a), CHEK2, CTNNA1, DICER1, EPCAM (Deletion/duplication testing only), GREM1 (promoter region deletion/duplication testing only), KIT, MEN1, MLH1, MSH2, MSH3, MSH6, MUTYH, NBN, NF1, NHTL1, PALB2, PDGFRA, PMS2, POLD1, POLE, PTEN, RAD50, RAD51C, RAD51D, RNF43, SDHB, SDHC, SDHD, SMAD4, SMARCA4. STK11, TP53, TSC1, TSC2, and VHL.  The following genes were evaluated for sequence changes only: SDHA and HOXB13 c.251G>A variant only.     Interval History: ***  Past Medical/Surgical History: Past Medical History:  Diagnosis Date   Bipolar I disorder ([place])    Bronchitis    Colon cancer ([place])    Depression    Family history of brain cancer    Family history of colon cancer    Family history of lung cancer    Family history of non-Hodgkin's lymphoma    Family history of stomach cancer    Hypertension 02/15/2019   Lynch syndrome    Marijuana use    Symptomatic cholelithiasis s/p lap chole 03/12/2014 03/11/2014   Tobacco use     Past Surgical History:  Procedure Laterality Date   CHOLECYSTECTOMY N/A 03/12/2014   Procedure: LAPAROSCOPIC CHOLECYSTECTOMY WITH INTRAOPERATIVE CHOLANGIOGRAM;  Surgeon: Merrie Roof, MD;  Location: [facility];  Service:  General;  Laterality: N/A;   [place]     x2 for EAB   FLEXIBLE SIGMOIDOSCOPY N/A 01/08/2020   Procedure: FLEXIBLE SIGMOIDOSCOPY;  Surgeon: Carol Ada, MD;  Location: [facility];  Service: Endoscopy;  Laterality: N/A;   HEMOSTASIS CLIP PLACEMENT  01/08/2020   Procedure: HEMOSTASIS CLIP PLACEMENT;  Surgeon: Carol Ada, MD;  Location: [facility];  Service: Endoscopy;;   POLYPECTOMY  01/08/2020   Procedure: POLYPECTOMY;  Surgeon: Carol Ada, MD;  Location: [facility];  Service: Endoscopy;;   SCLEROTHERAPY  01/08/2020   Procedure: Clide Deutscher;  Surgeon: Carol Ada, MD;  Location: [facility];  Service: Endoscopy;;   SUBMUCOSAL TATTOO INJECTION  01/08/2020   Procedure: SUBMUCOSAL TATTOO INJECTION;  Surgeon: Carol Ada, MD;  Location: [facility];  Service: Endoscopy;;    Family History  Problem Relation Age of Onset   Hypertension Mother    Non-Hodgkin's lymphoma Mother 70   Cirrhosis Maternal Grandfather        cirrhosis of the liver   Lung cancer Paternal Grandfather        dx. late 34s   Colon cancer Maternal Great-grandmother        dx. 29s   Brain cancer Other        dx. >50, unsure if primary or metastasis   Stomach cancer Other        dx. >50, unsure if primary or metastasis   Lung cancer Other        dx. >50, unsure if primary or metastasis   Breast cancer Neg Hx    Ovarian cancer Neg Hx  Endometrial cancer Neg Hx    Pancreatic cancer Neg Hx    Prostate cancer Neg Hx     Social History   Socioeconomic History   Marital status: Single    Spouse name: Not on file   Number of children: Not on file   Years of education: Not on file   Highest education level: Not on file  Occupational History   Not on file  Tobacco Use   Smoking status: Every Day    Packs/day: .5    Types: Cigarettes   Smokeless tobacco: Never  Vaping Use   Vaping Use: Never used  Substance and Sexual Activity   Alcohol use: Yes     Comment: 2x/week   Drug use: Yes    Types: Marijuana    Comment: occasional Marijuana use   Sexual activity: Yes    Partners: Male    Birth control/protection: Condom  Other Topics Concern   Not on file  Social History Narrative   Not on file   Social Determinants of Health   Financial Resource Strain: Not on file  Food Insecurity: Not on file  Transportation Needs: Not on file  Physical Activity: Not on file  Stress: Not on file  Social Connections: Not on file    Current Medications:  Current Outpatient Medications:    atomoxetine (STRATTERA) 25 MG capsule, Take 25 mg by mouth every morning., Disp: , Rfl:    clonazePAM (KLONOPIN) 0.5 MG tablet, Take 0.5 mg by mouth daily., Disp: , Rfl:    gabapentin (NEURONTIN) 400 MG capsule, Take 400 mg by mouth 3 (three) times daily., Disp: , Rfl:    ibuprofen (ADVIL) 800 MG tablet, Take 800 mg by mouth 3 (three) times daily as needed., Disp: , Rfl:    metroNIDAZOLE (METROGEL) 0.75 % vaginal gel, Place vaginally at bedtime., Disp: , Rfl:    QUEtiapine (SEROQUEL) 400 MG tablet, Take 1 tablet (400 mg total) by mouth daily. Pt takes at bedtime with 50mg  for a total dose of 450mg  (Patient taking differently: Take 400 mg by mouth at bedtime. Pt takes at bedtime with 50mg  for a total dose of 450mg ), Disp: 30 tablet, Rfl: 0   QUEtiapine (SEROQUEL) 50 MG tablet, Take 1 tablet (50 mg total) by mouth at bedtime. Pt takes at bedtime with 400mg  tablet for total dose of 450mg , Disp: 30 tablet, Rfl: 0   sertraline (ZOLOFT) 100 MG tablet, Take 2 tablets (200 mg total) by mouth daily., Disp: 60 tablet, Rfl: 0   VENTOLIN HFA 108 (90 Base) MCG/ACT inhaler, SMARTSIG:2 Puff(s) By Mouth Every 6 Hours, Disp: , Rfl:    Vitamin D, Ergocalciferol, (DRISDOL) 1.25 MG (50000 UNIT) CAPS capsule, Take 50,000 Units by mouth once a week., Disp: , Rfl:   Current Facility-Administered Medications:    cefTRIAXone (ROCEPHIN) injection 500 mg, 500 mg, Intramuscular, Once,  Shelly Bombard, MD  Review of Systems: Denies appetite changes, fevers, chills, fatigue, unexplained weight changes. Denies hearing loss, neck lumps or masses, mouth sores, ringing in ears or voice changes. Denies cough or wheezing.  Denies shortness of breath. Denies chest pain or palpitations. Denies leg swelling. Denies abdominal distention, pain, blood in stools, constipation, diarrhea, nausea, vomiting, or early satiety. Denies pain with intercourse, dysuria, frequency, hematuria or incontinence. Denies hot flashes, pelvic pain, vaginal bleeding or vaginal discharge.   Denies joint pain, back pain or muscle pain/cramps. Denies itching, rash, or wounds. Denies dizziness, headaches, numbness or seizures. Denies swollen lymph nodes or glands, denies  easy bruising or bleeding. Denies anxiety, depression, confusion, or decreased concentration.  Physical Exam: There were no vitals taken for this visit. General: ***Alert, oriented, no acute distress. HEENT: ***Posterior oropharynx clear, sclera anicteric. Chest: ***Clear to auscultation bilaterally.  ***Port site clean. Cardiovascular: ***Regular rate and rhythm, no murmurs. Abdomen: ***Obese, soft, nontender.  Normoactive bowel sounds.  No masses or hepatosplenomegaly appreciated.  ***Well-healed scar. Extremities: ***Grossly normal range of motion.  Warm, well perfused.  No edema bilaterally. Skin: ***No rashes or lesions noted. Lymphatics: ***No cervical, supraclavicular, or inguinal adenopathy. GU: Normal appearing external genitalia without erythema, excoriation, or lesions.  Speculum exam reveals ***.  Bimanual exam reveals ***.  ***Rectovaginal exam  confirms ___.  Laboratory & Radiologic Studies: ***  Assessment & Plan: Amy Yoder is a 37 y.o. 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  Last colonoscopy 2021, scheduled for one in early April. Last EMB 09/2022  showed mixed phase endometrium, no hyperplasia or malignancy.  ***  A long discussion was held with the patient regarding the rationale for prophylactic removal of the uterus, fallopian tubes and ovaries. We discussed that individuals with Lynch syndrome have a lifetime risk of uterine cancer and ovarian cancer. There is no effective screening test for ovarian/fallopian tube cancers and most cases are fatal due to presentation at an advanced stage. In view of this, prophylactic surgery is recommended when carriers have completed childbearing. Removal of the uterus eliminates the risk of uterine cancer. Removal of the fallopian tubes and ovaries reduces this risk dramatically, but not completely because some of these cancers arise outside the adnexa. Women with Lynch syndrome also have increased risks for stomach, small bowel, urinary tract, and brain cancers. She understands the need for appropriate cancer screening.    We discussed Ms. Cutright's Lynch syndrome and specifically her MLH1 tumor suppressor mutation. We discussed that her mutation-specific risk of endometrial cancer is between 34-54% (general population 2.7%). These are usually early stage with median age of 40. The mutation-specific risk of ovarian cancer is between 4-20% (general population 1.3%) with median age of 57.   There is limited data on efficacy of surveillance to reduce EC or OC risk in Lynch syndrome. We recommend annual endometrial sampling, TVUS, and CA-125 beginning at age 54, consistent with national guidelines. Once childbearing is complete or age 20-45, we recommend risk-reducing hysterectomy and BSO rather than screening.  The patient endorses completion of childbearing.  In that case, I think it would be reasonable to proceed with total hysterectomy and bilateral salpingectomy in the near future.  I would recommend delaying bilateral oophorectomy until the patient is 49-66 years old given risks associated with surgical  menopause.  *** minutes of total time was spent for this patient encounter, including preparation, face-to-face counseling with the patient and coordination of care, and documentation of the encounter.  Jeral Pinch, MD  Division of Gynecologic Oncology  Department of Obstetrics and Gynecology  [facility]

## 2023-03-19 ENCOUNTER — Encounter: Payer: Medicaid Other | Admitting: Gastroenterology

## 2023-03-20 DIAGNOSIS — Z1509 Genetic susceptibility to other malignant neoplasm: Secondary | ICD-10-CM

## 2023-04-03 DIAGNOSIS — K625 Hemorrhage of anus and rectum: Secondary | ICD-10-CM | POA: Insufficient documentation

## 2023-04-03 DIAGNOSIS — F1410 Cocaine abuse, uncomplicated: Secondary | ICD-10-CM | POA: Insufficient documentation

## 2023-04-03 DIAGNOSIS — Z85038 Personal history of other malignant neoplasm of large intestine: Secondary | ICD-10-CM | POA: Insufficient documentation

## 2023-04-03 DIAGNOSIS — Z800 Family history of malignant neoplasm of digestive organs: Secondary | ICD-10-CM | POA: Insufficient documentation

## 2023-04-03 DIAGNOSIS — E6609 Other obesity due to excess calories: Secondary | ICD-10-CM | POA: Insufficient documentation

## 2023-04-04 DIAGNOSIS — Z1509 Genetic susceptibility to other malignant neoplasm: Secondary | ICD-10-CM

## 2023-04-11 ENCOUNTER — Telehealth: Payer: Self-pay

## 2023-04-11 NOTE — Telephone Encounter (Signed)
No Show PV.  No Answer, No Voicemail.  No show letter sent in [place] and in mail.

## 2023-04-11 NOTE — Telephone Encounter (Signed)
Called patient several times.  Could not leave a message, voicemail not set up.

## 2023-05-02 ENCOUNTER — Encounter: Payer: Medicaid Other | Admitting: Gastroenterology

## 2023-05-08 ENCOUNTER — Telehealth: Payer: Self-pay

## 2023-05-08 NOTE — Telephone Encounter (Signed)
Called pt for meaningful use questions for f/u appt on 05/09/2023 with Dr. Pricilla Holm and Pre-op with Efraim Kaufmann, NP.  Pt stated that she is in a rough spot now and needs to get on her feet before she has surgery.  She stated that she just recently got a job but has no place to stay right now.  Pt wants to move surgery out to August.  Appt made with Dr. Pricilla Holm on 07/19/2023 @ 3 pm. Informed pt to call back with any concerns or questions.

## 2023-05-09 ENCOUNTER — Inpatient Hospital Stay: Payer: Medicaid Other | Admitting: Gynecologic Oncology

## 2023-05-10 ENCOUNTER — Encounter (HOSPITAL_COMMUNITY): Admission: RE | Admit: 2023-05-10 | Payer: Medicaid Other | Source: Ambulatory Visit

## 2023-05-14 ENCOUNTER — Ambulatory Visit: Payer: Self-pay

## 2023-05-16 ENCOUNTER — Ambulatory Visit: Admit: 2023-05-16 | Payer: Medicaid Other | Admitting: Gynecologic Oncology

## 2023-05-16 DIAGNOSIS — Z1509 Genetic susceptibility to other malignant neoplasm: Secondary | ICD-10-CM

## 2023-05-16 SURGERY — XI ROBOTIC ASSISTED LAPAROSCOPIC HYSTERECTOMY AND SALPINGECTOMY
Anesthesia: General

## 2023-07-19 ENCOUNTER — Inpatient Hospital Stay: Payer: Medicaid Other | Attending: Gynecologic Oncology | Admitting: Gynecologic Oncology

## 2023-07-19 DIAGNOSIS — Z1509 Genetic susceptibility to other malignant neoplasm: Secondary | ICD-10-CM

## 2023-10-16 ENCOUNTER — Telehealth: Payer: Self-pay

## 2023-10-16 NOTE — Telephone Encounter (Signed)
Pt called office stating she needs to make a follow up appointment with Dr. Pricilla Holm. She states she isn't even sure what she needs to be doing at this point. She states she has "not done anything Dr. Pricilla Holm has told me to do" referring to following up with her GI doctor for a colonoscopy.  Pt aware first available with Dr.Tucker isn't until January 2025. She is scheduled for 12/20/23 @ 2:15. Pt agrees to date/time. I encouraged her to call her GI doctor and get scheduled for a colonoscopy before her follow up with Dr.Tucker. She voiced an understanding.

## 2023-12-20 ENCOUNTER — Inpatient Hospital Stay: Payer: Medicaid Other | Attending: Gynecologic Oncology | Admitting: Gynecologic Oncology

## 2023-12-20 DIAGNOSIS — Z1509 Genetic susceptibility to other malignant neoplasm: Secondary | ICD-10-CM

## 2024-04-28 ENCOUNTER — Encounter (INDEPENDENT_AMBULATORY_CARE_PROVIDER_SITE_OTHER): Payer: Self-pay

## 2024-06-15 ENCOUNTER — Encounter (INDEPENDENT_AMBULATORY_CARE_PROVIDER_SITE_OTHER): Payer: Self-pay

## 2024-10-02 ENCOUNTER — Ambulatory Visit: Admitting: Obstetrics

## 2024-12-14 ENCOUNTER — Other Ambulatory Visit: Payer: Self-pay | Admitting: Physician Assistant

## 2024-12-14 DIAGNOSIS — Z1231 Encounter for screening mammogram for malignant neoplasm of breast: Secondary | ICD-10-CM

## 2024-12-22 ENCOUNTER — Inpatient Hospital Stay
Admission: RE | Admit: 2024-12-22 | Discharge: 2024-12-22 | Attending: Physician Assistant | Admitting: Physician Assistant

## 2024-12-22 DIAGNOSIS — Z1231 Encounter for screening mammogram for malignant neoplasm of breast: Secondary | ICD-10-CM

## 2024-12-25 ENCOUNTER — Other Ambulatory Visit: Payer: Self-pay | Admitting: Physician Assistant

## 2024-12-25 DIAGNOSIS — R928 Other abnormal and inconclusive findings on diagnostic imaging of breast: Secondary | ICD-10-CM

## 2025-01-06 ENCOUNTER — Other Ambulatory Visit

## 2025-01-06 ENCOUNTER — Inpatient Hospital Stay: Admission: RE | Admit: 2025-01-06 | Source: Ambulatory Visit

## 2025-01-26 ENCOUNTER — Encounter

## 2025-01-26 ENCOUNTER — Other Ambulatory Visit
